# Patient Record
Sex: Female | Born: 1980
Health system: Southern US, Community
[De-identification: ages and names within clinical notes are randomized; demographics above are authoritative.]

## PROBLEM LIST (undated history)

## (undated) DIAGNOSIS — M545 Low back pain, unspecified: Secondary | ICD-10-CM

## (undated) DIAGNOSIS — F419 Anxiety disorder, unspecified: Secondary | ICD-10-CM

## (undated) DIAGNOSIS — M2240 Chondromalacia patellae, unspecified knee: Secondary | ICD-10-CM

## (undated) DIAGNOSIS — G894 Chronic pain syndrome: Secondary | ICD-10-CM

## (undated) DIAGNOSIS — G8929 Other chronic pain: Secondary | ICD-10-CM

## (undated) DIAGNOSIS — Q762 Congenital spondylolisthesis: Secondary | ICD-10-CM

## (undated) DIAGNOSIS — M4306 Spondylolysis, lumbar region: Secondary | ICD-10-CM

## (undated) DIAGNOSIS — F32A Depression, unspecified: Secondary | ICD-10-CM

## (undated) DIAGNOSIS — F329 Major depressive disorder, single episode, unspecified: Secondary | ICD-10-CM

## (undated) DIAGNOSIS — R102 Pelvic and perineal pain: Secondary | ICD-10-CM

## (undated) DIAGNOSIS — R51 Headache: Secondary | ICD-10-CM

## (undated) DIAGNOSIS — M4802 Spinal stenosis, cervical region: Secondary | ICD-10-CM

## (undated) HISTORY — DX: Other chronic pain: G89.29

## (undated) HISTORY — DX: Chondromalacia patellae, unspecified knee: M22.40

## (undated) HISTORY — DX: Spondylolysis, lumbar region: M43.06

## (undated) HISTORY — DX: Anxiety disorder, unspecified: F41.9

## (undated) HISTORY — DX: Headache: R51

## (undated) HISTORY — DX: Depression, unspecified: F32.A

## (undated) HISTORY — DX: Pelvic and perineal pain: R10.2

## (undated) HISTORY — PX: TUBAL LIGATION: SHX77

## (undated) HISTORY — DX: Major depressive disorder, single episode, unspecified: F32.9

## (undated) HISTORY — DX: Chronic pain syndrome: G89.4

## (undated) HISTORY — PX: MOUTH SURGERY: SHX715

## (undated) HISTORY — DX: Spinal stenosis, cervical region: M48.02

## (undated) HISTORY — DX: Low back pain: M54.5

## (undated) HISTORY — DX: Congenital spondylolisthesis: Q76.2

## (undated) HISTORY — PX: TONSILLECTOMY: SUR1361

## (undated) HISTORY — DX: Low back pain, unspecified: M54.50

---
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
## 1999-02-17 ENCOUNTER — Encounter: Admission: RE | Admit: 1999-02-17 | Discharge: 1999-02-17 | Payer: Self-pay | Admitting: Family Medicine

## 1999-03-03 ENCOUNTER — Other Ambulatory Visit: Admission: RE | Admit: 1999-03-03 | Discharge: 1999-03-03 | Payer: Self-pay | Admitting: Family Medicine

## 1999-03-03 ENCOUNTER — Encounter: Admission: RE | Admit: 1999-03-03 | Discharge: 1999-03-03 | Payer: Self-pay | Admitting: Pediatrics

## 1999-09-11 ENCOUNTER — Emergency Department (HOSPITAL_COMMUNITY): Admission: EM | Admit: 1999-09-11 | Discharge: 1999-09-11 | Payer: Self-pay | Admitting: Emergency Medicine

## 1999-09-11 ENCOUNTER — Encounter: Payer: Self-pay | Admitting: Emergency Medicine

## 1999-10-07 ENCOUNTER — Encounter: Admission: RE | Admit: 1999-10-07 | Discharge: 1999-10-07 | Payer: Self-pay | Admitting: Sports Medicine

## 1999-11-07 ENCOUNTER — Ambulatory Visit (HOSPITAL_COMMUNITY): Admission: RE | Admit: 1999-11-07 | Discharge: 1999-11-07 | Payer: Self-pay | Admitting: Chiropractic Medicine

## 1999-11-07 ENCOUNTER — Encounter: Payer: Self-pay | Admitting: Chiropractic Medicine

## 1999-12-11 ENCOUNTER — Encounter: Admission: RE | Admit: 1999-12-11 | Discharge: 1999-12-11 | Payer: Self-pay | Admitting: Family Medicine

## 2000-01-15 ENCOUNTER — Encounter: Admission: RE | Admit: 2000-01-15 | Discharge: 2000-01-15 | Payer: Self-pay | Admitting: Family Medicine

## 2000-01-29 ENCOUNTER — Encounter: Admission: RE | Admit: 2000-01-29 | Discharge: 2000-01-29 | Payer: Self-pay | Admitting: Family Medicine

## 2000-01-29 ENCOUNTER — Other Ambulatory Visit: Admission: RE | Admit: 2000-01-29 | Discharge: 2000-01-29 | Payer: Self-pay | Admitting: Family Medicine

## 2000-04-02 ENCOUNTER — Encounter: Admission: RE | Admit: 2000-04-02 | Discharge: 2000-04-02 | Payer: Self-pay | Admitting: Family Medicine

## 2000-06-17 ENCOUNTER — Encounter: Admission: RE | Admit: 2000-06-17 | Discharge: 2000-06-17 | Payer: Self-pay | Admitting: Family Medicine

## 2001-06-10 ENCOUNTER — Encounter: Admission: RE | Admit: 2001-06-10 | Discharge: 2001-06-10 | Payer: Self-pay | Admitting: Family Medicine

## 2001-06-27 ENCOUNTER — Encounter: Admission: RE | Admit: 2001-06-27 | Discharge: 2001-06-27 | Payer: Self-pay | Admitting: Family Medicine

## 2001-08-01 ENCOUNTER — Encounter: Admission: RE | Admit: 2001-08-01 | Discharge: 2001-08-01 | Payer: Self-pay | Admitting: Family Medicine

## 2001-08-01 ENCOUNTER — Other Ambulatory Visit: Admission: RE | Admit: 2001-08-01 | Discharge: 2001-08-01 | Payer: Self-pay | Admitting: Family Medicine

## 2001-10-24 ENCOUNTER — Encounter: Admission: RE | Admit: 2001-10-24 | Discharge: 2001-10-24 | Payer: Self-pay | Admitting: Sports Medicine

## 2002-06-12 ENCOUNTER — Encounter: Admission: RE | Admit: 2002-06-12 | Discharge: 2002-06-12 | Payer: Self-pay | Admitting: Family Medicine

## 2002-10-19 ENCOUNTER — Encounter (INDEPENDENT_AMBULATORY_CARE_PROVIDER_SITE_OTHER): Payer: Self-pay | Admitting: *Deleted

## 2002-10-19 LAB — CONVERTED CEMR LAB

## 2002-10-30 ENCOUNTER — Encounter: Admission: RE | Admit: 2002-10-30 | Discharge: 2002-10-30 | Payer: Self-pay | Admitting: Family Medicine

## 2002-10-30 ENCOUNTER — Encounter: Payer: Self-pay | Admitting: Family Medicine

## 2002-11-02 ENCOUNTER — Encounter: Payer: Self-pay | Admitting: Family Medicine

## 2002-11-02 ENCOUNTER — Ambulatory Visit (HOSPITAL_COMMUNITY): Admission: RE | Admit: 2002-11-02 | Discharge: 2002-11-02 | Payer: Self-pay | Admitting: Family Medicine

## 2003-02-23 ENCOUNTER — Encounter: Admission: RE | Admit: 2003-02-23 | Discharge: 2003-02-23 | Payer: Self-pay | Admitting: Sports Medicine

## 2003-03-09 ENCOUNTER — Encounter: Admission: RE | Admit: 2003-03-09 | Discharge: 2003-03-09 | Payer: Self-pay | Admitting: Sports Medicine

## 2003-07-31 ENCOUNTER — Encounter: Admission: RE | Admit: 2003-07-31 | Discharge: 2003-07-31 | Payer: Self-pay | Admitting: Sports Medicine

## 2003-09-07 ENCOUNTER — Encounter: Admission: RE | Admit: 2003-09-07 | Discharge: 2003-09-07 | Payer: Self-pay | Admitting: Sports Medicine

## 2003-09-10 ENCOUNTER — Ambulatory Visit (HOSPITAL_COMMUNITY): Admission: RE | Admit: 2003-09-10 | Discharge: 2003-09-10 | Payer: Self-pay | Admitting: Family Medicine

## 2003-10-25 ENCOUNTER — Other Ambulatory Visit: Admission: RE | Admit: 2003-10-25 | Discharge: 2003-10-25 | Payer: Self-pay | Admitting: Obstetrics and Gynecology

## 2003-10-26 ENCOUNTER — Other Ambulatory Visit: Admission: RE | Admit: 2003-10-26 | Discharge: 2003-10-26 | Payer: Self-pay | Admitting: Obstetrics and Gynecology

## 2004-03-11 ENCOUNTER — Other Ambulatory Visit: Admission: RE | Admit: 2004-03-11 | Discharge: 2004-03-11 | Payer: Self-pay | Admitting: Obstetrics and Gynecology

## 2004-05-04 ENCOUNTER — Inpatient Hospital Stay (HOSPITAL_COMMUNITY): Admission: AD | Admit: 2004-05-04 | Discharge: 2004-05-04 | Payer: Self-pay | Admitting: Obstetrics & Gynecology

## 2004-05-20 ENCOUNTER — Inpatient Hospital Stay (HOSPITAL_COMMUNITY): Admission: AD | Admit: 2004-05-20 | Discharge: 2004-05-20 | Payer: Self-pay | Admitting: Obstetrics and Gynecology

## 2004-05-23 ENCOUNTER — Inpatient Hospital Stay (HOSPITAL_COMMUNITY): Admission: AD | Admit: 2004-05-23 | Discharge: 2004-05-25 | Payer: Self-pay | Admitting: Obstetrics and Gynecology

## 2005-10-15 ENCOUNTER — Ambulatory Visit: Payer: Self-pay | Admitting: Family Medicine

## 2006-03-01 ENCOUNTER — Emergency Department (HOSPITAL_COMMUNITY): Admission: EM | Admit: 2006-03-01 | Discharge: 2006-03-01 | Payer: Self-pay | Admitting: Family Medicine

## 2006-03-09 ENCOUNTER — Ambulatory Visit: Payer: Self-pay | Admitting: Family Medicine

## 2006-06-17 DIAGNOSIS — M5382 Other specified dorsopathies, cervical region: Secondary | ICD-10-CM | POA: Insufficient documentation

## 2006-06-17 DIAGNOSIS — F17200 Nicotine dependence, unspecified, uncomplicated: Secondary | ICD-10-CM | POA: Insufficient documentation

## 2006-06-17 DIAGNOSIS — F329 Major depressive disorder, single episode, unspecified: Secondary | ICD-10-CM | POA: Insufficient documentation

## 2006-06-18 ENCOUNTER — Encounter (INDEPENDENT_AMBULATORY_CARE_PROVIDER_SITE_OTHER): Payer: Self-pay | Admitting: *Deleted

## 2006-06-22 ENCOUNTER — Telehealth: Payer: Self-pay | Admitting: *Deleted

## 2007-01-28 ENCOUNTER — Ambulatory Visit: Payer: Self-pay | Admitting: Family Medicine

## 2007-01-28 DIAGNOSIS — R0789 Other chest pain: Secondary | ICD-10-CM | POA: Insufficient documentation

## 2007-01-28 DIAGNOSIS — F4310 Post-traumatic stress disorder, unspecified: Secondary | ICD-10-CM | POA: Insufficient documentation

## 2007-02-18 ENCOUNTER — Encounter: Payer: Self-pay | Admitting: Family Medicine

## 2007-04-04 ENCOUNTER — Telehealth: Payer: Self-pay | Admitting: *Deleted

## 2007-05-20 ENCOUNTER — Ambulatory Visit: Payer: Self-pay | Admitting: Family Medicine

## 2007-09-22 ENCOUNTER — Inpatient Hospital Stay (HOSPITAL_COMMUNITY): Admission: AD | Admit: 2007-09-22 | Discharge: 2007-09-23 | Payer: Self-pay | Admitting: Gynecology

## 2007-12-10 ENCOUNTER — Inpatient Hospital Stay (HOSPITAL_COMMUNITY): Admission: AD | Admit: 2007-12-10 | Discharge: 2007-12-11 | Payer: Self-pay | Admitting: Obstetrics & Gynecology

## 2008-01-26 ENCOUNTER — Emergency Department (HOSPITAL_BASED_OUTPATIENT_CLINIC_OR_DEPARTMENT_OTHER): Admission: EM | Admit: 2008-01-26 | Discharge: 2008-01-26 | Payer: Self-pay | Admitting: Emergency Medicine

## 2008-01-28 ENCOUNTER — Emergency Department (HOSPITAL_BASED_OUTPATIENT_CLINIC_OR_DEPARTMENT_OTHER): Admission: EM | Admit: 2008-01-28 | Discharge: 2008-01-28 | Payer: Self-pay | Admitting: Emergency Medicine

## 2008-02-29 ENCOUNTER — Inpatient Hospital Stay (HOSPITAL_COMMUNITY): Admission: AD | Admit: 2008-02-29 | Discharge: 2008-03-03 | Payer: Self-pay | Admitting: Obstetrics and Gynecology

## 2008-02-29 ENCOUNTER — Encounter (INDEPENDENT_AMBULATORY_CARE_PROVIDER_SITE_OTHER): Payer: Self-pay | Admitting: Obstetrics and Gynecology

## 2008-04-03 ENCOUNTER — Encounter: Payer: Self-pay | Admitting: Family Medicine

## 2008-04-20 LAB — CONVERTED CEMR LAB: Pap Smear: NORMAL

## 2008-05-21 ENCOUNTER — Emergency Department (HOSPITAL_BASED_OUTPATIENT_CLINIC_OR_DEPARTMENT_OTHER): Admission: EM | Admit: 2008-05-21 | Discharge: 2008-05-22 | Payer: Self-pay | Admitting: Emergency Medicine

## 2008-06-10 ENCOUNTER — Emergency Department (HOSPITAL_BASED_OUTPATIENT_CLINIC_OR_DEPARTMENT_OTHER): Admission: EM | Admit: 2008-06-10 | Discharge: 2008-06-10 | Payer: Self-pay | Admitting: Emergency Medicine

## 2008-08-04 ENCOUNTER — Emergency Department (HOSPITAL_BASED_OUTPATIENT_CLINIC_OR_DEPARTMENT_OTHER): Admission: EM | Admit: 2008-08-04 | Discharge: 2008-08-04 | Payer: Self-pay | Admitting: Emergency Medicine

## 2008-08-14 ENCOUNTER — Emergency Department (HOSPITAL_COMMUNITY): Admission: EM | Admit: 2008-08-14 | Discharge: 2008-08-14 | Payer: Self-pay | Admitting: Emergency Medicine

## 2008-08-23 ENCOUNTER — Telehealth: Payer: Self-pay | Admitting: *Deleted

## 2008-08-24 ENCOUNTER — Emergency Department (HOSPITAL_BASED_OUTPATIENT_CLINIC_OR_DEPARTMENT_OTHER): Admission: EM | Admit: 2008-08-24 | Discharge: 2008-08-24 | Payer: Self-pay | Admitting: Emergency Medicine

## 2008-10-04 ENCOUNTER — Emergency Department (HOSPITAL_BASED_OUTPATIENT_CLINIC_OR_DEPARTMENT_OTHER): Admission: EM | Admit: 2008-10-04 | Discharge: 2008-10-05 | Payer: Self-pay | Admitting: Emergency Medicine

## 2008-10-05 ENCOUNTER — Ambulatory Visit: Payer: Self-pay | Admitting: Diagnostic Radiology

## 2008-11-23 ENCOUNTER — Telehealth: Payer: Self-pay | Admitting: Family Medicine

## 2008-11-23 ENCOUNTER — Ambulatory Visit: Payer: Self-pay | Admitting: Family Medicine

## 2008-11-23 DIAGNOSIS — R109 Unspecified abdominal pain: Secondary | ICD-10-CM | POA: Insufficient documentation

## 2008-12-20 ENCOUNTER — Emergency Department (HOSPITAL_COMMUNITY): Admission: EM | Admit: 2008-12-20 | Discharge: 2008-12-20 | Payer: Self-pay | Admitting: Family Medicine

## 2008-12-26 ENCOUNTER — Ambulatory Visit: Payer: Self-pay | Admitting: Family Medicine

## 2008-12-26 DIAGNOSIS — M545 Low back pain, unspecified: Secondary | ICD-10-CM | POA: Insufficient documentation

## 2008-12-31 ENCOUNTER — Telehealth: Payer: Self-pay | Admitting: Family Medicine

## 2009-01-01 ENCOUNTER — Emergency Department (HOSPITAL_BASED_OUTPATIENT_CLINIC_OR_DEPARTMENT_OTHER): Admission: EM | Admit: 2009-01-01 | Discharge: 2009-01-02 | Payer: Self-pay | Admitting: Emergency Medicine

## 2009-01-03 ENCOUNTER — Emergency Department (HOSPITAL_BASED_OUTPATIENT_CLINIC_OR_DEPARTMENT_OTHER): Admission: EM | Admit: 2009-01-03 | Discharge: 2009-01-03 | Payer: Self-pay | Admitting: Emergency Medicine

## 2009-01-03 ENCOUNTER — Ambulatory Visit: Payer: Self-pay | Admitting: Radiology

## 2009-01-04 ENCOUNTER — Ambulatory Visit: Payer: Self-pay | Admitting: Family Medicine

## 2009-01-04 ENCOUNTER — Encounter: Admission: RE | Admit: 2009-01-04 | Discharge: 2009-01-04 | Payer: Self-pay | Admitting: Family Medicine

## 2009-01-07 ENCOUNTER — Telehealth (INDEPENDENT_AMBULATORY_CARE_PROVIDER_SITE_OTHER): Payer: Self-pay | Admitting: *Deleted

## 2009-01-08 ENCOUNTER — Telehealth (INDEPENDENT_AMBULATORY_CARE_PROVIDER_SITE_OTHER): Payer: Self-pay | Admitting: *Deleted

## 2009-01-11 ENCOUNTER — Telehealth (INDEPENDENT_AMBULATORY_CARE_PROVIDER_SITE_OTHER): Payer: Self-pay | Admitting: *Deleted

## 2009-01-11 ENCOUNTER — Encounter: Payer: Self-pay | Admitting: Family Medicine

## 2009-01-11 ENCOUNTER — Ambulatory Visit (HOSPITAL_COMMUNITY): Admission: RE | Admit: 2009-01-11 | Discharge: 2009-01-11 | Payer: Self-pay | Admitting: Family Medicine

## 2009-01-11 DIAGNOSIS — G959 Disease of spinal cord, unspecified: Secondary | ICD-10-CM | POA: Insufficient documentation

## 2009-01-22 ENCOUNTER — Telehealth (INDEPENDENT_AMBULATORY_CARE_PROVIDER_SITE_OTHER): Payer: Self-pay | Admitting: *Deleted

## 2009-01-31 ENCOUNTER — Encounter (INDEPENDENT_AMBULATORY_CARE_PROVIDER_SITE_OTHER): Payer: Self-pay

## 2009-02-07 ENCOUNTER — Telehealth: Payer: Self-pay | Admitting: Family Medicine

## 2009-02-11 ENCOUNTER — Telehealth: Payer: Self-pay | Admitting: Family Medicine

## 2009-03-05 ENCOUNTER — Encounter: Payer: Self-pay | Admitting: Family Medicine

## 2009-03-28 ENCOUNTER — Telehealth: Payer: Self-pay | Admitting: Family Medicine

## 2009-03-28 ENCOUNTER — Ambulatory Visit: Payer: Self-pay | Admitting: Family Medicine

## 2009-03-28 DIAGNOSIS — N390 Urinary tract infection, site not specified: Secondary | ICD-10-CM | POA: Insufficient documentation

## 2009-03-28 LAB — CONVERTED CEMR LAB
Beta hcg, urine, semiquantitative: NEGATIVE
Bilirubin Urine: NEGATIVE
Blood in Urine, dipstick: NEGATIVE
Epithelial cells, urine: >20 /lpf
Glucose, Urine, Semiquant: NEGATIVE
Ketones, urine, test strip: NEGATIVE
Nitrite: NEGATIVE
Protein, U semiquant: NEGATIVE
Specific Gravity, Urine: 1.01
Urobilinogen, UA: 0.2
pH: 6

## 2009-03-29 ENCOUNTER — Encounter: Payer: Self-pay | Admitting: Family Medicine

## 2009-03-29 ENCOUNTER — Encounter: Payer: Self-pay | Admitting: *Deleted

## 2009-04-01 ENCOUNTER — Ambulatory Visit (HOSPITAL_COMMUNITY): Admission: RE | Admit: 2009-04-01 | Discharge: 2009-04-01 | Payer: Self-pay | Admitting: Family Medicine

## 2009-04-01 ENCOUNTER — Encounter: Payer: Self-pay | Admitting: *Deleted

## 2009-04-02 ENCOUNTER — Telehealth: Payer: Self-pay | Admitting: Family Medicine

## 2009-04-27 ENCOUNTER — Emergency Department (HOSPITAL_BASED_OUTPATIENT_CLINIC_OR_DEPARTMENT_OTHER): Admission: EM | Admit: 2009-04-27 | Discharge: 2009-04-27 | Payer: Self-pay | Admitting: Emergency Medicine

## 2009-05-03 ENCOUNTER — Telehealth: Payer: Self-pay | Admitting: Family Medicine

## 2009-05-08 ENCOUNTER — Ambulatory Visit: Payer: Self-pay | Admitting: Family Medicine

## 2009-05-08 DIAGNOSIS — B373 Candidiasis of vulva and vagina: Secondary | ICD-10-CM | POA: Insufficient documentation

## 2009-05-08 LAB — CONVERTED CEMR LAB: Whiff Test: POSITIVE

## 2009-05-16 ENCOUNTER — Encounter: Payer: Self-pay | Admitting: Family Medicine

## 2009-05-16 DIAGNOSIS — M47817 Spondylosis without myelopathy or radiculopathy, lumbosacral region: Secondary | ICD-10-CM | POA: Insufficient documentation

## 2009-05-20 ENCOUNTER — Telehealth: Payer: Self-pay | Admitting: Family Medicine

## 2009-05-23 ENCOUNTER — Encounter
Admission: RE | Admit: 2009-05-23 | Discharge: 2009-08-21 | Payer: Self-pay | Admitting: Physical Medicine & Rehabilitation

## 2009-05-23 ENCOUNTER — Telehealth: Payer: Self-pay | Admitting: *Deleted

## 2009-05-27 ENCOUNTER — Telehealth: Payer: Self-pay | Admitting: Family Medicine

## 2009-05-30 ENCOUNTER — Ambulatory Visit: Payer: Self-pay | Admitting: Physical Medicine & Rehabilitation

## 2009-06-12 ENCOUNTER — Encounter
Admission: RE | Admit: 2009-06-12 | Discharge: 2009-07-09 | Payer: Self-pay | Admitting: Physical Medicine & Rehabilitation

## 2009-06-12 ENCOUNTER — Encounter: Payer: Self-pay | Admitting: *Deleted

## 2009-06-27 ENCOUNTER — Ambulatory Visit: Payer: Self-pay | Admitting: Physical Medicine & Rehabilitation

## 2009-07-03 ENCOUNTER — Emergency Department (HOSPITAL_BASED_OUTPATIENT_CLINIC_OR_DEPARTMENT_OTHER): Admission: EM | Admit: 2009-07-03 | Discharge: 2009-07-03 | Payer: Self-pay | Admitting: Emergency Medicine

## 2009-07-26 ENCOUNTER — Emergency Department (HOSPITAL_BASED_OUTPATIENT_CLINIC_OR_DEPARTMENT_OTHER): Admission: EM | Admit: 2009-07-26 | Discharge: 2009-07-29 | Payer: Self-pay | Admitting: Emergency Medicine

## 2009-07-29 ENCOUNTER — Ambulatory Visit: Payer: Self-pay | Admitting: Physical Medicine & Rehabilitation

## 2009-07-29 ENCOUNTER — Telehealth: Payer: Self-pay | Admitting: Family Medicine

## 2009-08-14 ENCOUNTER — Ambulatory Visit: Payer: Self-pay | Admitting: Family Medicine

## 2009-08-21 ENCOUNTER — Telehealth: Payer: Self-pay | Admitting: *Deleted

## 2009-09-04 ENCOUNTER — Other Ambulatory Visit: Admission: RE | Admit: 2009-09-04 | Discharge: 2009-09-04 | Payer: Self-pay | Admitting: Family Medicine

## 2009-09-04 ENCOUNTER — Ambulatory Visit: Payer: Self-pay | Admitting: Family Medicine

## 2009-09-04 DIAGNOSIS — N760 Acute vaginitis: Secondary | ICD-10-CM | POA: Insufficient documentation

## 2009-09-05 LAB — CONVERTED CEMR LAB
Chlamydia, DNA Probe: NEGATIVE
GC Probe Amp, Genital: NEGATIVE

## 2009-09-10 LAB — CONVERTED CEMR LAB: Pap Smear: NEGATIVE

## 2009-09-13 ENCOUNTER — Telehealth: Payer: Self-pay | Admitting: Family Medicine

## 2009-09-25 ENCOUNTER — Telehealth: Payer: Self-pay | Admitting: Family Medicine

## 2009-10-22 ENCOUNTER — Telehealth: Payer: Self-pay | Admitting: Family Medicine

## 2009-11-04 ENCOUNTER — Telehealth: Payer: Self-pay | Admitting: Family Medicine

## 2009-11-04 ENCOUNTER — Ambulatory Visit: Payer: Self-pay | Admitting: Family Medicine

## 2009-11-05 ENCOUNTER — Encounter: Payer: Self-pay | Admitting: *Deleted

## 2009-11-08 ENCOUNTER — Ambulatory Visit: Payer: Self-pay | Admitting: Diagnostic Radiology

## 2009-11-08 ENCOUNTER — Emergency Department (HOSPITAL_BASED_OUTPATIENT_CLINIC_OR_DEPARTMENT_OTHER): Admission: EM | Admit: 2009-11-08 | Discharge: 2009-11-08 | Payer: Self-pay | Admitting: Emergency Medicine

## 2009-11-11 ENCOUNTER — Ambulatory Visit: Payer: Self-pay | Admitting: Family Medicine

## 2009-11-11 ENCOUNTER — Telehealth: Payer: Self-pay | Admitting: Family Medicine

## 2009-11-14 ENCOUNTER — Ambulatory Visit (HOSPITAL_COMMUNITY): Admission: RE | Admit: 2009-11-14 | Discharge: 2009-11-14 | Payer: Self-pay | Admitting: Family Medicine

## 2009-11-15 ENCOUNTER — Telehealth: Payer: Self-pay | Admitting: *Deleted

## 2009-11-15 ENCOUNTER — Ambulatory Visit: Payer: Self-pay | Admitting: Family Medicine

## 2009-11-15 DIAGNOSIS — M2240 Chondromalacia patellae, unspecified knee: Secondary | ICD-10-CM | POA: Insufficient documentation

## 2009-11-22 ENCOUNTER — Ambulatory Visit: Payer: Self-pay | Admitting: Family Medicine

## 2009-11-22 DIAGNOSIS — G43809 Other migraine, not intractable, without status migrainosus: Secondary | ICD-10-CM | POA: Insufficient documentation

## 2009-11-29 ENCOUNTER — Telehealth: Payer: Self-pay | Admitting: Family Medicine

## 2009-12-02 ENCOUNTER — Encounter: Payer: Self-pay | Admitting: Family Medicine

## 2009-12-12 ENCOUNTER — Emergency Department (HOSPITAL_BASED_OUTPATIENT_CLINIC_OR_DEPARTMENT_OTHER): Admission: EM | Admit: 2009-12-12 | Discharge: 2009-12-13 | Payer: Self-pay | Admitting: Emergency Medicine

## 2009-12-12 ENCOUNTER — Ambulatory Visit: Payer: Self-pay | Admitting: Diagnostic Radiology

## 2009-12-16 ENCOUNTER — Telehealth: Payer: Self-pay | Admitting: *Deleted

## 2010-01-16 ENCOUNTER — Telehealth: Payer: Self-pay | Admitting: Family Medicine

## 2010-01-27 ENCOUNTER — Telehealth: Payer: Self-pay | Admitting: Family Medicine

## 2010-02-25 ENCOUNTER — Telehealth: Payer: Self-pay | Admitting: Family Medicine

## 2010-03-20 ENCOUNTER — Telehealth: Payer: Self-pay | Admitting: Family Medicine

## 2010-04-13 ENCOUNTER — Emergency Department (HOSPITAL_BASED_OUTPATIENT_CLINIC_OR_DEPARTMENT_OTHER)
Admission: EM | Admit: 2010-04-13 | Discharge: 2010-04-13 | Payer: Self-pay | Source: Home / Self Care | Admitting: Emergency Medicine

## 2010-04-15 ENCOUNTER — Telehealth: Payer: Self-pay | Admitting: Family Medicine

## 2010-04-22 ENCOUNTER — Telehealth: Payer: Self-pay | Admitting: Family Medicine

## 2010-04-23 ENCOUNTER — Encounter
Admission: RE | Admit: 2010-04-23 | Discharge: 2010-04-23 | Payer: Self-pay | Source: Home / Self Care | Attending: Neurosurgery | Admitting: Neurosurgery

## 2010-04-25 ENCOUNTER — Encounter: Payer: Self-pay | Admitting: Family Medicine

## 2010-05-05 ENCOUNTER — Emergency Department (HOSPITAL_BASED_OUTPATIENT_CLINIC_OR_DEPARTMENT_OTHER)
Admission: EM | Admit: 2010-05-05 | Discharge: 2010-05-05 | Payer: Self-pay | Source: Home / Self Care | Admitting: Emergency Medicine

## 2010-05-08 ENCOUNTER — Ambulatory Visit (HOSPITAL_COMMUNITY): Admission: RE | Admit: 2010-05-08 | Payer: Self-pay | Source: Home / Self Care | Admitting: Neurosurgery

## 2010-05-11 ENCOUNTER — Encounter: Payer: Self-pay | Admitting: Family Medicine

## 2010-05-14 ENCOUNTER — Telehealth: Payer: Self-pay | Admitting: *Deleted

## 2010-05-15 ENCOUNTER — Emergency Department (HOSPITAL_BASED_OUTPATIENT_CLINIC_OR_DEPARTMENT_OTHER)
Admission: EM | Admit: 2010-05-15 | Discharge: 2010-05-15 | Payer: Self-pay | Source: Home / Self Care | Admitting: Emergency Medicine

## 2010-05-20 ENCOUNTER — Encounter: Admission: RE | Admit: 2010-05-20 | Payer: Self-pay | Source: Home / Self Care | Admitting: Neurosurgery

## 2010-05-20 NOTE — Progress Notes (Signed)
Summary: refill  Phone Note Call from Patient Call back at [phone]   Caller: Patient Summary of Call: was given percocet a while back and wants to know if she can have enough to last until next appt on 4/27  Initial call taken by: De Nurse,  July 29, 2009 3:23 PM  Follow-up for Phone Call        Called and told OK, will need to pick up Rx at front desk. Follow-up by: Doralee Albino MD,  July 30, 2009 10:30 AM    New/Updated Medications: OXYCODONE-ACETAMINOPHEN 5-325 MG TABS (OXYCODONE-ACETAMINOPHEN) 1/2 tab by mouth up to 4 times per day as needed for pain Prescriptions: OXYCODONE-ACETAMINOPHEN 5-325 MG TABS (OXYCODONE-ACETAMINOPHEN) 1/2 tab by mouth up to 4 times per day as needed for pain  #15 x 0   Entered and Authorized by:   Doralee Albino MD   Signed by:   Doralee Albino MD on 07/30/2009   Method used:   Handwritten   RxID:   [id number]

## 2010-05-20 NOTE — Progress Notes (Signed)
Summary: refill  Phone Note Refill Request Call back at [phone] Message from:  Patient  Refills Requested: Medication #1:  OXYCODONE-ACETAMINOPHEN 5-325 MG TABS 1/2 tab by mouth up to 4 times per day as needed for pain pt is going out of town today and will be out of town for a few weeks and wants her meds filled early  Initial call taken by: De Nurse,  Sep 13, 2009 10:16 AM  Follow-up for Phone Call        I will provide handwritten Rx to be place up front.  That Rx will not be able to be filled until 10/04/09.  Please notify patient. Follow-up by: Doralee Albino MD,  Sep 13, 2009 10:31 AM  Additional Follow-up for Phone Call Additional follow up Details #1::        pt notified, voiced understanding Additional Follow-up by: Gladstone Pih,  Sep 13, 2009 11:34 AM    Prescriptions: OXYCODONE-ACETAMINOPHEN 5-325 MG TABS (OXYCODONE-ACETAMINOPHEN) 1/2 tab by mouth up to 4 times per day as needed for pain  #45 x 0   Entered and Authorized by:   Doralee Albino MD   Signed by:   Doralee Albino MD on 09/13/2009   Method used:   Handwritten   RxID:   [id number]

## 2010-05-20 NOTE — Progress Notes (Signed)
Summary: need meds  Phone Note Call from Patient Call back at [phone]   Caller: Patient Summary of Call: has a bulging disc and would like something for pain [facility] [address] Initial call taken by: De Nurse,  January 22, 2009 4:32 PM  Follow-up for Phone Call        uses [facility] on [address] . states ibuprofen & flexeril are not helping. message to pcp Follow-up by: Golden Circle RN,  January 22, 2009 4:35 PM  Additional Follow-up for Phone Call Additional follow up Details #1::        Called and discussed.  Needs NS referral.  Some arm tingling.  No leg tingling or weakness.  No bowel or bladder problems.  Will proceed with referral.  Pain med rx faxed. Additional Follow-up by: Doralee Albino MD,  January 22, 2009 5:07 PM    New/Updated Medications: HYDROCODONE-ACETAMINOPHEN 5-500 MG TABS (HYDROCODONE-ACETAMINOPHEN) one by mouth q6h as needed pain Prescriptions: HYDROCODONE-ACETAMINOPHEN 5-500 MG TABS (HYDROCODONE-ACETAMINOPHEN) one by mouth q6h as needed pain  #60 x 0   Entered and Authorized by:   Doralee Albino MD   Signed by:   Doralee Albino MD on 01/22/2009   Method used:   Printed then faxed to ...       [facility] [id number]* (retail)       [address]       [place], [place]  [address]       Ph: [phone]       Fax: [phone]   RxID:   [id number]  faxed. Lillia Pauls CMA  January 23, 2009 4:53 PM

## 2010-05-20 NOTE — Progress Notes (Signed)
  Phone Note Call from Patient   Caller: Patient Call For: [phone] Summary of Call: Calling regarding her meds.  Was informed by her mom to call and speak with you Initial call taken by: Abundio Miu,  January 27, 2010 10:41 AM  Follow-up for Phone Call        Called.  Hydrocodone causes headache (already on allergy list for N&V)  Expreessed concerns about long term narcotics.  Switch to percocet at 45 per month.  Also arrange Pt for worsening neck pain.  Order entered. She is also considering neck surgery Follow-up by: Doralee Albino MD,  January 28, 2010 12:10 PM  Additional Follow-up for Phone Call Additional follow up Details #1::        Spoke with patient and informed her that i was faxing referral to [facility] OP rehab Additional Follow-up by: Jimmy Footman, CMA,  January 28, 2010 12:17 PM    New/Updated Medications: OXYCODONE-ACETAMINOPHEN 5-325 MG TABS (OXYCODONE-ACETAMINOPHEN) one by mouth daily.  Must last one month Prescriptions: OXYCODONE-ACETAMINOPHEN 5-325 MG TABS (OXYCODONE-ACETAMINOPHEN) one by mouth daily.  Must last one month  #45 x 0   Entered and Authorized by:   Doralee Albino MD   Signed by:   Doralee Albino MD on 01/28/2010   Method used:   Handwritten   RxID:   [id number]

## 2010-05-20 NOTE — Assessment & Plan Note (Signed)
Summary: depression and anxiety/lgk   Vital Signs:  Patient profile:   30 year old female Weight:      123.9 pounds BMI:     21.85 Temp:     97.6 degrees F oral Pulse rate:   93 / minute BP sitting:   123 / 76  (left arm) Cuff size:   regular  Vitals Entered By: Loralee Pacas CMA (May 08, 2009 1:35 PM) Comments hx depression and anxiety, thinks that she has a vaginal infection, back pain started saturday.     Primary Care Provider:  Doralee Albino MD   History of Present Illness: Multiple problems: all chronic Depression and anxiety.  Single parent with four children, finances tight.  No SI/HI.  I have tried multiple antidepressants in the past and they either don't work or she does not give them a fair trial.  Not taking anything currently Chronic neck pain with occaisional tingling in both arms.  Being followed by neurosurg for spondylolosis and HNP with cord indentation. Chronic back pain, no radiation.  [facility]  Told had pars defect in lumbar spine too. Vag discharge. pt declined flu shot allergy to eggs.Loralee Pacas CMA  May 08, 2009 1:38 PM  Current Medications (verified): 1)  Ibuprofen 800 Mg Tabs (Ibuprofen) .... One By Mouth Three Times A Day For Back Pain 2)  Tylenol Extra Strength 500 Mg Tabs (Acetaminophen) .... 2 Tablets By Mouth Schedule Every 8 Hours 3)  Alprazolam 0.5 Mg Tabs (Alprazolam) .... One By Mouth Two Times A Day As Needed Anxiety 4)  Hydrocodone-Acetaminophen 5-500 Mg Tabs (Hydrocodone-Acetaminophen) .... Pne By Mouth Q6h As Needed Pain 5)  Metronidazole 500 Mg  Tabs (Metronidazole) .... One By Mouth Two Times A Day  Allergies (verified): 1)  ! Metronidazole (Metronidazole)  Past History:  Past medical, surgical, family and social histories (including risk factors) reviewed, and no changes noted (except as noted below).  Past Medical History: Reviewed history from 06/17/2006 and no changes required. recurrent  bacterial vaginosis  Family History: Reviewed history from 06/17/2006 and no changes required. Mother is Olesya Wike, grandmother is Reatha Harps, Was with father of children from 66 until split in 2003.  They have two children: Revonda Standard born 2000 and lives with [place] and [place] born 1998 who lives with ex Verdon Cummins) and Jesse`s mother.  Social History: Reviewed history from 03/28/2009 and no changes required. smokes 1/2 ppd; four children born 59 and 2000  2006;2009 has not completed high school working on GED; lives daughter, Revonda Standard, and a female roommate; Tiajuana Amass are a big problem Best Contact # [phone] (cell)  Physical Exam  General:  depressed affect Abdomen:  Bowel sounds positive,abdomen soft and non-tender without masses, organomegaly or hernias noted. Msk:  stiffness and limited ROM of neck and lumbar spine.  Normal reflexes and sensation bilaterally in upper and lower extremities.   Impression & Recommendations:  Problem # 1:  NECK PAIN (ICD-723.1) Chronic - Keep FU with NS The following medications were removed from the medication list:    Cyclobenzaprine Hcl 10 Mg Tabs (Cyclobenzaprine hcl) ..... One by mouth at bedtime as needed muscle spasm (back or neck. Her updated medication list for this problem includes:    Ibuprofen 800 Mg Tabs (Ibuprofen) ..... One by mouth three times a day for back pain    Tylenol Extra Strength 500 Mg Tabs (Acetaminophen) .Marland Kitchen... 2 tablets by mouth schedule every 8 hours    Hydrocodone-acetaminophen 5-500 Mg Tabs (Hydrocodone-acetaminophen) .Marland Kitchen... Pne by  mouth q6h as needed pain  Problem # 2:  LOW BACK PAIN (ICD-724.2)  Will get [facility] from [facility] ER visit. The following medications were removed from the medication list:    Cyclobenzaprine Hcl 10 Mg Tabs (Cyclobenzaprine hcl) ..... One by mouth at bedtime as needed muscle spasm (back or neck. Her updated medication list for this problem includes:    Ibuprofen 800 Mg Tabs (Ibuprofen) ..... One  by mouth three times a day for back pain    Tylenol Extra Strength 500 Mg Tabs (Acetaminophen) .Marland Kitchen... 2 tablets by mouth schedule every 8 hours    Hydrocodone-acetaminophen 5-500 Mg Tabs (Hydrocodone-acetaminophen) .Marland Kitchen... Pne by mouth q6h as needed pain  Orders: [facility]- Est  Level 4 ([address])  Problem # 3:  DEPRESSIVE DISORDER, NOS (ICD-311) Assessment: Deteriorated Seems to have a significant somatoform componant with neck, back, chest and pelvic pain Will rx with intermitant benzo since she does not stick with antidepressant rx Her updated medication list for this problem includes:    Alprazolam 0.5 Mg Tabs (Alprazolam) ..... One by mouth two times a day as needed anxiety  Orders: [facility]- Est  Level 4 ([address]214)  Problem # 4:  CANDIDIASIS OF VULVA AND VAGINA (ICD-112.1) Actually, BV Orders: Wet Prep- [facility] (60454)  Complete Medication List: 1)  Ibuprofen 800 Mg Tabs (Ibuprofen) .... One by mouth three times a day for back pain 2)  Tylenol Extra Strength 500 Mg Tabs (Acetaminophen) .... 2 tablets by mouth schedule every 8 hours 3)  Alprazolam 0.5 Mg Tabs (Alprazolam) .... One by mouth two times a day as needed anxiety 4)  Hydrocodone-acetaminophen 5-500 Mg Tabs (Hydrocodone-acetaminophen) .... Pne by mouth q6h as needed pain 5)  Metronidazole 500 Mg Tabs (Metronidazole) .... One by mouth two times a day Prescriptions: METRONIDAZOLE 500 MG  TABS (METRONIDAZOLE) One by mouth two times a day  #14 x 0   Entered and Authorized by:   Doralee Albino MD   Signed by:   Doralee Albino MD on 05/09/2009   Method used:   Electronically to        [facility] [address] [id number] (retail)       [address] [place], [place]  [address]       Ph: [phone]       Fax: [phone]   RxID:   [id number] IBUPROFEN 800 MG TABS (IBUPROFEN) one by mouth three times a day for back pain  #90 x 12   Entered and Authorized by:   Doralee Albino MD   Signed by:   Doralee Albino MD on 05/08/2009   Method used:    Electronically to        [facility] [id number] (retail)       [address] [place], [place]  [address]       Ph: [phone]       Fax: [phone]   RxID:   [id number] ALPRAZOLAM 0.5 MG TABS (ALPRAZOLAM) one by mouth two times a day as needed anxiety  #64 x 3   Entered and Authorized by:   Doralee Albino MD   Signed by:   Doralee Albino MD on 05/08/2009   Method used:   Handwritten   RxID:   [id number] HYDROCODONE-ACETAMINOPHEN 5-500 MG TABS (HYDROCODONE-ACETAMINOPHEN) pne by mouth q6h as needed pain  #40 x 3   Entered and Authorized by:   Doralee Albino MD   Signed by:   Chrissie Noa  Aariyah Sampey MD on 05/08/2009   Method used:   Handwritten   RxID:   [id number]   Laboratory Results  Date/Time Received: May 08, 2009 2:36 PM  Date/Time Reported: May 08, 2009 2:58 PM   [facility] [place] Source: vag WBC/hpf: 1-5 Bacteria/hpf: 3+  Cocci Clue cells/hpf: many  Positive whiff Yeast/hpf: few Trichomonas/hpf: none Comments: ...............test performed by......Marland KitchenBonnie A. [place], MLS (ASCP)cm

## 2010-05-20 NOTE — Progress Notes (Signed)
Summary: Rx Req  Phone Note Refill Request Call back at [phone] Message from:  Patient  Refills Requested: Medication #1:  OXYCODONE-ACETAMINOPHEN 5-325 MG TABS 1/2 tab by mouth up to 4 times per day as needed for pain WONDERING IF SHE CAN GO AHEAD AND COME PICK IT UP SINCE SHE WILL BE IN TOWN?  Initial call taken by: Clydell Hakim,  October 22, 2009 9:42 AM  Follow-up for Phone Call        Done and Rx placed up front. Follow-up by: Doralee Albino MD,  October 22, 2009 2:46 PM    New/Updated Medications: OXYCODONE-ACETAMINOPHEN 5-325 MG TABS (OXYCODONE-ACETAMINOPHEN) 1/2 tab by mouth up to 4 times per day as needed for pain Must last one month Prescriptions: OXYCODONE-ACETAMINOPHEN 5-325 MG TABS (OXYCODONE-ACETAMINOPHEN) 1/2 tab by mouth up to 4 times per day as needed for pain Must last one month  #45 x 0   Entered and Authorized by:   Doralee Albino MD   Signed by:   Doralee Albino MD on 10/22/2009   Method used:   Handwritten   RxID:   [id number]

## 2010-05-20 NOTE — Assessment & Plan Note (Signed)
Summary: pap/kh   Vital Signs:  Patient profile:   30 year old female Height:      63.25 inches Weight:      125.8 pounds BMI:     22.19 Temp:     98.3 degrees F oral Pulse rate:   97 / minute BP sitting:   132 / 81  (left arm) Cuff size:   regular  Vitals Entered By: Gladstone Pih (Sep 04, 2009 3:06 PM) CC: PAP Is Patient Diabetic? No Pain Assessment Patient in pain? no        Primary Care Provider:  Doralee Albino MD  CC:  PAP.  History of Present Illness: Neck pain is chronic and stable..  Had shoulder irritation when on gabapentin.  Stopped and irritation went away.  Advised to restart drug and see if symptoms returned. Needs pap today.  Overdue and had previous abnormal pap Has chronic asymptomatic vag discharge.  Monagomous  Habits & Providers  Alcohol-Tobacco-Diet     Tobacco Status: current     Tobacco Counseling: to quit use of tobacco products     Cigarette Packs/Day: 1.0  Current Medications (verified): 1)  Ibuprofen 800 Mg Tabs (Ibuprofen) .... One By Mouth Three Times A Day For Back Pain 2)  Alprazolam 0.5 Mg Tabs (Alprazolam) .... One By Mouth Two Times A Day As Needed Anxiety 3)  Oxycodone-Acetaminophen 5-325 Mg Tabs (Oxycodone-Acetaminophen) .... 1/2 Tab By Mouth Up To 4 Times Per Day As Needed For Pain 4)  Capsaicin 0.025 % Crea (Capsaicin) .... Apply Daily Otc 5)  Gabapentin 100 Mg Caps (Gabapentin) .... One By Mouth At Bedtime X 1 Week Then One By Mouth Two Times A Day X 1 Week Then 1 By Mouth Three Times A Day Thereafter  Allergies (verified): 1)  ! Metronidazole (Metronidazole) 2)  Hydrocodone  Past History:  Past medical, surgical, family and social histories (including risk factors) reviewed, and no changes noted (except as noted below).  Past Medical History: Reviewed history from 06/17/2006 and no changes required. recurrent bacterial vaginosis  Family History: Reviewed history from 06/17/2006 and no changes required. Mother is  Adalia Pettis, grandmother is Reatha Harps, Was with father of children from 74 until split in 2003.  They have two children: Revonda Standard born 2000 and lives with [place] and [place] born 1998 who lives with ex Verdon Cummins) and Jesse`s mother.  Social History: Reviewed history from 03/28/2009 and no changes required. smokes 1/2 ppd; four children born 43 and 2000  2006;2009 has not completed high school working on [employer]; lives daughter, Revonda Standard, and a female roommate; Tiajuana Amass are a big problem Best Contact # [phone] (cell)  Physical Exam  General:  Well-developed,well-nourished,in no acute distress; alert,appropriate and cooperative throughout examination Abdomen:  Bowel sounds positive,abdomen soft and non-tender without masses, organomegaly or hernias noted. Genitalia:  vag and cervical discharge.  Cervix mildly erythematous.  Uterus and ovaries normal by bimanuel.  Pap taken   Impression & Recommendations:  Problem # 1:  SCREENING FOR MALIGNANT NEOPLASM OF THE CERVIX (ICD-V76.2)  Orders: Pap Smear-[facility] ([address]38-75643) [facility] - Est  18-39 yrs ([address])  Problem # 2:  BACTERIAL VAGINITIS (ICD-616.10)  Orders: Wet Prep- [facility] (88416)  Complete Medication List: 1)  Ibuprofen 800 Mg Tabs (Ibuprofen) .... One by mouth three times a day for back pain 2)  Alprazolam 0.5 Mg Tabs (Alprazolam) .... One by mouth two times a day as needed anxiety 3)  Oxycodone-acetaminophen 5-325 Mg Tabs (Oxycodone-acetaminophen) .... 1/2 tab by mouth up to 4  times per day as needed for pain 4)  Capsaicin 0.025 % Crea (Capsaicin) .... Apply daily otc 5)  Gabapentin 100 Mg Caps (Gabapentin) .... One by mouth at bedtime x 1 week then one by mouth two times a day x 1 week then 1 by mouth three times a day thereafter  Other Orders: GC/Chlamydia-[facility] (87591/87491) Prescriptions: OXYCODONE-ACETAMINOPHEN 5-325 MG TABS (OXYCODONE-ACETAMINOPHEN) 1/2 tab by mouth up to 4 times per day as needed for pain  #45 x 0   Entered and  Authorized by:   Doralee Albino MD   Signed by:   Doralee Albino MD on 09/04/2009   Method used:   Handwritten   RxID:   [id number]    Prevention & Chronic Care Immunizations   Influenza vaccine: Not documented    Tetanus booster: 07/19/2001: Done.    Pneumococcal vaccine: Not documented  Other Screening   Pap smear: Done 1/10 at gyn office.  Reportedly normal.  (04/20/2008)   Pap smear due: 04/2009   Smoking status: current  (09/04/2009)   Appended Document: wet prep    Lab Visit  Laboratory Results  Date/Time Received: Sep 04, 2009 3:45 PM  Date/Time Reported: Sep 04, 2009 4:22 PM   [employer] Source: vag WBC/hpf: >20 Bacteria/hpf: 3+  [place] Clue cells/hpf: none  Negative whiff Yeast/hpf: few Trichomonas/hpf: none Comments: ...............test performed by......Marland KitchenBonnie A. [place], MLS (ASCP)cm   Orders Today:   Appended Document: pap/kh     Allergies: 1)  ! Metronidazole (Metronidazole) 2)  Hydrocodone   Complete Medication List: 1)  Ibuprofen 800 Mg Tabs (Ibuprofen) .... One by mouth three times a day for back pain 2)  Alprazolam 0.5 Mg Tabs (Alprazolam) .... One by mouth two times a day as needed anxiety 3)  Oxycodone-acetaminophen 5-325 Mg Tabs (Oxycodone-acetaminophen) .... 1/2 tab by mouth up to 4 times per day as needed for pain 4)  Capsaicin 0.025 % Crea (Capsaicin) .... Apply daily otc 5)  Gabapentin 100 Mg Caps (Gabapentin) .... One by mouth at bedtime x 1 week then one by mouth two times a day x 1 week then 1 by mouth three times a day thereafter  Other Orders: [facility]- Est  Level 4 ([address]782)

## 2010-05-20 NOTE — Assessment & Plan Note (Signed)
Summary: F/U VISIT/[facility]   Vital Signs:  Patient profile:   30 year old female Height:      63 inches Weight:      128 pounds BMI:     22.76 Temp:     98.6 degrees F Pulse rate:   88 / minute BP sitting:   130 / 79  Vitals Entered By: Golden Circle RN (November 15, 2009 8:40 AM)  Primary Care Provider:  Doralee Albino MD   History of Present Illness: 1. f/u left leg pain / numbness / weakness: - Pt went to ER on 07/22 because of left sided numnbess / weakness.  Had a negative Head CT.  Presented to clinic the next day with persistent left leg numbness / weakness while the face and arm had improved.  Was sent for MRI of lumbar spine and sacrum to evaluate for spinal pathology.  MRI showed a herniated disc at L5 with foraminal encroachment but without foraminal or spinal stenosis.  Today she feels that her left leg is doing much better.  Her numbness is nearly resolved and her strength is improving.  Now her main complaint is left knee pain  ROS: endorses chronic low back pain.  Denies saddle anesthesia, radicular type shooting pain, loss of bowel / bladder function.  2. Left knee pain: - Has been there since 07/22 - Didn't notice it as much when her leg was numb / weak - It is painful in the medial aspect of her knee cap and the patellar tendon - It is rated a 6/10 - It gets worse throughout the day - At the end of the day it is swollen and tight - Percocet and Advil seem to help  ROS: denies knee catching or giving out.  Habits & Providers  Alcohol-Tobacco-Diet     Alcohol drinks/day: 0     Tobacco Status: current     Tobacco Counseling: to quit use of tobacco products     Cigarette Packs/Day: 1.0  Exercise-Depression-Behavior     Drug Use: never     Seat Belt Use: always  Current Medications (verified): 1)  Meloxicam 15 Mg Tabs (Meloxicam) .... Take Half To One Tablet Twice A Day For Pain 2)  Alprazolam 0.5 Mg Tabs (Alprazolam) .... One By Mouth Two Times A Day As Needed  Anxiety 3)  Oxycodone-Acetaminophen 5-325 Mg Tabs (Oxycodone-Acetaminophen) .... 1/2 Tab By Mouth Up To 4 Times Per Day As Needed For Pain Must Last One Month 4)  Capsaicin 0.025 % Crea (Capsaicin) .... Apply Daily Otc 5)  Gabapentin 100 Mg Caps (Gabapentin) .... One By Mouth At Bedtime X 1 Week Then One By Mouth Two Times A Day X 1 Week Then 1 By Mouth Three Times A Day Thereafter 6)  Prednisone 20 Mg Tabs (Prednisone) .... 2 Tabs By Mouth Daily For 5 Days  Allergies: 1)  ! Metronidazole (Metronidazole) 2)  Hydrocodone  Past History:  Past Medical History: recurrent bacterial vaginosis C4-C5 herniated disc being followed by Neurosurgery L5 herniated disc without foraminal / spinal stenosis  Social History: Reviewed history from 03/28/2009 and no changes required. smokes 1/2 ppd; four children born 74 and 2000  2006;2009 has not completed high school working on [employer]; lives daughter, Revonda Standard, and a female roommate; Tiajuana Amass are a big problem Best Contact # [phone] (cell)Seat Belt Use:  always  Physical Exam  General:  Vitals reviewed.  Sitting comfortably on exam table.  No acute distress. Head:  normocephalic and atraumatic.   Eyes:  vision grossly intact.  PERRL.  EOMI.  Fundoscopic exam normal. Neck:  supple, full ROM, and no masses.   Lungs:  normal respiratory effort and normal breath sounds.   Heart:  normal rate, regular rhythm, and no murmur.   Msk:  Left leg:  no swelling, redness, or deformity.  Appears normal compared to the right.  Full ROM of the hip, knee and ankle.  No joint swelling or tenderness.  Left knee: no swelling, redness, or deformity.  TTP at the medial patella and patellar tendon.  Some patellar clicking with leg flexion.  + patellar pain with downward pressure.  Full ROM.  Neg anterior drawer.  Good joint stability, no laxity  Low back: no swelling, redness, or deformity.  Non tender to palpation.  Full ROM. Neurologic:  - alert & oriented X3 and  cranial nerves II-XII intact.   - sensation improved in left leg - left leg: 4/5 strength on hip flexors / extensors, knee flexors and extensors.  5/5 strength in foot dorsiflexion.  5/5 strength in foot plantar flexion.   - Reflexes 3/4 bilaterally and equal.  Negative clonus, negative Babinski - Able to ambulate with slight antalgic gait - Able to squat and hold that position for at least 15 seconds. Skin:  no rashes and no suspicious lesions.   Psych:  normally interactive, not anxious appearing, and not depressed appearing.   Additional Exam:  MRI of lumbar spine / sacrum: bulging disc at L5 with foraminal encroachment but without foraminal or spinal stenosis   Impression & Recommendations:  Problem # 1:  LEG PAIN, LEFT (ICD-729.5) Assessment Improved  Leg numbness / weakness has improved.  Likely 2/2 to herniated disc that has now regressed.  Continue conservative management.  Orders: [facility]- Est  Level 4 (99214)  Problem # 2:  CHONDROMALACIA OF PATELLA (ICD-717.7) Assessment: New  Possibly due to antalgic gait from left leg weakness / numbness.  Continue NSAIDs.  Advised to ice at the end of the day.  Also advised to get a knee sleeve with petellar cut out to help keep the patella in line. Her updated medication list for this problem includes:    Meloxicam 15 Mg Tabs (Meloxicam) .Marland Kitchen... Take half to one tablet twice a day for pain    Oxycodone-acetaminophen 5-325 Mg Tabs (Oxycodone-acetaminophen) .Marland Kitchen... 1/2 tab by mouth up to 4 times per day as needed for pain must last one month  Orders: [facility]- Est  Level 4 ([address]109)  Complete Medication List: 1)  Meloxicam 15 Mg Tabs (Meloxicam) .... Take half to one tablet twice a day for pain 2)  Alprazolam 0.5 Mg Tabs (Alprazolam) .... One by mouth two times a day as needed anxiety 3)  Oxycodone-acetaminophen 5-325 Mg Tabs (Oxycodone-acetaminophen) .... 1/2 tab by mouth up to 4 times per day as needed for pain must last one month 4)  Capsaicin  0.025 % Crea (Capsaicin) .... Apply daily otc 5)  Gabapentin 100 Mg Caps (Gabapentin) .... One by mouth at bedtime x 1 week then one by mouth two times a day x 1 week then 1 by mouth three times a day thereafter 6)  Prednisone 20 Mg Tabs (Prednisone) .... 2 tabs by mouth daily for 5 days  Patient Instructions: 1)  I'm glad that the weakness / numbness has improved 2)  I think that the left knee pain is a seperate issue 3)  Continue taking the Advil. 4)  Go get a knee sleeve with a patellar cut out 5)  Ice the knee in the evening when it is swollen 6)  Follow up in 1 week

## 2010-05-20 NOTE — Progress Notes (Signed)
Summary: Rx  Phone Note Call from Patient Call back at [phone]   Reason for Call: Talk to Nurse Summary of Call: pt was given rx for pain today & directions say must last for one month, the last rx said the same so they wont fill it bc its 1 week early, pt said MD agreed to let her have it anyway but pharmacy wont fill unless md calls & says its ok,  call [facility] [facility] [address] [place]. Initial call taken by: Knox Royalty,  November 11, 2009 1:51 PM  Follow-up for Phone Call        to Dr. Lelon Perla Follow-up by: Golden Circle RN,  November 11, 2009 2:57 PM  Additional Follow-up for Phone Call Additional follow up Details #1::        Called pharmacy and approved early prescription Additional Follow-up by: Angelena Sole MD,  November 11, 2009 2:59 PM    Additional Follow-up for Phone Call Additional follow up Details #2::    told pt he has approved early fill Follow-up by: Golden Circle RN,  November 11, 2009 3:19 PM

## 2010-05-20 NOTE — Progress Notes (Signed)
Summary: triage  Phone Note Call from Patient Call back at Home Phone [phone]   Caller: Patient Summary of Call: Pt having severe ovarain pain.  Can she get something for pain? Initial call taken by: Clydell Hakim,  November 04, 2009 1:41 PM  Follow-up for Phone Call        states she is out of meds & they "don't work anyway" has been to ED for this. told her she must see an md. she will find a ride & be here at 3. she is aware she will not be seeing her pcp Follow-up by: Golden Circle RN,  November 04, 2009 1:46 PM  Additional Follow-up for Phone Call Additional follow up Details #1::        noted and agree.  Has multiple foci of pain and is already on chronic narcotic therapy at age 30.  I am reluctant to increase pain meds.  Office visit should focus on treatable/eversable cause of pain Additional Follow-up by: Doralee Albino MD,  November 04, 2009 2:53 PM

## 2010-05-20 NOTE — Progress Notes (Signed)
Summary: meds  Phone Note Call from Patient Call back at [phone]   Caller: Patient Summary of Call: pt is not taking depression meds and wants to let Dr know about wanting xanax for her ill moods [facility] [place] Initial call taken by: De Nurse,  May 03, 2009 1:54 PM  Follow-up for Phone Call        depression meds, do not want to take them, took them for 21/2 to 3 weeks and then stopped taking then, has been off for about a week now.  States she is eating better since she stopped taking them but not sleeping.  States she wanted a Rx for Xanax because she had been given some by a freind and she was able to sleep and eat while she was on them.  Encouraged to not take any medications unless they are written for her.  Heard kids in the back ground, stated she is not SI/Hi but does loss her tempemper at times, aware of this and has not hurt anyone.  States she has a support person, encouraged her to call or have that person come over when she is overwelmed and if her support person is not available to call the ED.  Apt on Wed at 130 with PCP.  Forward to PCP.Marland KitchenBria Sparr  May 03, 2009 2:16 PM  Follow-up by: Gladstone Pih,  May 03, 2009 1:57 PM  Additional Follow-up for Phone Call Additional follow up Details #1::        Called.  Will give small Rx.  Emphasized importance of keeping scheduled appointment. Additional Follow-up by: Doralee Albino MD,  May 06, 2009 10:32 AM    New/Updated Medications: ALPRAZOLAM 0.5 MG TABS (ALPRAZOLAM) one by mouth two times a day as needed anxiety Prescriptions: ALPRAZOLAM 0.5 MG TABS (ALPRAZOLAM) one by mouth two times a day as needed anxiety  #30 x 0   Entered and Authorized by:   Doralee Albino MD   Signed by:   Doralee Albino MD on 05/06/2009   Method used:   Printed then faxed to ...       [facility] [address] [id number]* (retail)       [address]       [place], [place]  [address]       Ph:  [phone]       Fax: [phone]   RxID:   [id number]

## 2010-05-20 NOTE — Miscellaneous (Signed)
Summary: changed meloxicam dose  Clinical Lists Changes spoke with pharmacist. meloxicam dose should be 15mg  daily. spoke with pcp & he asked that I call pharmacy back & change to this. she [facility] [place] [phone]. called pt home & got no answer.Golden Circle RN  November 05, 2009 11:14 AM

## 2010-05-20 NOTE — Progress Notes (Signed)
Summary: refill  Phone Note Refill Request Call back at [phone] Message from:  Patient  Refills Requested: Medication #1:  OXYCODONE-ACETAMINOPHEN 5-325 MG TABS 1/2 tab by mouth up to 4 times per day as needed for pain is going out of town tomorrow and needs the rx to fill while she is down there.  not due to 17th.  Initial call taken by: De Nurse,  September 25, 2009 10:46 AM  Follow-up for Phone Call        to Dr. Alee Gressman as pcp is out Follow-up by: Golden Circle RN,  September 25, 2009 10:51 AM  Additional Follow-up for Phone Call Additional follow up Details #1::        Dr Leveda Anna directed that patient is to have no refill until 10/04/09.  She can call back next Monday when Dr Leveda Anna is back to discuss early refill of her pain medication.  Additional Follow-up by: Blythe Veach MD,  September 25, 2009 11:03 AM     Appended Document: refill she is going to [place] & is afraid her pain will increase while she is gone. has the hard copy rx but it is written to not fill until the 17th. states she just wants a few. told her we cannot do that. she said ok

## 2010-05-20 NOTE — Progress Notes (Signed)
Summary: wi request  Phone Note Call from Patient Call back at Home Phone [phone]   Reason for Call: Talk to Nurse Summary of Call: wi request, pt went to hospital over the weekend Initial call taken by: Knox Royalty,  November 11, 2009 9:19 AM  Follow-up for Phone Call        went to ED friday night. L arm went numb as well as her L face & L leg.  hosp told her it was stress. L leg hurts when she moves it a certain way. had a cat scan of head, took blood & urine. all was normal per pt. wants leg checked. work in at 28. asked her to bring d/c papers & all med bottles with her Follow-up by: Golden Circle RN,  November 11, 2009 9:23 AM  Additional Follow-up for Phone Call Additional follow up Details #1::        noted and agree.  Please note she is followed by neurosurg for sig spondylolithisis of C spine.  See neurosug note of 03/05/09 scanned in [facility]. Additional Follow-up by: Doralee Albino MD,  November 11, 2009 9:34 AM

## 2010-05-20 NOTE — Miscellaneous (Signed)
Summary: call from Physical Therapy  Clinical Lists Changes  Received call from [facility] from [facility] requesting Dr. Cyndia Skeeters NPI #. Dr. Ok Anis has sent patient there. Although we did not send patient to [facility] we did send her to neurosurgeon in Sept 2010.  Consulted Dr. Leveda Anna and he OK's to give NPI. Prarthana Parlin RN  June 12, 2009 4:22 PM

## 2010-05-20 NOTE — Progress Notes (Signed)
Summary: refill  Phone Note Refill Request Call back at [phone] Message from:  Patient  Refills Requested: Medication #1:  OXYCODONE-ACETAMINOPHEN 5-325 MG TABS one by mouth daily.  Must last one month Please call when ready  Initial call taken by: De Nurse,  February 25, 2010 10:18 AM Caller: Patient  Follow-up for Phone Call        Done and Rx left up front Follow-up by: Doralee Albino MD,  February 26, 2010 10:07 AM    Prescriptions: OXYCODONE-ACETAMINOPHEN 5-325 MG TABS (OXYCODONE-ACETAMINOPHEN) one by mouth daily.  Must last one month  #45 x 0   Entered and Authorized by:   Doralee Albino MD   Signed by:   Doralee Albino MD on 02/26/2010   Method used:   Handwritten   RxID:   [id number]

## 2010-05-20 NOTE — Assessment & Plan Note (Signed)
Summary: pain-wants meds/[place]   Vital Signs:  Patient profile:   30 year old female Weight:      125.6 pounds Temp:     98.5 degrees F oral Pulse rate:   88 / minute Pulse rhythm:   regular BP sitting:   124 / 85  (left arm) Cuff size:   regular  Vitals Entered By: Loralee Pacas CMA (November 04, 2009 3:20 PM) CC: LLQ pain Pain Assessment Patient in pain? yes     Location: pelvis Intensity: 10 Type: burning Onset of pain  With activity Comments pt states she is having ovarian pain.   Primary Care Provider:  Doralee Albino MD  CC:  LLQ pain.  History of Present Illness: 30 yo with chronic back pain and chronic pelvic pain here for pain  LLQ pelvic pain x 3 days, worst today.  Says it feels the same as the time she went to the ER and was told she has a cyst.  Has been taking regularly prescribed ibuprofen and percocet for her chronic pain with no relief.  Pain is worse with movement.  Described as stabbing and burning.  No radiation.   No dysuria or dyspareunia.  No fever, n/v/d.  No flank pain.   Denies vaginal discharge.       Phone note mentions ER visit.  Last seen [facility] 2011 and was given percocet for lip ulcer.  Habits & Providers  Alcohol-Tobacco-Diet     Tobacco Status: current     Tobacco Counseling: to quit use of tobacco products     Cigarette Packs/Day: 1.0  Allergies: 1)  ! Metronidazole (Metronidazole) 2)  Hydrocodone PMH-FH-SH reviewed-no changes except otherwise noted  Review of Systems      See HPI  Physical Exam  General:  Well-developed,well-nourished,in no acute distress; alert,appropriate and cooperative throughout examination.  Here today with boyfriend ad two children. Abdomen:  Bowel sounds normal, abdomen soft and non-tender without masses, organomegaly or hernias noted.  No significant pain with deep auscultation.  Some pain on LLQ on palpation.  No rebound or guarding.  No flank pain.   Impression &  Recommendations:  Problem # 1:  PELVIC PAIN, CHRONIC (ICD-789.09)  patient with chronic pelvic pain.  Today with continued LLQ pain.  Reviewed Pelvic US from May 2010 which was benign showing follicles in L ovary.  history not suggestive of complicated rupture, UTI, or STD.   Changed NSAID from ibuprofen to meloxicam, advised on heat therapy.  Given red flags for return.  Advised her to make follow-up with PCP to discuss chronic pain management.  Her updated medication list for this problem includes:    Meloxicam 15 Mg Tabs (Meloxicam) .Marland Kitchen... Take half to one tablet twice a day for pain    Oxycodone-acetaminophen 5-325 Mg Tabs (Oxycodone-acetaminophen) .Marland Kitchen... 1/2 tab by mouth up to 4 times per day as needed for pain must last one month  Orders: [facility]- Est Level  3 (16109)  Complete Medication List: 1)  Meloxicam 15 Mg Tabs (Meloxicam) .... Take half to one tablet twice a day for pain 2)  Alprazolam 0.5 Mg Tabs (Alprazolam) .... One by mouth two times a day as needed anxiety 3)  Oxycodone-acetaminophen 5-325 Mg Tabs (Oxycodone-acetaminophen) .... 1/2 tab by mouth up to 4 times per day as needed for pain must last one month 4)  Capsaicin 0.025 % Crea (Capsaicin) .... Apply daily otc 5)  Gabapentin 100 Mg Caps (Gabapentin) .... One by mouth at  bedtime x 1 week then one by mouth two times a day x 1 week then 1 by mouth three times a day thereafter  Patient Instructions: 1)  Change ibuprofen to meloxicam. 2)  Try heat packs to area to help ease pain. 3)  Make follow-up appt with Dr. Leveda Anna to discuss chronic pain management Prescriptions: MELOXICAM 15 MG TABS (MELOXICAM) take half to one tablet twice a day for pain  #60 x 0   Entered and Authorized by:   Delbert Harness MD   Signed by:   Delbert Harness MD on 11/04/2009   Method used:   Electronically to        [facility] [address] [id number]* (retail)       [address]       [place], [place]  [address]       Ph:  [phone]       Fax: [phone]   RxID:   [id number]

## 2010-05-20 NOTE — Assessment & Plan Note (Signed)
Summary: F/U LEG/KH   Vital Signs:  Patient profile:   30 year old female Weight:      126.4 pounds Temp:     99 degrees F oral Pulse rate:   90 / minute Pulse rhythm:   regular BP sitting:   121 / 84  (right arm) Cuff size:   regular  Vitals Entered By: Loralee Pacas CMA (November 22, 2009 2:58 PM) CC: right leg pain Pain Assessment Patient in pain? yes     Location: leg Intensity: 6 Type: sharp,tingling,achey Onset of pain  With activity   Primary Care Provider:  Doralee Albino MD  CC:  right leg pain.  History of Present Illness: Two weeks ago went to ER for Left side face, arm and leg all went numb.  Given facial involvement, this is not related to her C-spine or her lumbar spine problems.  Resolved spontaneously.  She did have severe headache at the time.  Had nausea and photophobia with headache.  Also complains of left knee pain.  Seen already.  Wearing a brace.  Current Medications (verified): 1)  Meloxicam 15 Mg Tabs (Meloxicam) .... Take Half To One Tablet Twice A Day For Pain 2)  Alprazolam 0.5 Mg Tabs (Alprazolam) .... One By Mouth Two Times A Day As Needed Anxiety 3)  Vicodin 5-500 Mg Tabs (Hydrocodone-Acetaminophen) .... One By Mouth Three Times A Day As Needed Pain 4)  Capsaicin 0.025 % Crea (Capsaicin) .... Apply Daily Otc 5)  Gabapentin 100 Mg Caps (Gabapentin) .... One By Mouth At Bedtime X 1 Week Then One By Mouth Two Times A Day X 1 Week Then 1 By Mouth Three Times A Day Thereafter 6)  Prednisone 20 Mg Tabs (Prednisone) .... 2 Tabs By Mouth Daily For 5 Days  Allergies (verified): 1)  ! Metronidazole (Metronidazole) 2)  Hydrocodone  Past History:  Past medical, surgical, family and social histories (including risk factors) reviewed, and no changes noted (except as noted below).  Past Medical History: Reviewed history from 11/15/2009 and no changes required. recurrent bacterial vaginosis C4-C5 herniated disc being followed by Neurosurgery L5  herniated disc without foraminal / spinal stenosis  Family History: Reviewed history from 06/17/2006 and no changes required. Mother is Virgie Kunda, grandmother is Reatha Harps, Was with father of children from 83 until split in 2003.  They have two children: Revonda Standard born 2000 and lives with Adriena and [place] born 1998 who lives with ex Verdon Cummins) and Jesse`s mother.  Social History: Reviewed history from 03/28/2009 and no changes required. smokes 1/2 ppd; four children born 67 and 2000  2006;2009 has not completed high school working on [employer]; lives daughter, Revonda Standard, and a female roommate; Tiajuana Amass are a big problem Best Contact # [phone] (cell)  Physical Exam  General:  Well-developed,well-nourished,in no acute distress; alert,appropriate and cooperative throughout examination Neck:  No deformities, masses, or tenderness noted. Lungs:  Normal respiratory effort, chest expands symmetrically. Lungs are clear to auscultation, no crackles or wheezes. Heart:  Normal rate and regular rhythm. S1 and S2 normal without gallop, murmur, click, rub or other extra sounds. Abdomen:  Bowel sounds positive,abdomen soft and non-tender without masses, organomegaly or hernias noted. Extremities:  Crepitis to compression of Lt patella.  Also has abnormal Q angle of left knee   Impression & Recommendations:  Problem # 1:  MIGRAINE, HEMIPLEGIC (ICD-346.80)  She is highly unlikely to have had TIA or CVA and I plan no further work up.  Two possibilities are hemiplegic migraine or  related to her anxiety disorder.  She may fit into somatoform disorder catagory, but I will defer labeling her as such at this time Her updated medication list for this problem includes:    Meloxicam 15 Mg Tabs (Meloxicam) .Marland Kitchen... Take half to one tablet twice a day for pain    Vicodin 5-500 Mg Tabs (Hydrocodone-acetaminophen) ..... One by mouth three times a day as needed pain  Orders: [facility]- Est Level  3 (99213)  Problem # 2:   CHONDROMALACIA OF PATELLA (ICD-717.7)  Patello femoral tracking syndrome.  Given VMO strengthening exercises. Her updated medication list for this problem includes:    Meloxicam 15 Mg Tabs (Meloxicam) .Marland Kitchen... Take half to one tablet twice a day for pain    Vicodin 5-500 Mg Tabs (Hydrocodone-acetaminophen) ..... One by mouth three times a day as needed pain  Orders: [facility]- Est Level  3 (13086)  Complete Medication List: 1)  Meloxicam 15 Mg Tabs (Meloxicam) .... Take half to one tablet twice a day for pain 2)  Alprazolam 0.5 Mg Tabs (Alprazolam) .... One by mouth two times a day as needed anxiety 3)  Vicodin 5-500 Mg Tabs (Hydrocodone-acetaminophen) .... One by mouth three times a day as needed pain 4)  Capsaicin 0.025 % Crea (Capsaicin) .... Apply daily otc 5)  Gabapentin 100 Mg Caps (Gabapentin) .... One by mouth at bedtime x 1 week then one by mouth two times a day x 1 week then 1 by mouth three times a day thereafter 6)  Prednisone 20 Mg Tabs (Prednisone) .... 2 tabs by mouth daily for 5 days Prescriptions: VICODIN 5-500 MG TABS (HYDROCODONE-ACETAMINOPHEN) one by mouth three times a day as needed pain  #90 x 0   Entered and Authorized by:   Doralee Albino MD   Signed by:   Doralee Albino MD on 11/22/2009   Method used:   Handwritten   RxID:   [id number] ALPRAZOLAM 0.5 MG TABS (ALPRAZOLAM) one by mouth two times a day as needed anxiety  #64 x 3   Entered and Authorized by:   Doralee Albino MD   Signed by:   Doralee Albino MD on 11/22/2009   Method used:   Handwritten   RxID:   [id number]    Prevention & Chronic Care Immunizations   Influenza vaccine: Not documented    Tetanus booster: 07/19/2001: Done.    Pneumococcal vaccine: Not documented  Other Screening   Pap smear: NEGATIVE FOR INTRAEPITHELIAL LESIONS OR MALIGNANCY.  (09/04/2009)   Pap smear due: 09/05/2010   Smoking status: current  (11/15/2009)

## 2010-05-20 NOTE — Progress Notes (Signed)
Summary: rx req  Phone Note Call from Patient Call back at [phone]   Caller: mom-Kiara Murillo Summary of Call: pt has real bad toothache and called the dentist and they are going to see her the 12th  to pull about 8 teeth and has had hydrocone but it is not touching it can she get something stronger.  Initial call taken by: Clydell Hakim,  Aug 23, 2008 10:24 AM  Follow-up for Phone Call        spoke with mother . dentist has given her tylenol #3 and it does not help the pain. consulted Luretha Murphy and recommended she take ibuprofen  800 mg three  times a day , every 8 hours in conjunction with the Tylenol #3. . mother notified. Follow-up by: Theresia Lo RN,  Aug 23, 2008 10:39 AM

## 2010-05-20 NOTE — Progress Notes (Signed)
Summary: meds prob  Phone Note Call from Patient Call back at Home Phone [phone]   Caller: Patient Summary of Call: got the rx for Oxycodone 2.5 and pharmacy stated that he might want to reconsider dosage b/c they told her that it would take 6 days to get it. [facility]- [place]   Initial call taken by: De Nurse,  May 23, 2009 9:23 AM  Follow-up for Phone Call        to pcp Follow-up by: Golden Circle RN,  May 23, 2009 9:39 AM  Additional Follow-up for Phone Call Additional follow up Details #1::        I do not want to increase the dosage.  Patient has chronic pain - so the 6 day wait is not a deal breaker Additional Follow-up by: Doralee Albino MD,  May 23, 2009 11:51 AM    Additional Follow-up for Phone Call Additional follow up Details #2::    told her md response. she said ok Follow-up by: Golden Circle RN,  May 23, 2009 2:53 PM

## 2010-05-20 NOTE — Progress Notes (Signed)
Summary: Rx Problem  Phone Note Call from Patient Call back at Home Phone [phone]   Caller: Patient Summary of Call: Pt says that her rx was lost & wondering if something else can be perscribed for pain.   Initial call taken by: Clydell Hakim,  Aug 21, 2009 2:14 PM  Follow-up for Phone Call        No - she has ibuprofen.  We do not do early refills of narcotics regardless of the reaons.  Unfortunately, people who abuse medications have forced [place] to adopt this policy and it harms honest patients.  Please notify patient Follow-up by: Doralee Albino MD,  Aug 21, 2009 2:27 PM  Additional Follow-up for Phone Call Additional follow up Details #1::        called and left vm to call back Additional Follow-up by: Loralee Pacas CMA,  Aug 21, 2009 4:44 PM

## 2010-05-20 NOTE — Progress Notes (Signed)
Summary: refill  Phone Note Refill Request Call back at [phone] Message from:  Patient  Refills Requested: Medication #1:  ALPRAZOLAM 0.5 MG TABS one by mouth two times a day as needed anxiety  Medication #2:  OXYCODONE-ACETAMINOPHEN 5-325 MG TABS one by mouth daily.  Must last one month Initial call taken by: De Nurse,  March 20, 2010 3:39 PM  Follow-up for Phone Call        Both scripts written and placed up front.  Will not be able to fill percocet until 12/8. Follow-up by: Doralee Albino MD,  March 24, 2010 3:01 PM    Prescriptions: ALPRAZOLAM 0.5 MG TABS (ALPRAZOLAM) one by mouth two times a day as needed anxiety  #64 x 3   Entered and Authorized by:   Doralee Albino MD   Signed by:   Doralee Albino MD on 03/24/2010   Method used:   Handwritten   RxID:   [id number] OXYCODONE-ACETAMINOPHEN 5-325 MG TABS (OXYCODONE-ACETAMINOPHEN) one by mouth daily.  Must last one month  #45 x 0   Entered and Authorized by:   Doralee Albino MD   Signed by:   Doralee Albino MD on 03/24/2010   Method used:   Handwritten   RxID:   [id number]   Appended Document: refill called and informed pt

## 2010-05-20 NOTE — Progress Notes (Signed)
Summary: Rx Req  Phone Note Refill Request Call back at [phone] Message from:  Patient  Refills Requested: Medication #1:  VICODIN 5-500 MG TABS one by mouth three times a day as needed pain Initial call taken by: Clydell Hakim,  January 16, 2010 11:23 AM  Follow-up for Phone Call        done and patient notified.  Actually faxed to [facility] Follow-up by: Doralee Albino MD,  January 16, 2010 11:45 AM    New/Updated Medications: VICODIN 5-500 MG TABS (HYDROCODONE-ACETAMINOPHEN) one by mouth three times a day as needed pain.  May be refilled on monthly schedule Prescriptions: VICODIN 5-500 MG TABS (HYDROCODONE-ACETAMINOPHEN) one by mouth three times a day as needed pain.  May be refilled on monthly schedule  #90 x 3   Entered and Authorized by:   Doralee Albino MD   Signed by:   Doralee Albino MD on 01/16/2010   Method used:   Printed then faxed to ...       [facility] [address] [id number]* (retail)       [address]       [place], [place]  [address]       Ph: [phone]       Fax: [phone]   RxID:   [id number]

## 2010-05-20 NOTE — Progress Notes (Signed)
Summary: phn msg  Phone Note Call from Patient Call back at [phone]   Caller: Patient Summary of Call: Pt is taking 2 of the Vicoden vs 1 at a time for the pain for it to be of any help to her. Initial call taken by: Clydell Hakim,  November 29, 2009 4:18 PM  Follow-up for Phone Call        Tried to call cell - was her mom's so I did not leave message.  Also tried home phone and no answer.  Will send letter - please see today's letter Follow-up by: Doralee Albino MD,  December 02, 2009 4:49 PM

## 2010-05-20 NOTE — Letter (Signed)
Summary: Generic Letter  [facility]  [address]   [place], [place] [address]   Phone: [phone]  Fax: [phone]    12/02/2009  Kiara Murillo [address] [place], [place]  [address]  Dear Misty Stanley,   I got your call that you now need two vicodin to control your pain.  I tried to call, but I did not want to leave this message on the answering machine.     While it is OK to take two pills at one time, I remain concerned about the future for you.  Specifically, you are only 29 and already are taking pain pills regularly.  I think it is unhealthy for you in the long run to be on more pain medicine than you currently are taking.  So, I am going to hold the line at 90 pills per month.  You can take those 90 pills however it best suits you, but those must last a full month and I will not give any early refills.     I wish I could remove your pain - but experience has shown that I can't.  So the best we can hope for is to control your pain and not cause you long term problems.  That is the balance that I'm trying to achieve.  I hope you understand.  Sincerely,      Doralee Albino MD

## 2010-05-20 NOTE — Medication Information (Signed)
Summary: Prior Auth  Prior Auth   Imported By: Bradly Bienenstock 04/03/2008 15:28:59  _____________________________________________________________________  External Attachment:    Type:   Image     Comment:   External Document

## 2010-05-20 NOTE — Miscellaneous (Signed)
Summary: Outside LS spine films  Clinical Lists Changes  Problems: Removed problem of NECK PAIN (ICD-723.1) Added new problem of SPONDYLOSIS, LUMBAR (ICD-721.3) S spine series from [facility] No acute findings.  Bilateral pars defects at L5 -S1 with grade 1 anterolisthesis.

## 2010-05-20 NOTE — Progress Notes (Signed)
Summary: Rx Req  Phone Note Call from Patient Call back at Home Phone [phone]   Caller: Patient Summary of Call: Pt taking Vicoden but would like to switch to Percocet because the Vicoden not working.   Initial call taken by: Clydell Hakim,  May 20, 2009 1:52 PM  Follow-up for Phone Call        to pcp Follow-up by: Golden Circle RN,  May 20, 2009 2:06 PM  Additional Follow-up for Phone Call Additional follow up Details #1::        Pt calling back about rx request. Additional Follow-up by: Clydell Hakim,  May 22, 2009 11:50 AM    Additional Follow-up for Phone Call Additional follow up Details #2::    Called, will give 2.5 mg percocet, 60 per month.  To make appointment to discuss non narcotic pain meds such as gabapntin and/or lyrica. Follow-up by: Doralee Albino MD,  May 22, 2009 2:07 PM  New/Updated Medications: OXYCODONE-ACETAMINOPHEN 2.5-325 MG TABS (OXYCODONE-ACETAMINOPHEN) one by mouth q6h as needed severe pain Prescriptions: OXYCODONE-ACETAMINOPHEN 2.5-325 MG TABS (OXYCODONE-ACETAMINOPHEN) one by mouth q6h as needed severe pain  #60 x 0   Entered and Authorized by:   Doralee Albino MD   Signed by:   Doralee Albino MD on 05/22/2009   Method used:   Handwritten   RxID:   [id number]

## 2010-05-20 NOTE — Progress Notes (Signed)
Summary: refill  Phone Note Refill Request Call back at Home Phone [phone] Call back at [phone] Message from:  Patient  Refills Requested: Medication #1:  VICODIN 5-500 MG TABS one by mouth three times a day as needed pain Please call when ready  Initial call taken by: De Nurse,  December 16, 2009 10:05 AM  Follow-up for Phone Call        Placed handwritten Rx up front but on Rx wrote Do not fill until 12/22/09.  Rx must last one month. Follow-up by: Doralee Albino MD,  December 16, 2009 11:25 AM    Prescriptions: VICODIN 5-500 MG TABS (HYDROCODONE-ACETAMINOPHEN) one by mouth three times a day as needed pain  #90 x 0   Entered and Authorized by:   Doralee Albino MD   Signed by:   Doralee Albino MD on 12/16/2009   Method used:   Handwritten   RxID:   [id number]

## 2010-05-20 NOTE — Assessment & Plan Note (Signed)
Summary: f/u,df   Vital Signs:  Patient profile:   30 year old female Height:      63.25 inches Weight:      128.8 pounds BMI:     22.72 Temp:     98.3 degrees F oral Pulse rate:   85 / minute BP sitting:   136 / 80  (left arm) Cuff size:   regular  Vitals Entered By: Gladstone Pih (August 14, 2009 4:25 PM) CC: F/U depression, anxiety and PT, med refill Is Patient Diabetic? No Pain Assessment Patient in pain? no        Primary Care Provider:  Doralee Albino MD  CC:  F/U depression, anxiety and PT, and med refill.  History of Present Illness: FU neck/back problems and depression.     Depression/anxiety OK on Xanax.  We have tried mult meds in the past and she responds best to this.  Has not asked to escalate dose.  Will cont.  Generally doing better Back and neck continue to bother her.  No radicular symptoms.  No bowel or bladder incontinence.  Also followed by NS.  Some pain, controled by ibuprofen and percocet.  Concerned about the need for long term narcotics in this young individual  Habits & Providers  Alcohol-Tobacco-Diet     Tobacco Status: current     Tobacco Counseling: to quit use of tobacco products     Cigarette Packs/Day: 1.0  Current Medications (verified): 1)  Ibuprofen 800 Mg Tabs (Ibuprofen) .... One By Mouth Three Times A Day For Back Pain 2)  Alprazolam 0.5 Mg Tabs (Alprazolam) .... One By Mouth Two Times A Day As Needed Anxiety 3)  Oxycodone-Acetaminophen 5-325 Mg Tabs (Oxycodone-Acetaminophen) .... 1/2 Tab By Mouth Up To 4 Times Per Day As Needed For Pain 4)  Capsaicin 0.025 % Crea (Capsaicin) .... Apply Daily Otc 5)  Gabapentin 100 Mg Caps (Gabapentin) .... One By Mouth At Bedtime X 1 Week Then One By Mouth Two Times A Day X 1 Week Then 1 By Mouth Three Times A Day Thereafter  Allergies (verified): 1)  ! Metronidazole (Metronidazole) 2)  Hydrocodone  Past History:  Past medical, surgical, family and social histories (including risk factors)  reviewed, and no changes noted (except as noted below).  Past Medical History: Reviewed history from 06/17/2006 and no changes required. recurrent bacterial vaginosis  Family History: Reviewed history from 06/17/2006 and no changes required. Mother is Giana Castner, grandmother is Reatha Harps, Was with father of children from 63 until split in 2003.  They have two children: Revonda Standard born 2000 and lives with Nelsy and Stickney born 1998 who lives with ex Verdon Cummins) and Jesse`s mother.  Social History: Reviewed history from 03/28/2009 and no changes required. smokes 1/2 ppd; four children born 44 and 2000  2006;2009 has not completed high school working on [employer]; lives daughter, Revonda Standard, and a female roommate; Tiajuana Amass are a big problem Best Contact # [phone] (cell)  Review of Systems  The patient denies muscle weakness and difficulty walking.    Physical Exam  General:  Well-developed,well-nourished,in no acute distress; alert,appropriate and cooperative throughout examination Neck:  Good ROM Msk:  Back, mild limitation of ROM Neurologic:  Nl DTRs in upper and lower extremities   Impression & Recommendations:  Problem # 1:  SPONDYLOSIS, LUMBAR (ICD-721.3) Assessment Unchanged  For both problems we focused on adding non narcotic pain management.  Trial of neurontin and capcesium.  Orders: [facility]- Est Level  3 (45409)  Problem # 2:  CERVICAL SPINE DISORDER, NOS (ICD-723.9) Assessment: Improved  Orders: [facility]- Est Level  3 (35573)  Complete Medication List: 1)  Ibuprofen 800 Mg Tabs (Ibuprofen) .... One by mouth three times a day for back pain 2)  Alprazolam 0.5 Mg Tabs (Alprazolam) .... One by mouth two times a day as needed anxiety 3)  Oxycodone-acetaminophen 5-325 Mg Tabs (Oxycodone-acetaminophen) .... 1/2 tab by mouth up to 4 times per day as needed for pain 4)  Capsaicin 0.025 % Crea (Capsaicin) .... Apply daily otc 5)  Gabapentin 100 Mg Caps (Gabapentin) .... One by mouth at  bedtime x 1 week then one by mouth two times a day x 1 week then 1 by mouth three times a day thereafter  Patient Instructions: 1)  Try using capsecium cream daily 2)  Gabapentin is the new drug for nerve pain. 3)  Call in one month (or see me for PAP) to let me know how gabapentin is doing Prescriptions: ALPRAZOLAM 0.5 MG TABS (ALPRAZOLAM) one by mouth two times a day as needed anxiety  #64 x 3   Entered and Authorized by:   Doralee Albino MD   Signed by:   Doralee Albino MD on 08/14/2009   Method used:   Handwritten   RxID:   [id number] OXYCODONE-ACETAMINOPHEN 5-325 MG TABS (OXYCODONE-ACETAMINOPHEN) 1/2 tab by mouth up to 4 times per day as needed for pain  #45 x 0   Entered and Authorized by:   Doralee Albino MD   Signed by:   Doralee Albino MD on 08/14/2009   Method used:   Handwritten   RxID:   [id number] GABAPENTIN 100 MG CAPS (GABAPENTIN) one by mouth at bedtime x 1 week then one by mouth two times a day x 1 week then 1 by mouth three times a day thereafter  #90 x 3   Entered and Authorized by:   Doralee Albino MD   Signed by:   Doralee Albino MD on 08/14/2009   Method used:   Electronically to        [facility] [place] [address] (retail)       [address] [place], [place]  [address]       Ph: [phone]       Fax: [phone]   RxID:   [id number]

## 2010-05-20 NOTE — Assessment & Plan Note (Signed)
Summary: L leg pain/McMurray/hensel   Vital Signs:  Patient profile:   30 year old female Height:      63.25 inches Weight:      124 pounds BMI:     21.87 Temp:     97.9 degrees F oral Pulse rate:   74 / minute BP sitting:   118 / 69  (left arm) Cuff size:   regular  Vitals Entered By: Jimmy Footman, CMA (November 11, 2009 11:06 AM) CC: seen in e/r for left side numbness on 7/22 Is Patient Diabetic? No Pain Assessment Patient in pain? yes     Location: left leg Intensity: 9 Type: sharp Comments now she is having leg pain   Primary Care Provider:  Doralee Albino MD  CC:  seen in e/r for left side numbness on 7/22.  History of Present Illness: 1. Left leg pain / numbness / weakness: - Pt went to ER on 07/22 because of left sided numnbess / weakness.  She had left face, arm, and leg numbness and weakness.  They did a head CT which was negative and told her that it was stress.  The face numbness resolved in 1 hour however the leg numbness and weakness persisted.  She does have past medical hx significant for C4-C5 herniated disc that causes chronic left arm pain, numbness, and weakness but her leg feels different from that.  She presents to clinic today mainly because of her left leg.  She describes the numbness just as a "weird feeling that is hard to describe".  Said that it doesn't feel like pins & needles and doesn't have decreased sensation.  Her weakness is only in the left leg.  She is able to walk but she is afraid to walk because she thinks that her leg is going to give out on her.  She walks with a limp.  Their is also pain in her leg.  Described as a achey, deep pain.  Rated as a 9/10.  ROS: endorses chronic low back pain.  Denies saddle anesthesia, radicular type shooting pain, loss of bowel / bladder function.  Current Medications (verified): 1)  Meloxicam 15 Mg Tabs (Meloxicam) .... Take Half To One Tablet Twice A Day For Pain 2)  Alprazolam 0.5 Mg Tabs (Alprazolam) .... One By  Mouth Two Times A Day As Needed Anxiety 3)  Oxycodone-Acetaminophen 5-325 Mg Tabs (Oxycodone-Acetaminophen) .... 1/2 Tab By Mouth Up To 4 Times Per Day As Needed For Pain Must Last One Month 4)  Capsaicin 0.025 % Crea (Capsaicin) .... Apply Daily Otc 5)  Gabapentin 100 Mg Caps (Gabapentin) .... One By Mouth At Bedtime X 1 Week Then One By Mouth Two Times A Day X 1 Week Then 1 By Mouth Three Times A Day Thereafter 6)  Prednisone 20 Mg Tabs (Prednisone) .... 2 Tabs By Mouth Daily For 5 Days  Allergies: 1)  ! Metronidazole (Metronidazole) 2)  Hydrocodone  Past History:  Past Medical History: recurrent bacterial vaginosis C4-C5 herniated disc being followed by Neurosurgery  Social History: Reviewed history from 03/28/2009 and no changes required. smokes 1/2 ppd; four children born 24 and 2000  2006;2009 has not completed high school working on GED; lives daughter, Revonda Standard, and a female roommate; Tiajuana Amass are a big problem Best Contact # [phone] (cell)  Physical Exam  General:  Vitals reviewed.  Sitting comfortably on exam table.  No acute distress. Head:  normocephalic and atraumatic.   Eyes:  vision grossly intact.  PERRL.  EOMI.  Fundoscopic exam normal. Neck:  supple, full ROM, and no masses.   Lungs:  normal respiratory effort and normal breath sounds.   Heart:  normal rate, regular rhythm, and no murmur.   Msk:  Left leg:  no swelling, redness, or deformity.  Appears normal compared to the right.  Full ROM of the hip, knee and ankle.  No joint swelling or tenderness  Low back: no swelling, redness, or deformity.  Non tender to palpation.  Full ROM. Pulses:  2+ DP pulses.  Good cap refill Extremities:  no lower extremity edema Neurologic:  - alert & oriented X3 and cranial nerves II-XII intact.   - decreased sensation diffusely in left leg and left arm - left leg: 3/5 strength on hip flexors / extensors, knee flexors and extensors.  4/5 strength in foot dorsiflexion.  5/5  strength in foot plantar flexion.   - Reflexes 3/4 bilaterally and equal.  Negative clonus, negative Babinski - Able to ambulate with antalgic gait - Able to squat and hold that position for at least 15 seconds.   Impression & Recommendations:  Problem # 1:  LEG PAIN, LEFT (ICD-729.5) Assessment New Left leg weakness / numbness / and pain.  Unsure of the cause of this.  Doesn't seem to be from spinal pathology.  However, given the collection of symptoms will order MRI of lumbar / sacral spine.  Will treat pain with Percocet and short course of Prednisone.  Discussed case and had second opinions from Dr. Nedra Hai and Dr. Mauricio Po who agree with the plan. Orders: MRI (MRI) [facility]- Est  Level 4 ([address])  Complete Medication List: 1)  Meloxicam 15 Mg Tabs (Meloxicam) .... Take half to one tablet twice a day for pain 2)  Alprazolam 0.5 Mg Tabs (Alprazolam) .... One by mouth two times a day as needed anxiety 3)  Oxycodone-acetaminophen 5-325 Mg Tabs (Oxycodone-acetaminophen) .... 1/2 tab by mouth up to 4 times per day as needed for pain must last one month 4)  Capsaicin 0.025 % Crea (Capsaicin) .... Apply daily otc 5)  Gabapentin 100 Mg Caps (Gabapentin) .... One by mouth at bedtime x 1 week then one by mouth two times a day x 1 week then 1 by mouth three times a day thereafter 6)  Prednisone 20 Mg Tabs (Prednisone) .... 2 tabs by mouth daily for 5 days  Patient Instructions: 1)  I am not sure what is causing your left leg pain, numbness, and weakness. 2)  We will get an MRI of your back to make sure that it is not coming from there. 3)  I have refilled your Percocet and want you to take a short course of Prednisone to help with any inflammation 4)  Please return to clinic on Thursday or Friday to recheck your leg Prescriptions: PREDNISONE 20 MG TABS (PREDNISONE) 2 tabs by mouth daily for 5 days  #10 x 0   Entered and Authorized by:   Angelena Sole MD   Signed by:   Angelena Sole MD on 11/11/2009    Method used:   Print then Give to Patient   RxID:   [id number] OXYCODONE-ACETAMINOPHEN 5-325 MG TABS (OXYCODONE-ACETAMINOPHEN) 1/2 tab by mouth up to 4 times per day as needed for pain Must last one month  #45 x 0   Entered and Authorized by:   Angelena Sole MD   Signed by:   Angelena Sole MD on 11/11/2009   Method used:   Print then Give to Patient   RxID:  [id number]  

## 2010-05-20 NOTE — Progress Notes (Signed)
Summary: Rx Prob  Phone Note Call from Patient Call back at Home Phone [phone]   Caller: Patient Summary of Call: [facility] pharmacy was going to call and ask if Dr. Leveda Anna can up the dosage of her oxyocodone.  Wanted Dr. Leveda Anna to know. Initial call taken by: Clydell Hakim,  May 27, 2009 1:48 PM  Follow-up for Phone Call        spoke with pharmacist and he states they cannot get Oxycodone 2.5 mg at all. will send  to MD to please advise. Follow-up by: Theresia Lo RN,  May 27, 2009 1:59 PM  Additional Follow-up for Phone Call Additional follow up Details #1::        I will give new handwritten Rx to nurse.  Please tell patient she is to take 1/2 tab rather than full tab and now the quantity 30 tabs must last one month. Additional Follow-up by: Doralee Albino MD,  May 28, 2009 9:34 AM    Additional Follow-up for Phone Call Additional follow up Details #2::    patient notified. Follow-up by: Theresia Lo RN,  May 28, 2009 9:51 AM  New/Updated Medications: OXYCODONE-ACETAMINOPHEN 2.5-325 MG TABS (OXYCODONE-ACETAMINOPHEN) one/half by mouth q6h as needed severe pain Prescriptions: OXYCODONE-ACETAMINOPHEN 2.5-325 MG TABS (OXYCODONE-ACETAMINOPHEN) one/half by mouth q6h as needed severe pain  #30 x 0   Entered and Authorized by:   Doralee Albino MD   Signed by:   Doralee Albino MD on 05/28/2009   Method used:   Handwritten   RxID:   [id number]

## 2010-05-20 NOTE — Progress Notes (Signed)
Summary: triage  Phone Note Call from Patient Call back at Home Phone [phone]   Caller: Patient Summary of Call: Can she get a rx for the knee brace that the doctor told her to get so insurance will pay for it? Initial call taken by: Clydell Hakim,  November 15, 2009 3:01 PM  Follow-up for Phone Call        told her medicaid will not pay for this since she is an adult. told her where to find some at low cost Follow-up by: Golden Circle RN,  November 15, 2009 3:06 PM

## 2010-05-22 ENCOUNTER — Encounter: Payer: Self-pay | Admitting: *Deleted

## 2010-05-22 NOTE — Progress Notes (Signed)
  Phone Note Refill Request Call back at [phone]   Refills Requested: Medication #1:  OXYCODONE-ACETAMINOPHEN 5-325 MG TABS one by mouth daily.  Must last one month Pt is asking to pick up rx for Percocet today.  Initial call taken by: Abundio Miu,  April 15, 2010 1:41 PM Caller: Patient  Follow-up for Phone Call        Please notify patient that Rx is not due until 1/8.  She needs to call for refill no more than one week in advance. Follow-up by: Doralee Albino MD,  April 15, 2010 2:24 PM  Additional Follow-up for Phone Call Additional follow up Details #1::        notified pt Additional Follow-up by: Jimmy Footman, CMA,  April 15, 2010 3:19 PM

## 2010-05-22 NOTE — Progress Notes (Signed)
  Phone Note Call from Patient   Caller: Patient Call For: [phone] Summary of Call: Pt is requesting a muscle relaxer for neck pain radiating down to shoulder area on rt side.  Most days pain on both sides.  Percocet rx is not helping with the pain.   Initial call taken by: Abundio Miu,  May 14, 2010 2:54 PM  Follow-up for Phone Call        Pt will need to be seen before can give new rx thanks LC Follow-up by: Pearlean Brownie MD,  May 15, 2010 9:03 AM  Additional Follow-up for Phone Call Additional follow up Details #1::        spoke with pt and she is going to call back up front to schedule an appt. she states ahe will want to be a work in if possible Additional Follow-up by: Jimmy Footman, CMA,  May 15, 2010 9:50 AM

## 2010-05-22 NOTE — Consult Note (Signed)
Summary: [facility]  [facility]   Imported By: De Nurse 05/05/2010 13:41:31  _____________________________________________________________________  External Attachment:    Type:   Image     Comment:   External Document

## 2010-05-22 NOTE — Progress Notes (Signed)
Summary: Surgery  Phone Note Call from Patient Call back at [phone]   Reason for Call: Referral Summary of Call: pt wants to know if [facility] has contacted [place] re: her surgery, pt needs clearance to have surgery for her heart murmur.  Initial call taken by: Knox Royalty,  April 22, 2010 9:05 AM  Follow-up for Phone Call        FWD TO PCP Follow-up by: Jimmy Footman, CMA,  April 22, 2010 9:32 AM  Additional Follow-up for Phone Call Additional follow up Details #1::        Called and discussed.  Yes, I have taken care of the clearance. Additional Follow-up by: Doralee Albino MD,  April 22, 2010 11:06 AM

## 2010-05-22 NOTE — Progress Notes (Signed)
Summary: Rx  Phone Note Refill Request Call back at [phone]   Refills Requested: Medication #1:  OXYCODONE-ACETAMINOPHEN 5-325 MG TABS one by mouth daily.  Must last one month Initial call taken by: Knox Royalty,  April 22, 2010 9:04 AM  Follow-up for Phone Call        The refill is early but she has surg planned for 1/19 - so will go ahead and write Rx now.  She is aware that I do not plan long term narcotics post op even though she will continue to have some neck issues and her low back pain will be unchanged. Follow-up by: Doralee Albino MD,  April 22, 2010 11:08 AM    Prescriptions: OXYCODONE-ACETAMINOPHEN 5-325 MG TABS (OXYCODONE-ACETAMINOPHEN) one by mouth daily.  Must last one month  #45 x 0   Entered and Authorized by:   Doralee Albino MD   Signed by:   Doralee Albino MD on 04/22/2010   Method used:   Handwritten   RxID:   [id number]

## 2010-06-12 ENCOUNTER — Ambulatory Visit: Payer: Medicaid Other | Attending: Family Medicine

## 2010-06-12 DIAGNOSIS — M542 Cervicalgia: Secondary | ICD-10-CM | POA: Insufficient documentation

## 2010-06-12 DIAGNOSIS — R5381 Other malaise: Secondary | ICD-10-CM | POA: Insufficient documentation

## 2010-06-12 DIAGNOSIS — IMO0001 Reserved for inherently not codable concepts without codable children: Secondary | ICD-10-CM | POA: Insufficient documentation

## 2010-06-12 DIAGNOSIS — R293 Abnormal posture: Secondary | ICD-10-CM | POA: Insufficient documentation

## 2010-06-19 ENCOUNTER — Ambulatory Visit: Payer: Medicaid Other

## 2010-06-26 ENCOUNTER — Ambulatory Visit: Payer: Medicaid Other

## 2010-06-30 LAB — URINALYSIS, ROUTINE W REFLEX MICROSCOPIC
Bilirubin Urine: NEGATIVE
Glucose, UA: NEGATIVE mg/dL
Hgb urine dipstick: NEGATIVE
Ketones, ur: NEGATIVE mg/dL
Nitrite: NEGATIVE
Protein, ur: NEGATIVE mg/dL
Specific Gravity, Urine: 1.005 (ref 1.005–1.030)
Urobilinogen, UA: 0.2 mg/dL (ref 0.0–1.0)
pH: 7 (ref 5.0–8.0)

## 2010-06-30 LAB — WET PREP, GENITAL
Trich, Wet Prep: NONE SEEN
Yeast Wet Prep HPF POC: NONE SEEN

## 2010-06-30 LAB — GC/CHLAMYDIA PROBE AMP, GENITAL

## 2010-06-30 LAB — PREGNANCY, URINE: Preg Test, Ur: NEGATIVE

## 2010-07-02 ENCOUNTER — Telehealth: Payer: Self-pay | Admitting: Family Medicine

## 2010-07-02 NOTE — Telephone Encounter (Signed)
Needs refill for Percocet & Xanax pls call when ready

## 2010-07-03 MED ORDER — ALPRAZOLAM 0.5 MG PO TABS: 0.5000 mg | ORAL_TABLET | Freq: Two times a day (BID) | ORAL | Status: DC | PRN

## 2010-07-03 MED ORDER — OXYCODONE-ACETAMINOPHEN 5-325 MG PO TABS: 1.0000 | ORAL_TABLET | Freq: Three times a day (TID) | ORAL | Status: DC | PRN

## 2010-07-04 ENCOUNTER — Ambulatory Visit: Payer: Medicaid Other | Attending: Neurosurgery

## 2010-07-04 DIAGNOSIS — M542 Cervicalgia: Secondary | ICD-10-CM | POA: Insufficient documentation

## 2010-07-04 DIAGNOSIS — R293 Abnormal posture: Secondary | ICD-10-CM | POA: Insufficient documentation

## 2010-07-04 DIAGNOSIS — R5381 Other malaise: Secondary | ICD-10-CM | POA: Insufficient documentation

## 2010-07-04 DIAGNOSIS — IMO0001 Reserved for inherently not codable concepts without codable children: Secondary | ICD-10-CM | POA: Insufficient documentation

## 2010-07-04 LAB — URINALYSIS, ROUTINE W REFLEX MICROSCOPIC
Bilirubin Urine: NEGATIVE
Glucose, UA: NEGATIVE mg/dL
Hgb urine dipstick: NEGATIVE
Ketones, ur: NEGATIVE mg/dL
Nitrite: NEGATIVE
Protein, ur: NEGATIVE mg/dL
Specific Gravity, Urine: 1.022 (ref 1.005–1.030)
Urobilinogen, UA: 0.2 mg/dL (ref 0.0–1.0)
pH: 5.5 (ref 5.0–8.0)

## 2010-07-04 LAB — GC/CHLAMYDIA PROBE AMP, GENITAL
Chlamydia, DNA Probe: NEGATIVE
GC Probe Amp, Genital: NEGATIVE

## 2010-07-04 LAB — WET PREP, GENITAL
Trich, Wet Prep: NONE SEEN
Yeast Wet Prep HPF POC: NONE SEEN

## 2010-07-04 LAB — PREGNANCY, URINE: Preg Test, Ur: NEGATIVE

## 2010-07-05 LAB — URINALYSIS, ROUTINE W REFLEX MICROSCOPIC
Bilirubin Urine: NEGATIVE
Glucose, UA: NEGATIVE mg/dL
Hgb urine dipstick: NEGATIVE
Ketones, ur: NEGATIVE mg/dL
Nitrite: NEGATIVE
Protein, ur: NEGATIVE mg/dL
Specific Gravity, Urine: 1.009 (ref 1.005–1.030)
Urobilinogen, UA: 0.2 mg/dL (ref 0.0–1.0)
pH: 7.5 (ref 5.0–8.0)

## 2010-07-05 LAB — DIFFERENTIAL
Basophils Absolute: 0.1 10*3/uL (ref 0.0–0.1)
Basophils Relative: 1 % (ref 0–1)
Eosinophils Absolute: 0.1 10*3/uL (ref 0.0–0.7)
Eosinophils Relative: 1 % (ref 0–5)
Lymphocytes Relative: 23 % (ref 12–46)
Lymphs Abs: 2.9 10*3/uL (ref 0.7–4.0)
Monocytes Absolute: 0.7 10*3/uL (ref 0.1–1.0)
Monocytes Relative: 6 % (ref 3–12)
Neutro Abs: 8.8 10*3/uL — ABNORMAL HIGH (ref 1.7–7.7)
Neutrophils Relative %: 70 % (ref 43–77)

## 2010-07-05 LAB — PREGNANCY, URINE: Preg Test, Ur: NEGATIVE

## 2010-07-05 LAB — BASIC METABOLIC PANEL
BUN: 9 mg/dL (ref 6–23)
CO2: 25 mEq/L (ref 19–32)
Calcium: 9.8 mg/dL (ref 8.4–10.5)
Chloride: 106 mEq/L (ref 96–112)
Creatinine, Ser: 0.7 mg/dL (ref 0.4–1.2)
GFR calc Af Amer: >60 mL/min (ref >60)
GFR calc non Af Amer: >60 mL/min (ref >60)
Glucose, Bld: 97 mg/dL (ref 70–99)
Potassium: 4.1 mEq/L (ref 3.5–5.1)
Sodium: 142 mEq/L (ref 135–145)

## 2010-07-05 LAB — CBC
HCT: 40.4 % (ref 36.0–46.0)
Hemoglobin: 13.6 g/dL (ref 12.0–15.0)
MCH: 31.6 pg (ref 26.0–34.0)
MCHC: 33.5 g/dL (ref 30.0–36.0)
MCV: 94.5 fL (ref 78.0–100.0)
Platelets: 237 10*3/uL (ref 150–400)
RBC: 4.28 MIL/uL (ref 3.87–5.11)
RDW: 12.3 % (ref 11.5–15.5)
WBC: 12.6 10*3/uL — ABNORMAL HIGH (ref 4.0–10.5)

## 2010-07-09 ENCOUNTER — Encounter: Payer: Self-pay | Admitting: Family Medicine

## 2010-07-09 DIAGNOSIS — F1990 Other psychoactive substance use, unspecified, uncomplicated: Secondary | ICD-10-CM

## 2010-07-10 ENCOUNTER — Ambulatory Visit: Payer: Medicaid Other

## 2010-07-28 LAB — COMPREHENSIVE METABOLIC PANEL
ALT: <6 U/L (ref 0–35)
AST: 18 U/L (ref 0–37)
Albumin: 5.2 g/dL (ref 3.5–5.2)
Alkaline Phosphatase: 123 U/L — ABNORMAL HIGH (ref 39–117)
BUN: 8 mg/dL (ref 6–23)
CO2: 27 mEq/L (ref 19–32)
Calcium: 10 mg/dL (ref 8.4–10.5)
Chloride: 103 mEq/L (ref 96–112)
Creatinine, Ser: 0.7 mg/dL (ref 0.4–1.2)
GFR calc Af Amer: >60 mL/min (ref >60)
GFR calc non Af Amer: >60 mL/min (ref >60)
Glucose, Bld: 92 mg/dL (ref 70–99)
Potassium: 4 mEq/L (ref 3.5–5.1)
Sodium: 144 mEq/L (ref 135–145)
Total Bilirubin: 0.2 mg/dL — ABNORMAL LOW (ref 0.3–1.2)
Total Protein: 8.9 g/dL — ABNORMAL HIGH (ref 6.0–8.3)

## 2010-07-28 LAB — DIFFERENTIAL
Basophils Absolute: 0 10*3/uL (ref 0.0–0.1)
Basophils Relative: 0 % (ref 0–1)
Eosinophils Absolute: 0.1 10*3/uL (ref 0.0–0.7)
Eosinophils Relative: 1 % (ref 0–5)
Lymphocytes Relative: 30 % (ref 12–46)
Lymphs Abs: 3.7 10*3/uL (ref 0.7–4.0)
Monocytes Absolute: 0.6 10*3/uL (ref 0.1–1.0)
Monocytes Relative: 5 % (ref 3–12)
Neutro Abs: 8.2 10*3/uL — ABNORMAL HIGH (ref 1.7–7.7)
Neutrophils Relative %: 64 % (ref 43–77)

## 2010-07-28 LAB — URINALYSIS, ROUTINE W REFLEX MICROSCOPIC
Bilirubin Urine: NEGATIVE
Glucose, UA: NEGATIVE mg/dL
Hgb urine dipstick: NEGATIVE
Ketones, ur: NEGATIVE mg/dL
Leukocytes, UA: NEGATIVE
Nitrite: POSITIVE — AB
Protein, ur: NEGATIVE mg/dL
Specific Gravity, Urine: 1.012 (ref 1.005–1.030)
Urobilinogen, UA: 0.2 mg/dL (ref 0.0–1.0)
pH: 7 (ref 5.0–8.0)

## 2010-07-28 LAB — WET PREP, GENITAL
Clue Cells Wet Prep HPF POC: NONE SEEN
Trich, Wet Prep: NONE SEEN
Yeast Wet Prep HPF POC: NONE SEEN

## 2010-07-28 LAB — URINE MICROSCOPIC-ADD ON

## 2010-07-28 LAB — CBC
HCT: 42.7 % (ref 36.0–46.0)
Hemoglobin: 14.5 g/dL (ref 12.0–15.0)
MCHC: 33.9 g/dL (ref 30.0–36.0)
MCV: 93.2 fL (ref 78.0–100.0)
Platelets: 285 10*3/uL (ref 150–400)
RBC: 4.59 MIL/uL (ref 3.87–5.11)
RDW: 12.8 % (ref 11.5–15.5)
WBC: 12.6 10*3/uL — ABNORMAL HIGH (ref 4.0–10.5)

## 2010-07-28 LAB — PREGNANCY, URINE: Preg Test, Ur: NEGATIVE

## 2010-07-28 LAB — GC/CHLAMYDIA PROBE AMP, GENITAL
Chlamydia, DNA Probe: NEGATIVE
GC Probe Amp, Genital: NEGATIVE

## 2010-07-29 ENCOUNTER — Telehealth: Payer: Self-pay | Admitting: Family Medicine

## 2010-07-29 MED ORDER — OXYCODONE-ACETAMINOPHEN 5-325 MG PO TABS: 1.0000 | ORAL_TABLET | Freq: Three times a day (TID) | ORAL | Status: DC | PRN

## 2010-07-29 NOTE — Telephone Encounter (Signed)
Needs refill on percocet - also wants to know if she should still get her muscle relaxer's from her neurosurgeon.

## 2010-07-29 NOTE — Telephone Encounter (Signed)
Called and discussed.  States neurosurg has recommended PT.  Not pushing surg.  I have not seen for a while.  Make appointment to see me.  Will refill percocet (handwritten) but write do not fill before 08/03/10

## 2010-08-05 LAB — URINALYSIS, ROUTINE W REFLEX MICROSCOPIC
Bilirubin Urine: NEGATIVE
Glucose, UA: NEGATIVE mg/dL
Ketones, ur: 15 mg/dL — AB
Nitrite: POSITIVE — AB
Protein, ur: 100 mg/dL — AB
Specific Gravity, Urine: 1.025 (ref 1.005–1.030)
Urobilinogen, UA: 1 mg/dL (ref 0.0–1.0)
pH: 7 (ref 5.0–8.0)

## 2010-08-05 LAB — WET PREP, GENITAL
Trich, Wet Prep: NONE SEEN
Yeast Wet Prep HPF POC: NONE SEEN

## 2010-08-05 LAB — URINE CULTURE: Colony Count: >=100000

## 2010-08-05 LAB — GC/CHLAMYDIA PROBE AMP, GENITAL
Chlamydia, DNA Probe: NEGATIVE
GC Probe Amp, Genital: NEGATIVE

## 2010-08-05 LAB — PREGNANCY, URINE: Preg Test, Ur: NEGATIVE

## 2010-08-05 LAB — URINE MICROSCOPIC-ADD ON

## 2010-08-29 ENCOUNTER — Ambulatory Visit (INDEPENDENT_AMBULATORY_CARE_PROVIDER_SITE_OTHER): Payer: Medicaid Other | Admitting: Family Medicine

## 2010-08-29 ENCOUNTER — Encounter: Payer: Self-pay | Admitting: Family Medicine

## 2010-08-29 VITALS — BP 132/82 | HR 73 | Temp 98.2°F | Ht 64.0 in | Wt 120.5 lb

## 2010-08-29 DIAGNOSIS — M545 Low back pain, unspecified: Secondary | ICD-10-CM

## 2010-08-29 DIAGNOSIS — M2240 Chondromalacia patellae, unspecified knee: Secondary | ICD-10-CM

## 2010-08-29 DIAGNOSIS — G894 Chronic pain syndrome: Secondary | ICD-10-CM

## 2010-08-29 DIAGNOSIS — M5382 Other specified dorsopathies, cervical region: Secondary | ICD-10-CM

## 2010-08-29 DIAGNOSIS — F329 Major depressive disorder, single episode, unspecified: Secondary | ICD-10-CM

## 2010-08-29 MED ORDER — OXYCODONE-ACETAMINOPHEN 5-325 MG PO TABS: 1.0000 | ORAL_TABLET | Freq: Three times a day (TID) | ORAL | Status: DC | PRN

## 2010-08-29 MED ORDER — IBUPROFEN 800 MG PO TABS: 800.0000 mg | ORAL_TABLET | Freq: Three times a day (TID) | ORAL | Status: AC

## 2010-08-29 MED ORDER — GABAPENTIN 300 MG PO CAPS: 300.0000 mg | ORAL_CAPSULE | Freq: Three times a day (TID) | ORAL | Status: DC

## 2010-08-29 MED ORDER — CARISOPRODOL 350 MG PO TABS: 350.0000 mg | ORAL_TABLET | Freq: Three times a day (TID) | ORAL | Status: DC | PRN

## 2010-08-29 NOTE — Patient Instructions (Addendum)
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  Do the knee exercises I showed you. See me 1 month.  You need a PAP smear.

## 2010-09-01 DIAGNOSIS — G894 Chronic pain syndrome: Secondary | ICD-10-CM | POA: Insufficient documentation

## 2010-09-01 NOTE — Progress Notes (Signed)
  Subjective:    Patient ID: Kiara Murillo, female    DOB: Feb 25, 1981, 30 y.o.   MRN: [id number]  HPI Patient has multiple complaints of pain.  Back, neck, both knees Rt>Lt.  Also has had abd, pelvic and chest pain in the past.    Big stressor: extended family lives [place] same land in multiple houses.  The free[place] is coming [place] and they will all lose their homes.    Review of Systems     Objective:   Physical Exam No knee effusion.  Rt side has abnormal Q angle and pain to compression of the patella.       Assessment & Plan:

## 2010-09-01 NOTE — Assessment & Plan Note (Signed)
Main complaint today.  Recommended exercises

## 2010-09-01 NOTE — Assessment & Plan Note (Signed)
Rx with gabapentin in that most symptoms sound neuropathic pain.

## 2010-09-01 NOTE — Assessment & Plan Note (Signed)
Seems upbeat today.  I am concerned about stress of upcoming forced family relocation.

## 2010-09-02 NOTE — Op Note (Signed)
NAME:  Kiara Murillo, Kiara Murillo                  ACCOUNT NO.:  [id number]   MEDICAL RECORD NO.:  [id number]          PATIENT TYPE:  INP   LOCATION:  9123                          FACILITY:  [facility]   PHYSICIAN:  Carrington Clamp, M.D. DATE OF BIRTH:  [birth year]   DATE OF PROCEDURE:  02/29/2008  DATE OF DISCHARGE:                               OPERATIVE REPORT   PREOPERATIVE DIAGNOSIS:  Active labor in transverse presentation and  multiparous, desires permanent sterility.   POSTOPERATIVE DIAGNOSES:  Active labor in transverse presentation and  multiparous, desires permanent sterility.   PROCEDURE:  Low transverse cesarean section with bilateral tubal  ligation.   SURGEON:  Carrington Clamp, MD   ANESTHESIA:  Spinal.   FINDINGS:  Female infant in the back-down transverse presentation.  Apgars  were 5 and 9, normal tubes, ovaries and uterus seen.  Cord pH of 7.2 and  there was only 1 minute between the uterine incision and delivery of  baby.   SPECIMENS:  Right and left tubal segments to pathology.   ESTIMATED BLOOD LOSS:  800 mL.   IV FLUIDS:  1800 mL.   URINE OUTPUT:  450 mL.   COMPLICATIONS:  None.   MEDICATIONS:  Ancef and Pitocin.   Counts were correct x3.   TECHNIQUE:  After adequate spinal anesthesia was achieved, the patient  was prepped and draped in the usual sterile fashion in dorsal supine  position with leftward tilt.  A Pfannenstiel skin incision was made with  the scalpel and carried down to the fascia with Bovie cautery.  Fascia  was incised in the midline with the scalpel and then carried in  transverse curvilinear manner with a Mayo scissors.  The fascia was  reflected superior and inferior from the rectus muscles.  The rectus  muscles were split in the midline.  The bowel free portion of peritoneum  was entered into bluntly and the peritoneum stretched open in the  superior and inferior manner.  The Lexus instrument was placed inside  the abdomen and the vesicouterine  fascia tented up and incised in a  transverse curvilinear manner.   A 2-cm incision was made in the upper part of lower uterine segment  until the amnion was seen.  The uterus incision was stretched opened in  a transverse curvilinear manner.  The amnion was ruptured and the baby  was identified in the back-down transverse presentation.  An attempt was  made at first to bring the buttocks through the incision but this was  unsuccessful and so the head was then able to be easily brought down to  the incision and baby was delivered without complication.  The baby was  bulb suctioned and the cord was clamped and cut.  The baby was handed to  awaiting pediatrics and the placenta was delivered.  Cord bloods and  cord gases were obtained and the uterine incision was then closed with a  running lock stitch of 0 Monocryl.  An imbricating layer of 0 Monocryl  was performed as well.  Once hemostasis was achieved, attention was then  turned to the  tubes.  An avascular portion of mesosalpinx was entered  under each tube.  Two freehand ties were passed on each side to create  intervening segment of approximately 2-3 cm.  This segment was then  excised and this was performed bilaterally at both right and left tubes.  The right and left tubes were then sent to pathology.  Hemostasis was  achieved and the uterine incision was reinspected and found to be  hemostatic as well.   Irrigation was performed and the uterine incision reinspected and found  to be hemostatic.  The peritoneum was then closed with a running stitch  of 2-0 Vicryl.  This incorporated the rectus muscles as a separate  layer.  The fascia was closed with running stitch of 0 Vicryl.  Subcutaneous tissue was rendered hemostatic with Bovie cautery and the  skin was closed with staples.  The patient tolerated the procedure well  and was returned to the recovery room in stable condition.      Carrington Clamp, M.D.  Electronically  Signed     MH/MEDQ  D:  02/29/2008  T:  02/29/2008  Job:  [id number]

## 2010-09-03 ENCOUNTER — Ambulatory Visit: Payer: Medicaid Other | Admitting: Family Medicine

## 2010-09-05 NOTE — Discharge Summary (Signed)
NAME:  Kiara Murillo, Kiara Murillo                  ACCOUNT NO.:  [id number]   MEDICAL RECORD NO.:  [id number]          PATIENT TYPE:  INP   LOCATION:  9123                          FACILITY:  [facility]   PHYSICIAN:  Carrington Clamp, M.D. DATE OF BIRTH:  15-Jun-1980   DATE OF ADMISSION:  02/29/2008  DATE OF DISCHARGE:  03/03/2008                               DISCHARGE SUMMARY   FINAL DIAGNOSES:  Intrauterine pregnancy at [redacted] weeks gestation active  labor, transverse presentation, and the patient desires permanent  sterilization.   PROCEDURE:  Primary low transverse cesarean section with bilateral tubal  ligation.   SURGEON:  Carrington Clamp, MD   COMPLICATIONS:  None.   This 30 year old G5, P3-0-1-3 presents at 21 weeks' gestation and in  labor.  The patient's antepartum course up to this point had been  complicated by her known tobacco dependence, which she was counseled on  cessation throughout her pregnancy.  The patient was noted to be  positive group B strep on her urine culture, but her antepartum course  otherwise at this point had been uncomplicated.  The patient was found  on exam to be in transverse presentation, which was confirmed with  ultrasound.  The patient also expressed her desire to a permanent  sterilization.  A discussion was held with the patient regarding C-  section.  Decision was made to proceed at this time.  The patient was  taken to the operating room on February 29, 2008, by Dr. Carrington Clamp where a primary low transverse cesarean section was performed  with the delivery of a female infant with Apgars of 5 and 9.  A cord pH of  7.2 was collected and there was only 1 minute between the uterine  incision and delivery of the baby, she was within a back down transverse  presentation.  After delivery, a tubal ligation was performed.  The  patient tolerated the procedure well.  The patient's postoperative  course was benign without any significant fevers.  Baby was in the  NICU,  but stable.  The patient was felt ready for discharge on postoperative  day #3.  She desired her little boy to be circumcised, but had not paid  for yet, and I think it will be done as an outpatient.  The patient was  sent home on a regular diet, told to decrease activities, told to  continue her prenatal vitamins and her iron as directed, was given  Percocet 1-2 every 4-6 hours as needed for her pain.  She is to follow  up in our office in 2 weeks.  Instructions and precautions were reviewed  with the patient.   LABORATORY ON DISCHARGE:  The patient had a hemoglobin of 9.8, white  blood cell count of 21.4, and platelets of 218,000.     Leilani Able, P.A.-C.      Carrington Clamp, M.D.  Electronically Signed   MB/MEDQ  D:  04/02/2008  T:  04/03/2008  Job:  [id number]

## 2010-09-18 ENCOUNTER — Other Ambulatory Visit: Payer: Self-pay | Admitting: Family Medicine

## 2010-09-18 NOTE — Telephone Encounter (Signed)
Pt calling for refill on percocet.

## 2010-09-19 ENCOUNTER — Other Ambulatory Visit (HOSPITAL_COMMUNITY)
Admission: RE | Admit: 2010-09-19 | Discharge: 2010-09-19 | Disposition: A | Discharge status: Home / Self Care | Payer: Medicaid Other | Source: Ambulatory Visit | Attending: Family Medicine | Admitting: Family Medicine

## 2010-09-19 ENCOUNTER — Ambulatory Visit (INDEPENDENT_AMBULATORY_CARE_PROVIDER_SITE_OTHER): Payer: Medicaid Other | Admitting: Sports Medicine

## 2010-09-19 VITALS — BP 112/75 | HR 67 | Temp 97.7°F | Ht 64.0 in | Wt 122.0 lb

## 2010-09-19 DIAGNOSIS — R109 Unspecified abdominal pain: Secondary | ICD-10-CM

## 2010-09-19 DIAGNOSIS — N912 Amenorrhea, unspecified: Secondary | ICD-10-CM

## 2010-09-19 DIAGNOSIS — Z124 Encounter for screening for malignant neoplasm of cervix: Secondary | ICD-10-CM

## 2010-09-19 DIAGNOSIS — N390 Urinary tract infection, site not specified: Secondary | ICD-10-CM

## 2010-09-19 DIAGNOSIS — Z01419 Encounter for gynecological examination (general) (routine) without abnormal findings: Secondary | ICD-10-CM | POA: Insufficient documentation

## 2010-09-19 LAB — POCT UA - MICROSCOPIC ONLY

## 2010-09-19 LAB — POCT URINALYSIS DIPSTICK
Bilirubin, UA: NEGATIVE
Blood, UA: NEGATIVE
Glucose, UA: NEGATIVE
Ketones, UA: NEGATIVE
Leukocytes, UA: NEGATIVE
Nitrite, UA: POSITIVE
Protein, UA: NEGATIVE
Spec Grav, UA: 1.02
Urobilinogen, UA: 0.2
pH, UA: 6

## 2010-09-19 LAB — POCT URINE PREGNANCY: Preg Test, Ur: NEGATIVE

## 2010-09-19 MED ORDER — METRONIDAZOLE 500 MG PO TABS: 500.0000 mg | ORAL_TABLET | Freq: Two times a day (BID) | ORAL | Status: AC

## 2010-09-19 MED ORDER — MELOXICAM 15 MG PO TABS: 15.0000 mg | ORAL_TABLET | Freq: Every day | ORAL | Status: DC

## 2010-09-19 MED ORDER — CEFTRIAXONE SODIUM 250 MG IJ SOLR
250.0000 mg | Freq: Once | INTRAMUSCULAR | Status: AC
  Administered 2010-09-19: 250 mg via INTRAMUSCULAR

## 2010-09-19 MED ORDER — DOXYCYCLINE HYCLATE 100 MG PO TABS: 100.0000 mg | ORAL_TABLET | Freq: Two times a day (BID) | ORAL | Status: AC

## 2010-09-19 NOTE — Progress Notes (Signed)
  Subjective:    Patient ID: Kiara Murillo, female    DOB: 01/27/1981, 30 y.o.   MRN: [id number]  HPI Pelvic pain:  This patient has a long history of lumbago.  She has also had chronic pelvic pain that occurs almost monthly (may be 8x a year).  The pain starts approx 1 wk after 1st day LMP, Present L adnexal region, no radiation, lasts 1 hour at a time but can cont for 3 days.  She has tried ibu 800 which provides some benefit.  She has had many unremarkable US examinations in the past.  Intercourse is often painful.  She has had a BTL after her last cesarian.   She has no dysuria, no vag D/C,N/V/D/C/fevers/chills..   Review of Systems    See HPI Objective:   Physical Exam  Constitutional: She appears well-developed and well-nourished. No distress.  Cardiovascular: Normal rate, regular rhythm and normal heart sounds.  Exam reveals no gallop and no friction rub.   No murmur heard. Pulmonary/Chest: Effort normal. No respiratory distress. She has no wheezes. She has no rales. She exhibits no tenderness.  Abdominal: Soft. Bowel sounds are normal. She exhibits mass. She exhibits no distension. There is tenderness. There is no rebound and no guarding.  Genitourinary: Vagina normal. There is no rash, tenderness, lesion or injury on the right labia. There is no rash, tenderness, lesion or injury on the left labia. Uterus is tender. Uterus is not deviated, not enlarged and not fixed. Cervix exhibits motion tenderness and discharge. Cervix exhibits no friability. Right adnexum displays no mass, no tenderness and no fullness. Left adnexum displays mass, tenderness and fullness.            Assessment & Plan:

## 2010-09-19 NOTE — Patient Instructions (Addendum)
Great to meet you, I would like to treat you for PID. I would also like to obtain an MRI of your pelvis. Meloxicam for pain. If no better then I will refer to gynecology for laparoscopy.  Ihor Austin. Benjamin Stain, M.D.   Endometriosis Cramps, Pain and Infertility Up to ten per cent of women in child bearing years have endometriosis. Endometriosis is a disease that occurs when the endometrium (lining of the uterus) is misplaced outside of its normal location. It may occur in many locations close to the uterus (womb), but commonly on the ovaries, fallopian tubes, vagina (birth canal) and bowel located close to the uterus. Because the uterus sloughs (expels) its lining every month (menses), there is bleeding where ever the endometrial tissue is located. SYMPTOMS Often there are no symptoms (problems); however because blood is irritating to tissues not normally exposed to it, when symptoms occur they vary with the location of the misplaced endometrium. Symptoms often include back and abdominal (belly) pain. Periods may be heavier and intercourse may be painful. Infertility may be present. You may have all of these symptoms at one time or another or you may have months with no symptoms at all. Although the symptoms occur mainly during menses, they can occur mid-cycle as well, and usually terminate with menopause. DIAGNOSIS You will have to be seen by your caregiver for a diagnosis (learning what is wrong). Your caregiver may recommend a blood test and urine test (urinalysis) to help rule out other conditions. Another common test is ultrasound, a painless procedure that uses sound waves to make a sonogram "picture" of abnormal tissue that could be endometriosis. If your bowel movements are painful around your periods, your caregiver may advise a barium enema (an x-ray of the lower bowel), to try to find the source of your pain. This is sometimes confirmed by laparoscopy. Laparoscopy is a procedure where your  caregiver looks into your abdomen with a laparoscope (a small pencil sized telescope). Your caregiver may take a tiny piece of tissue (biopsy) from any abnormal tissue to confirm or document your problem. These tissues are sent to the lab and a pathologist looks at them under the microscope to give a microscopic diagnoses. TREATMENT Once the diagnosis is made, it can be treated by destruction of the misplaced endometrial tissue using heat (diathermy), laser, cutting (excision), or chemical means. It may also be treated with hormonal therapy. When using hormonal therapy menses are eliminated, therefore eliminating the monthly exposure to blood by the misplaced endometrial tissue. Only in severe cases is it necessary to perform a hysterectomy with removal of the tubes, uterus and ovaries. HOME CARE INSTRUCTIONS  Only take over-the-counter or prescription medicines for pain, discomfort, or fever as directed by your caregiver.   Avoid activities that produce pain, including a physical sexual relationship.   Do not take aspirin as this may increase bleeding when not on hormonal therapy.   See your caregiver for pain or problems not controlled with treatment.  SEEK IMMEDIATE MEDICAL CARE IF:  Your pain is severe and is not responding to pain medication.   You develop severe nausea and vomiting or can't keep foods down.   Your pain localizes to the right lower part of your abdomen (possible appendicitis).   There is swelling or increasing pain in the abdomen.   An unexplained oral temperature above 102 F (38.9 C) develops, or as your caregiver suggests.   You see blood in your stool.  MAKE SURE YOU:  Understand these instructions.   Will watch your condition.   Will get help right away if you are not doing well or get worse.  Document Released: 04/03/2000 Document Re-Released: 03/19/2008 [facility] Patient Information 2011 [place], [place].

## 2010-09-19 NOTE — Assessment & Plan Note (Addendum)
Ultrasounds have been negative. Unclear etiology however with CMT I think it would be reasonable to attempt a tx course for PID (rocephin, doxy, flagyl). GC/Chlam swabbed, anaerobes may also be etiologic. Timing of pain is not classic however endometriosis would also be a possibility. Will go ahead and check CA-125 as this can be elevated in endometriosis (as well as ovarian CA) and do MRI pelvis. If no improvement s/p PID tx and unclear dx from MRI, would recommend referral to GYN for laparoscopy. Mobic in the meantime.  Currently with UTI with enterobacter and e-coli, as sensitivities not known for the abx used and only 250mg  rocephin give, will have pt do 7d course cipro if symptoms not better.

## 2010-09-20 LAB — GC/CHLAMYDIA PROBE AMP, GENITAL
Chlamydia, DNA Probe: NEGATIVE
GC Probe Amp, Genital: NEGATIVE

## 2010-09-23 ENCOUNTER — Telehealth: Payer: Self-pay | Admitting: Family Medicine

## 2010-09-23 ENCOUNTER — Telehealth (INDEPENDENT_AMBULATORY_CARE_PROVIDER_SITE_OTHER): Payer: Medicaid Other | Admitting: Sports Medicine

## 2010-09-23 DIAGNOSIS — M545 Low back pain, unspecified: Secondary | ICD-10-CM

## 2010-09-23 LAB — URINE CULTURE: Colony Count: >=100000

## 2010-09-23 MED ORDER — OXYCODONE-ACETAMINOPHEN 5-325 MG PO TABS: 1.0000 | ORAL_TABLET | Freq: Three times a day (TID) | ORAL | Status: DC | PRN

## 2010-09-23 MED ORDER — CIPROFLOXACIN HCL 500 MG PO TABS: 500.0000 mg | ORAL_TABLET | Freq: Two times a day (BID) | ORAL | Status: AC

## 2010-09-23 NOTE — Telephone Encounter (Signed)
LVm for patient to call back.

## 2010-09-23 NOTE — Telephone Encounter (Signed)
Spoke with patient and informed her of below. Patient states that the mobic is not working for her and she was told that she could get something stronger if needed

## 2010-09-23 NOTE — Telephone Encounter (Signed)
Spoke with patient and informed her of below 

## 2010-09-23 NOTE — Telephone Encounter (Signed)
Needs precert for tomorrow

## 2010-09-23 NOTE — Progress Notes (Signed)
Addended by: Monica Becton on: 09/23/2010 08:50 AM   Modules accepted: Orders

## 2010-09-23 NOTE — Telephone Encounter (Signed)
20 extra percocet waiting at front in envelope.

## 2010-09-23 NOTE — Telephone Encounter (Signed)
Pls call Kiara Murillo and let her know she had a UTI that grew out 2 bacteria, enterobacter and ecoli.  If she is still having pain after the treatment regimen rx'ed, then have her pick up cipro (already called in) and take BID for 7 days as this is the antibiotic both bacteria were sensitive to.  Let me know how she is doing.

## 2010-09-23 NOTE — Telephone Encounter (Signed)
[id number]

## 2010-09-24 ENCOUNTER — Ambulatory Visit
Admission: RE | Admit: 2010-09-24 | Discharge: 2010-09-24 | Disposition: A | Discharge status: Home / Self Care | Payer: Medicaid Other | Source: Ambulatory Visit | Attending: Family Medicine | Admitting: Family Medicine

## 2010-09-24 DIAGNOSIS — R109 Unspecified abdominal pain: Secondary | ICD-10-CM

## 2010-09-24 MED ORDER — OXYCODONE-ACETAMINOPHEN 5-325 MG PO TABS: 1.0000 | ORAL_TABLET | Freq: Four times a day (QID) | ORAL | Status: DC | PRN

## 2010-09-24 MED ORDER — GADOBENATE DIMEGLUMINE 529 MG/ML IV SOLN
12.0000 mL | Freq: Once | INTRAVENOUS | Status: AC | PRN
  Administered 2010-09-24: 12 mL via INTRAVENOUS

## 2010-09-24 NOTE — Telephone Encounter (Signed)
I had been dragging my feet on refilling this med since she was not due until 6/11.  I see that Dr. Karie Schwalbe has given small supplemental Rx.  I told her to make an appointment with me in early July before next Rx due.  We need to discuss management and new clinic policy.

## 2010-09-25 ENCOUNTER — Encounter: Payer: Self-pay | Admitting: Sports Medicine

## 2010-09-26 ENCOUNTER — Ambulatory Visit (INDEPENDENT_AMBULATORY_CARE_PROVIDER_SITE_OTHER): Payer: Medicaid Other | Admitting: Sports Medicine

## 2010-09-26 ENCOUNTER — Encounter: Payer: Self-pay | Admitting: Sports Medicine

## 2010-09-26 VITALS — BP 112/75 | HR 87 | Wt 120.8 lb

## 2010-09-26 DIAGNOSIS — R109 Unspecified abdominal pain: Secondary | ICD-10-CM

## 2010-09-26 LAB — CA 125: CA 125: 8.2 U/mL (ref 0.0–30.2)

## 2010-09-26 NOTE — Assessment & Plan Note (Signed)
She will finish the cipro and let me know how symptoms are doing. If still present/unbearable, will send to GYN for possible laparoscopy as endometriosis is a possible dx.  Can also consider OCPs for hormone manipulation is pain still present after tx UTI. RTC prn.

## 2010-09-26 NOTE — Progress Notes (Signed)
  Subjective:    Patient ID: Kiara Murillo, female    DOB: 1980-07-17, 30 y.o.   MRN: [id number]  HPI Chronic pelvic pain:  Here to discuss results thus far.  Suspected endometriosis (see prior history).  Also with UTI (ecoli and enterobacter).  Has finished PID tx, hasn't gotten cipro for UTI yet.  MRI pelvis unremarkable.  Symptoms better but still present.  No fevers/chills/vag dc.   Review of Systems See HPI    Objective:   Physical Exam  Constitutional: She appears well-developed and well-nourished. No distress.  Skin: Skin is warm and dry.        Assessment & Plan:

## 2010-09-27 ENCOUNTER — Encounter: Payer: Self-pay | Admitting: Sports Medicine

## 2010-10-19 ENCOUNTER — Emergency Department (HOSPITAL_BASED_OUTPATIENT_CLINIC_OR_DEPARTMENT_OTHER)
Admission: EM | Admit: 2010-10-19 | Discharge: 2010-10-19 | Disposition: A | Discharge status: Home / Self Care | Payer: Medicaid Other | Attending: Emergency Medicine | Admitting: Emergency Medicine

## 2010-10-19 DIAGNOSIS — R21 Rash and other nonspecific skin eruption: Secondary | ICD-10-CM | POA: Insufficient documentation

## 2010-10-19 DIAGNOSIS — M79609 Pain in unspecified limb: Secondary | ICD-10-CM | POA: Insufficient documentation

## 2010-10-19 DIAGNOSIS — Z79899 Other long term (current) drug therapy: Secondary | ICD-10-CM | POA: Insufficient documentation

## 2010-10-23 ENCOUNTER — Other Ambulatory Visit: Payer: Self-pay | Admitting: Family Medicine

## 2010-10-23 MED ORDER — OXYCODONE-ACETAMINOPHEN 5-325 MG PO TABS: 1.0000 | ORAL_TABLET | Freq: Four times a day (QID) | ORAL | Status: DC | PRN

## 2010-10-23 NOTE — Telephone Encounter (Signed)
Refill request

## 2010-10-23 NOTE — Telephone Encounter (Signed)
Needs a refill for Percocet.  Will be going out of town and would like to be able to get it a little early because otherwise she will have to pay full price.  Will be going out of town on Sunday.  Medicaid won't cover it if she fill it anyplace else.

## 2010-10-29 ENCOUNTER — Ambulatory Visit: Payer: Medicaid Other | Admitting: Family Medicine

## 2010-11-05 ENCOUNTER — Encounter: Payer: Self-pay | Admitting: Family Medicine

## 2010-11-05 ENCOUNTER — Ambulatory Visit (INDEPENDENT_AMBULATORY_CARE_PROVIDER_SITE_OTHER): Payer: Medicaid Other | Admitting: Family Medicine

## 2010-11-05 VITALS — BP 126/79 | HR 91 | Temp 99.1°F | Wt 116.9 lb

## 2010-11-05 DIAGNOSIS — M47817 Spondylosis without myelopathy or radiculopathy, lumbosacral region: Secondary | ICD-10-CM

## 2010-11-05 DIAGNOSIS — F411 Generalized anxiety disorder: Secondary | ICD-10-CM

## 2010-11-05 DIAGNOSIS — M545 Low back pain, unspecified: Secondary | ICD-10-CM

## 2010-11-05 MED ORDER — OXYCODONE-ACETAMINOPHEN 5-325 MG PO TABS: 1.0000 | ORAL_TABLET | ORAL | Status: DC | PRN

## 2010-11-05 MED ORDER — OXYCODONE-ACETAMINOPHEN 5-325 MG PO TABS: 1.0000 | ORAL_TABLET | Freq: Four times a day (QID) | ORAL | Status: DC | PRN

## 2010-11-05 MED ORDER — ALPRAZOLAM 0.5 MG PO TABS: 0.5000 mg | ORAL_TABLET | Freq: Two times a day (BID) | ORAL | Status: DC | PRN

## 2010-11-05 NOTE — Assessment & Plan Note (Signed)
Worse due to many life stressors

## 2010-11-07 NOTE — Assessment & Plan Note (Signed)
Chronic stable.  Continue activity.

## 2010-11-07 NOTE — Progress Notes (Signed)
  Subjective:    Patient ID: Kiara Murillo, female    DOB: 10-20-1980, 30 y.o.   MRN: [id number]  HPI Patient with chronic pain in low back, abd and neck.  No further diagnostic tests planned.  She is not planning on neck surgery.  Pain meds help keep her functional.  Life stresses high for her depression and anxiety.  Has forced move planned due to [facility] and property being taken by eminent domain.  Also, cares for grandmother with dementia who is deteriorating.      Review of Systems     Objective:   Physical Exam Mild spasm and tenderness of neck extensors and or lumbar paraspinous muscles.  Flat affect.       Assessment & Plan:

## 2010-11-15 ENCOUNTER — Emergency Department (HOSPITAL_BASED_OUTPATIENT_CLINIC_OR_DEPARTMENT_OTHER)
Admission: EM | Admit: 2010-11-15 | Discharge: 2010-11-16 | Disposition: A | Discharge status: Home / Self Care | Payer: Medicaid Other | Attending: Emergency Medicine | Admitting: Emergency Medicine

## 2010-11-15 ENCOUNTER — Encounter: Payer: Self-pay | Admitting: Family Medicine

## 2010-11-15 ENCOUNTER — Encounter (HOSPITAL_BASED_OUTPATIENT_CLINIC_OR_DEPARTMENT_OTHER): Payer: Self-pay | Admitting: *Deleted

## 2010-11-15 DIAGNOSIS — S0990XA Unspecified injury of head, initial encounter: Secondary | ICD-10-CM | POA: Insufficient documentation

## 2010-11-15 DIAGNOSIS — Y9239 Other specified sports and athletic area as the place of occurrence of the external cause: Secondary | ICD-10-CM | POA: Insufficient documentation

## 2010-11-15 DIAGNOSIS — W19XXXA Unspecified fall, initial encounter: Secondary | ICD-10-CM | POA: Insufficient documentation

## 2010-11-15 DIAGNOSIS — Y9316 Activity, rowing, canoeing, kayaking, rafting and tubing: Secondary | ICD-10-CM | POA: Insufficient documentation

## 2010-11-15 DIAGNOSIS — F17200 Nicotine dependence, unspecified, uncomplicated: Secondary | ICD-10-CM | POA: Insufficient documentation

## 2010-11-15 DIAGNOSIS — S161XXA Strain of muscle, fascia and tendon at neck level, initial encounter: Secondary | ICD-10-CM

## 2010-11-15 DIAGNOSIS — S39012A Strain of muscle, fascia and tendon of lower back, initial encounter: Secondary | ICD-10-CM

## 2010-11-15 DIAGNOSIS — S139XXA Sprain of joints and ligaments of unspecified parts of neck, initial encounter: Secondary | ICD-10-CM | POA: Insufficient documentation

## 2010-11-15 DIAGNOSIS — S335XXA Sprain of ligaments of lumbar spine, initial encounter: Secondary | ICD-10-CM | POA: Insufficient documentation

## 2010-11-15 NOTE — ED Notes (Signed)
Pt states she was tubing earlier this p.m. And now is having pain in her lower spine and tailbone radiating up her back. Fingers of right hand are numb as well as her lips on the right side.

## 2010-11-16 ENCOUNTER — Emergency Department (INDEPENDENT_AMBULATORY_CARE_PROVIDER_SITE_OTHER): Payer: Medicaid Other

## 2010-11-16 DIAGNOSIS — Q762 Congenital spondylolisthesis: Secondary | ICD-10-CM

## 2010-11-16 DIAGNOSIS — M545 Low back pain, unspecified: Secondary | ICD-10-CM

## 2010-11-16 DIAGNOSIS — X58XXXA Exposure to other specified factors, initial encounter: Secondary | ICD-10-CM

## 2010-11-16 DIAGNOSIS — M549 Dorsalgia, unspecified: Secondary | ICD-10-CM

## 2010-11-16 DIAGNOSIS — S0990XA Unspecified injury of head, initial encounter: Secondary | ICD-10-CM

## 2010-11-16 DIAGNOSIS — M542 Cervicalgia: Secondary | ICD-10-CM

## 2010-11-16 DIAGNOSIS — M5030 Other cervical disc degeneration, unspecified cervical region: Secondary | ICD-10-CM

## 2010-11-16 MED ORDER — IBUPROFEN 800 MG PO TABS
800.0000 mg | ORAL_TABLET | Freq: Once | ORAL | Status: AC
  Administered 2010-11-16: 800 mg via ORAL
  Filled 2010-11-16: qty 1

## 2010-11-16 MED ORDER — OXYCODONE-ACETAMINOPHEN 5-325 MG PO TABS: 1.0000 | ORAL_TABLET | ORAL | Status: AC | PRN

## 2010-11-16 MED ORDER — OXYCODONE-ACETAMINOPHEN 5-325 MG PO TABS
1.0000 | ORAL_TABLET | Freq: Once | ORAL | Status: AC
  Administered 2010-11-16: 1 via ORAL

## 2010-11-16 MED ORDER — OXYCODONE-ACETAMINOPHEN 5-325 MG PO TABS
ORAL_TABLET | ORAL | Status: AC
  Filled 2010-11-16: qty 1

## 2010-11-16 MED ORDER — OXYCODONE-ACETAMINOPHEN 5-325 MG PO TABS
1.0000 | ORAL_TABLET | Freq: Once | ORAL | Status: AC
  Administered 2010-11-16: 1 via ORAL
  Filled 2010-11-16: qty 1

## 2010-11-16 NOTE — ED Provider Notes (Signed)
History     Chief Complaint  Patient presents with  . Back Pain   Patient is a 30 y.o. female presenting with back pain. The history is provided by the patient.  Back Pain  This is a new problem. The current episode started 6 to 12 hours ago. The problem occurs constantly. The problem has not changed since onset.The pain is associated with a recent injury (She was tubing and fell. She says she saw stars. There was initially numbness on the right side of her lip and right hand, but that has resolved. No nausea, no incoordination.). The pain is present in the lumbar spine. The quality of the pain is described as stabbing. The pain does not radiate. The pain is at a severity of 8/10. The pain is severe. The symptoms are aggravated by bending and twisting.    History reviewed. No pertinent past medical history.  Past Surgical History  Procedure Date  . Cesarean section   . Tonsillectomy   . Tubal ligation   . Mouth surgery     History reviewed. No pertinent family history.  History  Substance Use Topics  . Smoking status: Current Everyday Smoker -- 1.0 packs/day    Types: Cigarettes  . Smokeless tobacco: Never Used  . Alcohol Use: No    OB History    Grav Para Term Preterm Abortions TAB SAB Ect Mult Living                  Review of Systems  Musculoskeletal: Positive for back pain.  All other systems reviewed and are negative.    Physical Exam  BP 128/88  Pulse 82  Temp(Src) 98.7 F (37.1 C) (Oral)  Resp 18  Ht 5\' 4"  (1.626 m)  Wt 120 lb (54.432 kg)  BMI 20.60 kg/m2  SpO2 100%  LMP 11/15/2010  Physical Exam  Nursing note and vitals reviewed. Constitutional: She is oriented to person, place, and time. She appears well-developed and well-nourished. No distress.  HENT:  Head: Normocephalic and atraumatic.  Right Ear: External ear normal.  Left Ear: External ear normal.  Nose: Nose normal.  Mouth/Throat: Oropharynx is clear and moist.  Eyes: Conjunctivae and  EOM are normal. Pupils are equal, round, and reactive to light. No scleral icterus.  Neck:       She is in a cervical collar. Moderate cervical spine tenderness is present.  Cardiovascular: Normal rate, regular rhythm and normal heart sounds.   No murmur heard. Pulmonary/Chest: Effort normal and breath sounds normal. She has no wheezes. She has no rales.  Abdominal: Soft. Bowel sounds are normal. She exhibits no mass. There is no tenderness.  Musculoskeletal: Normal range of motion. She exhibits no edema.       There is moderate tenderness of the lumbar spine. No tenderness of the thoracic spine.  Neurological: She is alert and oriented to person, place, and time. No cranial nerve deficit. Coordination normal.  Skin: Skin is warm and dry. No rash noted.  Psychiatric: She has a normal mood and affect.    ED Course  Procedures  MDM No serious injury. She is given Percocet and Ibuprofen in the ED for pain. X-rays were reviewed, images viewed by me.  Results for orders placed in visit on 09/26/10  CA 125      Component Value Range   CA 125 8.2  0.0 - 30.2 (U/mL)   Dg Cervical Spine Complete  11/16/2010  *RADIOLOGY REPORT*  Clinical Data: Neck pain.  CERVICAL  SPINE - COMPLETE 4+ VIEW  Comparison: 01/04/2009  Findings: No evidence for acute fracture.  There is loss of disc height at C5-6, as before.  No subluxation.  The facets are well- aligned bilaterally.  There is straightening of the normal cervical lordosis.  No prevertebral soft tissue swelling.  Stable exam.  IMPRESSION: Stable exam.  Loss of disc height at C5-6 with mild endplate degeneration.  No acute bony findings.  Original Report Authenticated By: ERIC A. MANSELL, M.D.   Dg Lumbar Spine Complete  11/16/2010  *RADIOLOGY REPORT*  Clinical Data: Low back pain.  LUMBAR SPINE - COMPLETE 4+ VIEW  Comparison: Lumbar spine MRI from 11/14/2009  Findings: No evidence for fracture.  Trace anterolisthesis of L5 on S1 is noted.  Bilateral pars  interarticularis defects are seen at the L5 level.  SI joints are normal.  IMPRESSION: No acute bony abnormality.  Bilateral L5 pars defects with stable grade 1 spondylolisthesis.  Original Report Authenticated By: ERIC A. MANSELL, M.D.   Ct Head Wo Contrast  11/16/2010  *RADIOLOGY REPORT*  Clinical Data: Head trauma  CT HEAD WITHOUT CONTRAST  Technique:  Contiguous axial images were obtained from the base of the skull through the vertex without contrast.  Comparison: 11/08/2009  Findings: No acute hemorrhage, acute infarction, or mass lesion is identified.  No midline shift.  No ventriculomegaly.  No skull fracture.  Orbits and paranasal sinuses are intact.  IMPRESSION: Normal exam.  Original Report Authenticated By: Harrel Lemon, M.D.          Dione Booze, MD 11/16/10 [phone]

## 2010-11-27 IMAGING — US US TRANSVAGINAL NON-OB
1 series · 14 of 25 positions shown · non-contrast
Comparison: None available.

CLINICAL DATA: Left side abdominal pain, vaginal bleeding.  History
of bilateral tubal ligation.

TRANSABDOMINAL ULTRASOUND OF PELVIS
TECHNIQUE: Transabdominal ultrasound examination of the pelvis was
performed including evaluation of the uterus, ovaries, adnexal
regions, and pelvic cul-de-sac.

[Series 1: us transvaginal non-ob · 0.20mm/px · 14 of 69 slices shown]
[im 1/69]
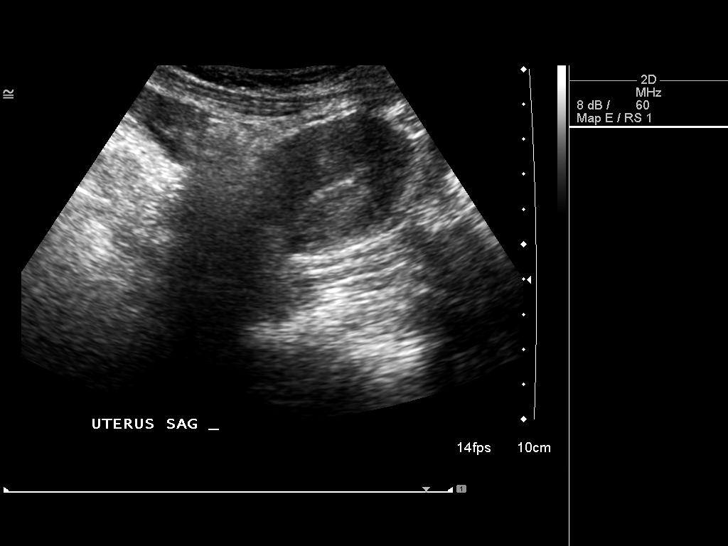
[im 6/69]
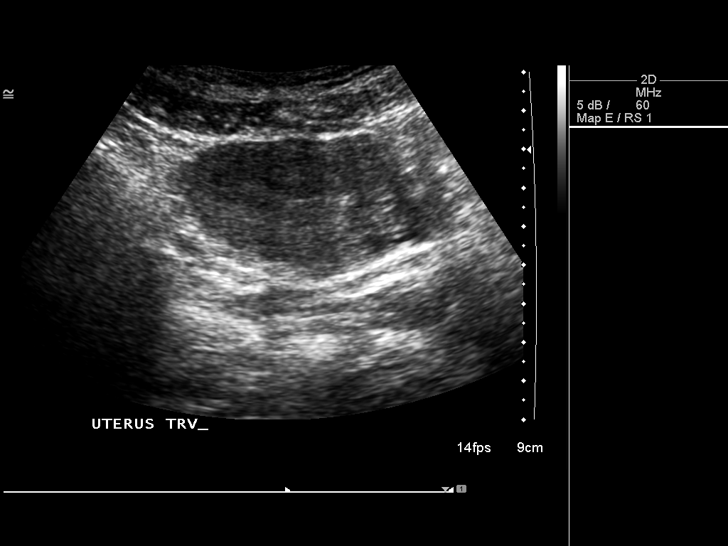
[im 12/69]
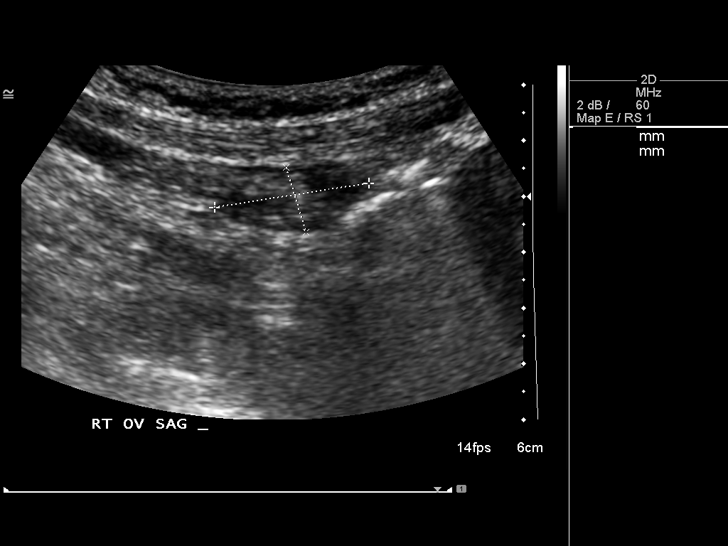
[im 18/69]
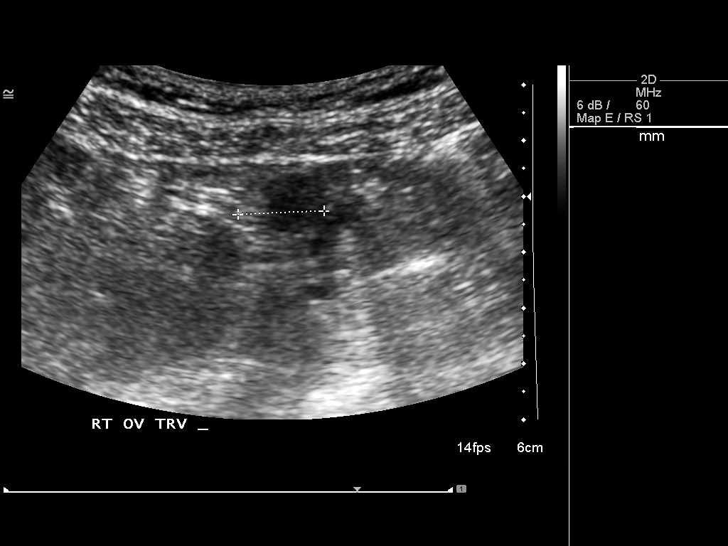
[im 23/69]
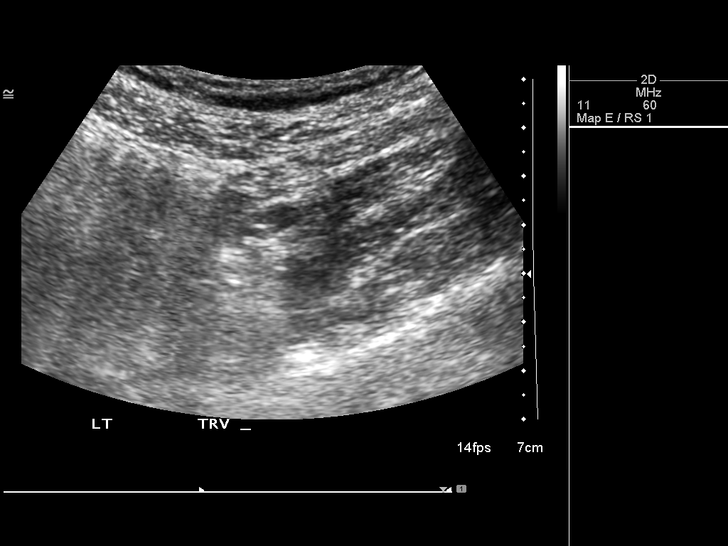
[im 26/69]
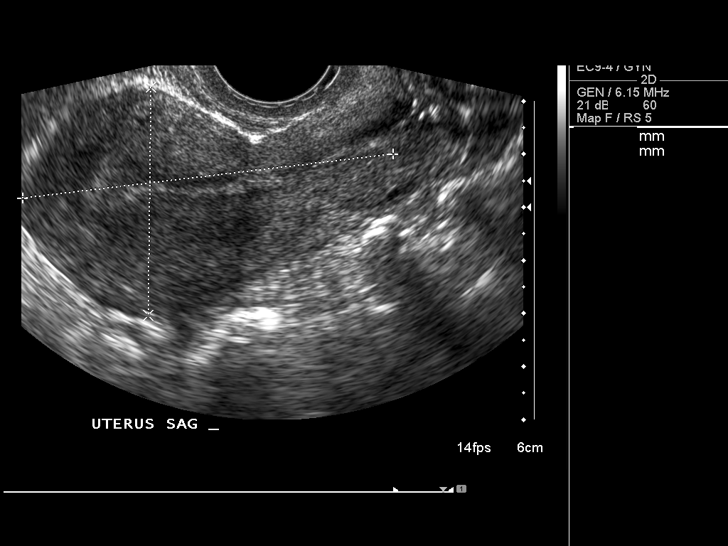
[im 32/69]
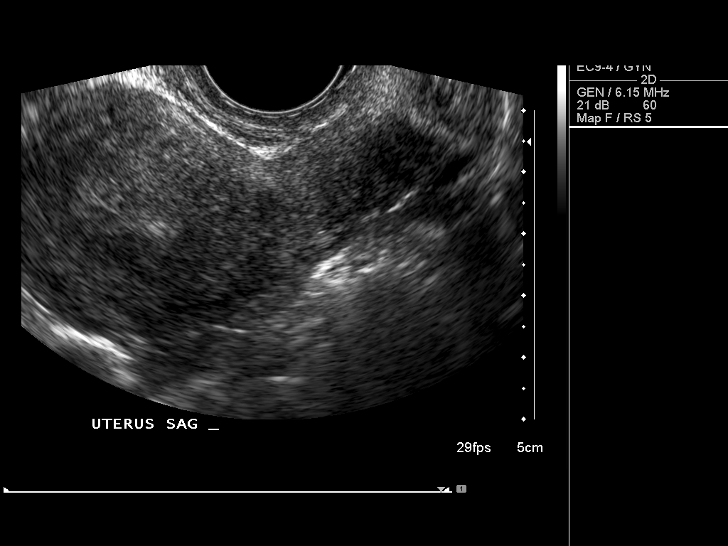
[im 37/69]
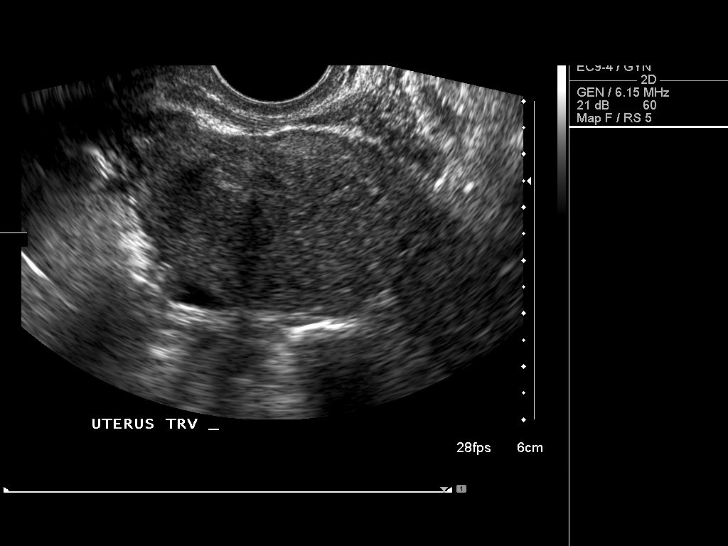
[im 43/69]
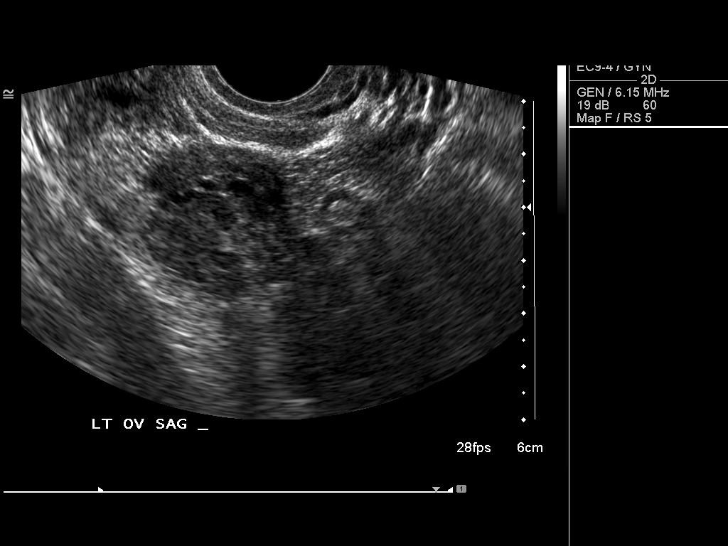
[im 46/69]
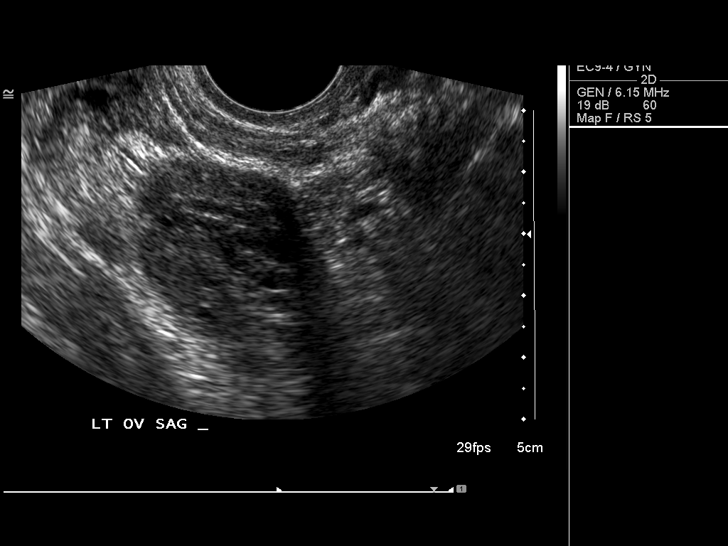
[im 52/69]
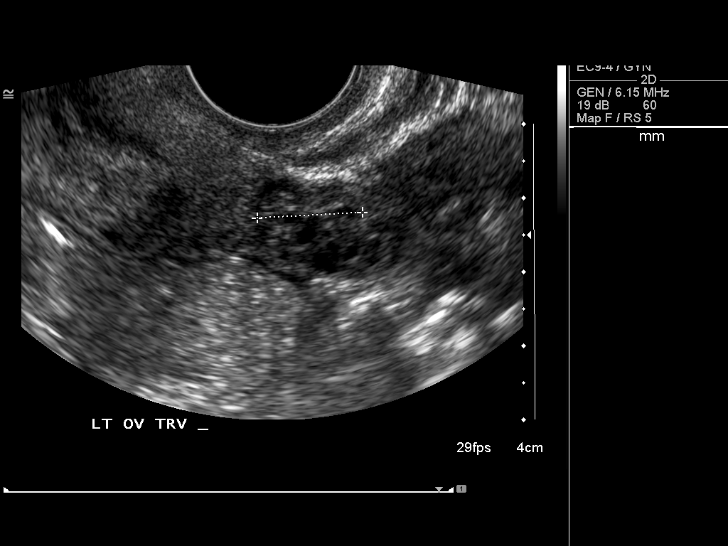
[im 57/69]
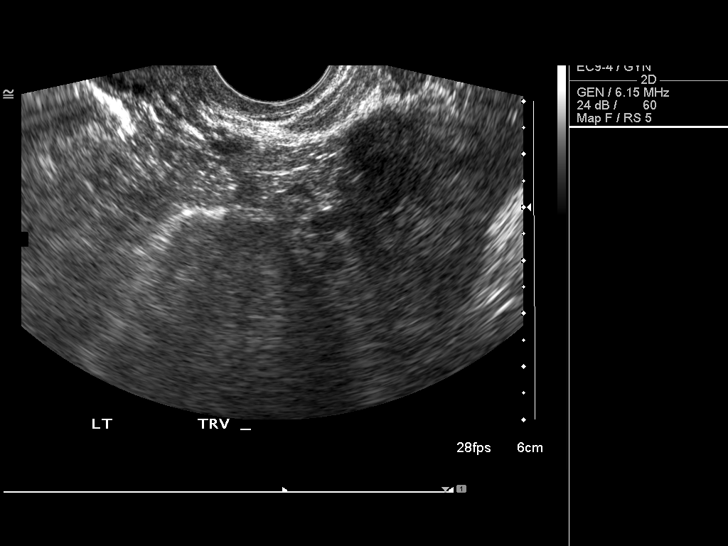
[im 63/69]
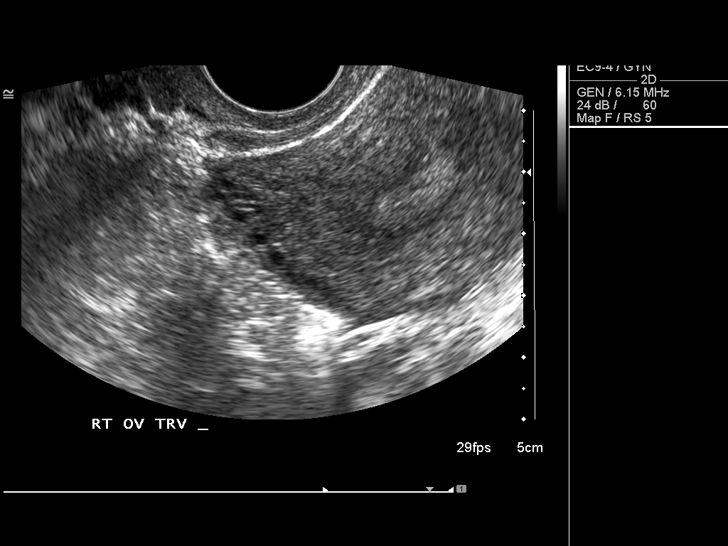
[im 69/69]
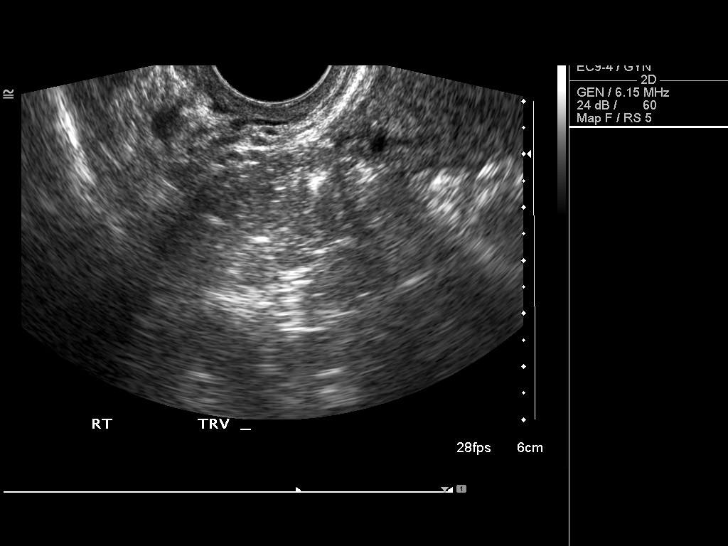

[14 of 25 positions shown; findings below may reference images not displayed]

FINDINGS: Uterus uterus is normal in size and echogenicity measuring 7.0 x
4.3 x 5.4 cm.

Endometrium endometrium is normal in thickness for a premenopausal
female measuring 5 mm.  No evidence of fluid in the canal.

Right Ovary normal measuring 2.8 x 1.2 x 1.5 cm.

Left Ovary normal measuring 2.3 x 1.6 x 1.4 cm.

Other Findings:  No free fluid.
IMPRESSION: No abnormality by pelvic ultrasound.

## 2010-12-04 ENCOUNTER — Other Ambulatory Visit: Payer: Self-pay | Admitting: Family Medicine

## 2010-12-04 DIAGNOSIS — M545 Low back pain, unspecified: Secondary | ICD-10-CM

## 2010-12-04 MED ORDER — CARISOPRODOL 350 MG PO TABS: 350.0000 mg | ORAL_TABLET | Freq: Three times a day (TID) | ORAL | Status: DC

## 2010-12-04 NOTE — Assessment & Plan Note (Signed)
Refilled via fax request. 

## 2010-12-07 ENCOUNTER — Inpatient Hospital Stay (INDEPENDENT_AMBULATORY_CARE_PROVIDER_SITE_OTHER)
Admission: RE | Admit: 2010-12-07 | Discharge: 2010-12-07 | Disposition: A | Discharge status: Home / Self Care | Payer: Medicaid Other | Source: Ambulatory Visit | Attending: Family Medicine | Admitting: Family Medicine

## 2010-12-07 DIAGNOSIS — M542 Cervicalgia: Secondary | ICD-10-CM

## 2011-01-15 LAB — URINALYSIS, ROUTINE W REFLEX MICROSCOPIC
Bilirubin Urine: NEGATIVE
Glucose, UA: NEGATIVE
Hgb urine dipstick: NEGATIVE
Ketones, ur: NEGATIVE
Nitrite: NEGATIVE
Protein, ur: NEGATIVE
Specific Gravity, Urine: 1.015
Urobilinogen, UA: 0.2
pH: 7

## 2011-01-15 LAB — URINE MICROSCOPIC-ADD ON: RBC / HPF: NONE SEEN

## 2011-01-15 LAB — WET PREP, GENITAL
Trich, Wet Prep: NONE SEEN
Yeast Wet Prep HPF POC: NONE SEEN

## 2011-01-15 LAB — URINE CULTURE: Colony Count: >=100000

## 2011-01-15 LAB — GC/CHLAMYDIA PROBE AMP, GENITAL
Chlamydia, DNA Probe: NEGATIVE
GC Probe Amp, Genital: NEGATIVE

## 2011-01-15 LAB — POCT PREGNANCY, URINE
Operator id: 223331
Preg Test, Ur: POSITIVE

## 2011-01-20 LAB — DIFFERENTIAL
Basophils Absolute: 0.1
Basophils Relative: 0
Eosinophils Absolute: 0
Eosinophils Relative: 0
Lymphocytes Relative: 9 — ABNORMAL LOW
Lymphs Abs: 1.9
Monocytes Absolute: 0.7
Monocytes Relative: 4
Neutro Abs: 18.7 — ABNORMAL HIGH
Neutrophils Relative %: 87 — ABNORMAL HIGH

## 2011-01-20 LAB — CBC
HCT: 27.6 — ABNORMAL LOW
HCT: 28.5 — ABNORMAL LOW
HCT: 32.7 — ABNORMAL LOW
Hemoglobin: 11.1 — ABNORMAL LOW
Hemoglobin: 9.3 — ABNORMAL LOW
Hemoglobin: 9.8 — ABNORMAL LOW
MCHC: 33.8
MCHC: 33.8
MCHC: 34.4
MCV: 93.8
MCV: 94.7
MCV: 96.2
Platelets: 172
Platelets: 196
Platelets: 218
RBC: 2.91 — ABNORMAL LOW
RBC: 2.96 — ABNORMAL LOW
RBC: 3.49 — ABNORMAL LOW
RDW: 12.8
RDW: 13.1
RDW: 13.3
WBC: 14 — ABNORMAL HIGH
WBC: 19.2 — ABNORMAL HIGH
WBC: 21.4 — ABNORMAL HIGH

## 2011-01-20 LAB — RPR: RPR Ser Ql: NONREACTIVE

## 2011-02-02 ENCOUNTER — Encounter: Payer: Self-pay | Admitting: Family Medicine

## 2011-02-02 NOTE — Progress Notes (Signed)
  Subjective:    Patient ID: Kiara Murillo, female    DOB: 1980/08/05, 30 y.o.   MRN: [id number]  HPI Received consult report from Dr. Tressie Stalker of [facility].  Kiara Murillo saw him for continued neck and arm symptoms.  Kiara Murillo is considering surgery.  No meds were changed.  He also offered PT, chiropractic care and/or steroid injections.    Review of Systems     Objective:   Physical Exam        Assessment & Plan:

## 2011-02-09 ENCOUNTER — Other Ambulatory Visit: Payer: Self-pay | Admitting: Family Medicine

## 2011-02-09 NOTE — Telephone Encounter (Signed)
Forwarded to pcp.,Heath Gold

## 2011-02-09 NOTE — Telephone Encounter (Signed)
Kiara Murillo is needing a refill on her Percocet.  When she called, she did attempt to get a visit with Dr. Leveda Anna, but it was going to be the middle of November before he could see her.  Since she needed to be seen for a possible UTI, she was scheduled to come in 10/23 with Dr. Earnest Bailey and is hoping Dr. Leveda Anna will prescribe her a refill on her Percocet.  She did make an appt to see Dr. Leveda Anna on 11/14.

## 2011-02-10 ENCOUNTER — Encounter: Payer: Self-pay | Admitting: Family Medicine

## 2011-02-10 ENCOUNTER — Ambulatory Visit (INDEPENDENT_AMBULATORY_CARE_PROVIDER_SITE_OTHER): Payer: Medicaid Other | Admitting: Family Medicine

## 2011-02-10 VITALS — BP 128/77 | HR 85 | Temp 98.1°F | Ht 64.0 in | Wt 118.1 lb

## 2011-02-10 DIAGNOSIS — R300 Dysuria: Secondary | ICD-10-CM

## 2011-02-10 DIAGNOSIS — N390 Urinary tract infection, site not specified: Secondary | ICD-10-CM | POA: Insufficient documentation

## 2011-02-10 LAB — POCT URINALYSIS DIPSTICK
Bilirubin, UA: NEGATIVE
Blood, UA: NEGATIVE
Glucose, UA: NEGATIVE
Ketones, UA: NEGATIVE
Nitrite, UA: POSITIVE
Protein, UA: NEGATIVE
Spec Grav, UA: 1.015
Urobilinogen, UA: 0.2
pH, UA: 6

## 2011-02-10 LAB — POCT UA - MICROSCOPIC ONLY

## 2011-02-10 MED ORDER — SULFAMETHOXAZOLE-TMP DS 800-160 MG PO TABS: 1.0000 | ORAL_TABLET | Freq: Two times a day (BID) | ORAL | Status: AC

## 2011-02-10 NOTE — Assessment & Plan Note (Signed)
Pt with a history of UTIs presents with 1 week history of suprapubic pain and strong urine odor with slight dysuria and urinary frequency. Urinalysis today reveals positive nitrites and trace leukocyte esterase, will send for culture. Pt's last UTI was sensitive to bactrim. She will start 3 day course of bactrim today. Pt advised to drink plenty of water and avoid caffeine. Handout given to pt. She will contact clinic with fever, chills, flank pain, or if symptoms do not improve.

## 2011-02-10 NOTE — Progress Notes (Signed)
  Subjective:    Patient ID: Kiara Murillo, female    DOB: [birth year], 30 y.o.   MRN: [id number]  Urinary Tract Infection  Associated symptoms include frequency. Pertinent negatives include no chills, flank pain, hematuria, nausea, urgency or vomiting.  : 30 year old female with 1 week history of suprapubic pain and urine odor. She reports having a similar episode a few months ago that was diagnosed as UTI in the clinic, pt is unsure if she was given an antibiotic at that time. She reports slight dysuria and urinary frequency, but denies hematuria, flank pain, fever, chills. She denies change in vaginal discharge, no new sexual partners, last menstrual period 3 weeks ago, has had tubal ligation.     Review of Systems  Constitutional: Negative for fever, chills and fatigue.  Respiratory: Negative for cough, shortness of breath and wheezing.   Cardiovascular: Negative for chest pain and palpitations.  Gastrointestinal: Positive for abdominal pain. Negative for nausea, vomiting, diarrhea and constipation.       Suprapubic pain.   Genitourinary: Positive for dysuria and frequency. Negative for urgency, hematuria, flank pain, decreased urine volume, vaginal bleeding, vaginal discharge, difficulty urinating and menstrual problem.  Musculoskeletal: Negative for back pain.  Neurological: Negative for dizziness, syncope and headaches.       Objective:   Physical Exam  Constitutional: She is oriented to person, place, and time. She appears well-developed and well-nourished.  HENT:  Head: Normocephalic and atraumatic.  Neck: Normal range of motion. Neck supple. No thyromegaly present.  Cardiovascular: Normal rate, regular rhythm and normal heart sounds.   Pulmonary/Chest: Effort normal and breath sounds normal. She has no wheezes.  Abdominal: Soft. Bowel sounds are normal. She exhibits no distension. There is tenderness. There is no rebound and no guarding.       Tenderness in suprapubic region.     Lymphadenopathy:    She has no cervical adenopathy.  Neurological: She is alert and oriented to person, place, and time.  Skin: Skin is warm and dry.    I agree with documentation with medical student Toya Smothers    Assessment & Plan:

## 2011-02-10 NOTE — Patient Instructions (Signed)

## 2011-02-10 NOTE — Telephone Encounter (Signed)
Called and discussed.  Denied refill.  Told her I ran an [place] drug database report.  She received my oxycodone RX of 45 tabs on 10/5.  She also received 50 tabs from Dr. Lovell Sheehan on 9/21.  She received 10 tabs from ER on 9/18.  She received 12 tabs on 8/19.  We discussed on phone and will discuss further at her next visit.

## 2011-02-13 LAB — URINE CULTURE: Colony Count: >=100000

## 2011-02-23 ENCOUNTER — Other Ambulatory Visit: Payer: Self-pay | Admitting: Family Medicine

## 2011-02-23 NOTE — Telephone Encounter (Signed)
No early refills.  Keep appointment on 11/14.

## 2011-02-23 NOTE — Telephone Encounter (Signed)
Has an appointment on 11/14, it was about her Percocet.  She doesn't have anymore and would like Dr. Leveda Anna to give her an Rx to last until her appointment.

## 2011-02-23 NOTE — Telephone Encounter (Signed)
LVM for patient to call back. ?

## 2011-02-24 NOTE — Telephone Encounter (Signed)
Spoke with patient and informed her of below 

## 2011-02-26 ENCOUNTER — Encounter (HOSPITAL_BASED_OUTPATIENT_CLINIC_OR_DEPARTMENT_OTHER): Payer: Self-pay | Admitting: Family Medicine

## 2011-02-26 ENCOUNTER — Emergency Department (HOSPITAL_BASED_OUTPATIENT_CLINIC_OR_DEPARTMENT_OTHER)
Admission: EM | Admit: 2011-02-26 | Discharge: 2011-02-26 | Disposition: A | Discharge status: Home / Self Care | Payer: Medicaid Other | Attending: Emergency Medicine | Admitting: Emergency Medicine

## 2011-02-26 DIAGNOSIS — F17200 Nicotine dependence, unspecified, uncomplicated: Secondary | ICD-10-CM | POA: Insufficient documentation

## 2011-02-26 DIAGNOSIS — R110 Nausea: Secondary | ICD-10-CM | POA: Insufficient documentation

## 2011-02-26 DIAGNOSIS — N390 Urinary tract infection, site not specified: Secondary | ICD-10-CM | POA: Insufficient documentation

## 2011-02-26 DIAGNOSIS — R42 Dizziness and giddiness: Secondary | ICD-10-CM | POA: Insufficient documentation

## 2011-02-26 LAB — URINALYSIS, ROUTINE W REFLEX MICROSCOPIC
Bilirubin Urine: NEGATIVE
Glucose, UA: NEGATIVE mg/dL
Hgb urine dipstick: NEGATIVE
Ketones, ur: NEGATIVE mg/dL
Nitrite: POSITIVE — AB
Protein, ur: NEGATIVE mg/dL
Specific Gravity, Urine: 1.01 (ref 1.005–1.030)
Urobilinogen, UA: 0.2 mg/dL (ref 0.0–1.0)
pH: 7 (ref 5.0–8.0)

## 2011-02-26 LAB — COMPREHENSIVE METABOLIC PANEL
ALT: 7 U/L (ref 0–35)
AST: 13 U/L (ref 0–37)
Albumin: 4.3 g/dL (ref 3.5–5.2)
Alkaline Phosphatase: 90 U/L (ref 39–117)
BUN: 6 mg/dL (ref 6–23)
CO2: 27 mEq/L (ref 19–32)
Calcium: 10.3 mg/dL (ref 8.4–10.5)
Chloride: 103 mEq/L (ref 96–112)
Creatinine, Ser: 0.6 mg/dL (ref 0.50–1.10)
GFR calc Af Amer: >90 mL/min (ref >90)
GFR calc non Af Amer: >90 mL/min (ref >90)
Glucose, Bld: 88 mg/dL (ref 70–99)
Potassium: 4.2 mEq/L (ref 3.5–5.1)
Sodium: 140 mEq/L (ref 135–145)
Total Bilirubin: 0.1 mg/dL — ABNORMAL LOW (ref 0.3–1.2)
Total Protein: 7.5 g/dL (ref 6.0–8.3)

## 2011-02-26 LAB — CBC
HCT: 39.6 % (ref 36.0–46.0)
Hemoglobin: 13.2 g/dL (ref 12.0–15.0)
MCH: 32.4 pg (ref 26.0–34.0)
MCHC: 33.3 g/dL (ref 30.0–36.0)
MCV: 97.3 fL (ref 78.0–100.0)
Platelets: 287 10*3/uL (ref 150–400)
RBC: 4.07 MIL/uL (ref 3.87–5.11)
RDW: 12.3 % (ref 11.5–15.5)
WBC: 8.2 10*3/uL (ref 4.0–10.5)

## 2011-02-26 LAB — DIFFERENTIAL
Basophils Absolute: 0 10*3/uL (ref 0.0–0.1)
Basophils Relative: 0 % (ref 0–1)
Eosinophils Absolute: 0 10*3/uL (ref 0.0–0.7)
Eosinophils Relative: 1 % (ref 0–5)
Lymphocytes Relative: 30 % (ref 12–46)
Lymphs Abs: 2.5 10*3/uL (ref 0.7–4.0)
Monocytes Absolute: 0.6 10*3/uL (ref 0.1–1.0)
Monocytes Relative: 7 % (ref 3–12)
Neutro Abs: 5.1 10*3/uL (ref 1.7–7.7)
Neutrophils Relative %: 62 % (ref 43–77)

## 2011-02-26 LAB — URINE MICROSCOPIC-ADD ON

## 2011-02-26 LAB — LIPASE, BLOOD: Lipase: 16 U/L (ref 11–59)

## 2011-02-26 MED ORDER — NITROFURANTOIN MONOHYD MACRO 100 MG PO CAPS
100.0000 mg | ORAL_CAPSULE | Freq: Once | ORAL | Status: AC
  Administered 2011-02-26: 100 mg via ORAL
  Filled 2011-02-26: qty 1

## 2011-02-26 MED ORDER — OXYCODONE-ACETAMINOPHEN 5-325 MG PO TABS
1.0000 | ORAL_TABLET | Freq: Once | ORAL | Status: AC
  Administered 2011-02-26: 1 via ORAL
  Filled 2011-02-26: qty 1

## 2011-02-26 MED ORDER — ONDANSETRON HCL 4 MG PO TABS: 4.0000 mg | ORAL_TABLET | Freq: Four times a day (QID) | ORAL | Status: DC

## 2011-02-26 MED ORDER — ONDANSETRON HCL 8 MG PO TABS: 4.0000 mg | ORAL_TABLET | Freq: Three times a day (TID) | ORAL | Status: AC | PRN

## 2011-02-26 MED ORDER — LORAZEPAM 2 MG/ML IJ SOLN
1.0000 mg | Freq: Once | INTRAMUSCULAR | Status: AC
  Administered 2011-02-26: 1 mg via INTRAVENOUS
  Filled 2011-02-26: qty 1

## 2011-02-26 MED ORDER — SODIUM CHLORIDE 0.9 % IV BOLUS (SEPSIS)
1000.0000 mL | Freq: Once | INTRAVENOUS | Status: AC
  Administered 2011-02-26: 1000 mL via INTRAVENOUS

## 2011-02-26 MED ORDER — NITROFURANTOIN MONOHYD MACRO 100 MG PO CAPS: 100.0000 mg | ORAL_CAPSULE | Freq: Two times a day (BID) | ORAL | Status: AC

## 2011-02-26 MED ORDER — NITROFURANTOIN MONOHYD MACRO 100 MG PO CAPS: 100.0000 mg | ORAL_CAPSULE | Freq: Two times a day (BID) | ORAL | Status: DC

## 2011-02-26 NOTE — ED Notes (Signed)
Pt amb to room 4 with quick steady gait smiling in nad. Pt reports feeling dizzy for several days, had n/v 2 days ago, none since, denies diarrhea, c/o bilateral flank pain today. Pt amb to bathroom for cc urine specimen.

## 2011-02-26 NOTE — ED Provider Notes (Signed)
History     CSN: [id number] Arrival date & time: 02/26/2011  3:11 PM   First MD Initiated Contact with Patient 02/26/11 1516      Chief Complaint  Patient presents with  . Dizziness    (Consider location/radiation/quality/duration/timing/severity/associated sxs/prior treatment) HPI The patient presents with multiple complaints. She was in her usual state of health, including taking her typical doses of Xanax and Percocet and told 4 days ago. Her symptoms began gradually since onset has been persistent, progressive. She complains of dizziness him a mild nausea, and several episodes of emesis over this timeframe. No clear provocative factors, and symptoms seem better with rest. No confusion, disorientation, chest pain, no dyspnea. No abdominal pain. The patient also had one episode of diarrhea just today. No fever, no chills, no dysuria. History reviewed. No pertinent past medical history.  Past Surgical History  Procedure Date  . Cesarean section   . Tonsillectomy   . Tubal ligation   . Mouth surgery     No family history on file.  History  Substance Use Topics  . Smoking status: Current Everyday Smoker -- 1.0 packs/day    Types: Cigarettes  . Smokeless tobacco: Never Used  . Alcohol Use: No    OB History    Grav Para Term Preterm Abortions TAB SAB Ect Mult Living                  Review of Systems Gen: Per HPI HEENT: No HA CV: No CP Resp: No dyspnea Abd: Per HPI, otherwise negative Musk: Per HPI, otherwise negative Neuro: No dysesthesia, or focal changes GU: Per HPI, otherwise negative Skin: Neg Psych: Neg  Allergies  Gabapentin; Hydrocodone; Tramadol; and Metronidazole  Home Medications   Current Outpatient Rx  Name Route Sig Dispense Refill  . ALPRAZOLAM 0.5 MG PO TABS Oral Take 0.5 mg by mouth 2 (two) times daily as needed. For anxiety and sleep     . CARISOPRODOL 350 MG PO TABS Oral Take 1 tablet (350 mg total) by mouth 3 (three) times daily. 60  tablet 5  . IBUPROFEN 800 MG PO TABS Oral Take 800 mg by mouth 3 (three) times daily.     . OXYCODONE-ACETAMINOPHEN 5-325 MG PO TABS Oral Take 1 tablet by mouth every 4 (four) hours as needed. For pain      . CAPSAICIN 0.025 % EX CREA Topical Apply 1 application topically once as needed. For pain       BP 115/74  Pulse 90  Temp(Src) 98.2 F (36.8 C) (Oral)  Resp 16  Ht 5\' 4"  (1.626 m)  Wt 120 lb (54.432 kg)  BMI 20.60 kg/m2  SpO2 99%  LMP 02/22/2011  Physical Exam  Constitutional: She is oriented to person, place, and time. She appears well-developed and well-nourished.  HENT:  Head: Normocephalic and atraumatic.  Eyes: EOM are normal.  Cardiovascular: Normal rate and regular rhythm.   Pulmonary/Chest: Effort normal and breath sounds normal.  Abdominal: She exhibits no distension.  Musculoskeletal: She exhibits no edema and no tenderness.  Neurological: She is alert and oriented to person, place, and time.  Skin: Skin is warm and dry.    ED Course  Procedures (including critical care time)   Labs Reviewed  CBC  DIFFERENTIAL  COMPREHENSIVE METABOLIC PANEL  LIPASE, BLOOD  URINALYSIS, ROUTINE W REFLEX MICROSCOPIC   No results found.   No diagnosis found.  Results for orders placed during the hospital encounter of 02/26/11  CBC  Component Value Range   WBC 8.2  4.0 - 10.5 (K/uL)   RBC 4.07  3.87 - 5.11 (MIL/uL)   Hemoglobin 13.2  12.0 - 15.0 (g/dL)   HCT 16.1  09.6 - 04.5 (%)   MCV 97.3  78.0 - 100.0 (fL)   MCH 32.4  26.0 - 34.0 (pg)   MCHC 33.3  30.0 - 36.0 (g/dL)   RDW 40.9  81.1 - 91.4 (%)   Platelets 287  150 - 400 (K/uL)  DIFFERENTIAL      Component Value Range   Neutrophils Relative 62  43 - 77 (%)   Neutro Abs 5.1  1.7 - 7.7 (K/uL)   Lymphocytes Relative 30  12 - 46 (%)   Lymphs Abs 2.5  0.7 - 4.0 (K/uL)   Monocytes Relative 7  3 - 12 (%)   Monocytes Absolute 0.6  0.1 - 1.0 (K/uL)   Eosinophils Relative 1  0 - 5 (%)   Eosinophils Absolute  0.0  0.0 - 0.7 (K/uL)   Basophils Relative 0  0 - 1 (%)   Basophils Absolute 0.0  0.0 - 0.1 (K/uL)  URINALYSIS, ROUTINE W REFLEX MICROSCOPIC      Component Value Range   Color, Urine YELLOW  YELLOW    Appearance CLOUDY (*) CLEAR    Specific Gravity, Urine 1.010  1.005 - 1.030    pH 7.0  5.0 - 8.0    Glucose, UA NEGATIVE  NEGATIVE (mg/dL)   Hgb urine dipstick NEGATIVE  NEGATIVE    Bilirubin Urine NEGATIVE  NEGATIVE    Ketones, ur NEGATIVE  NEGATIVE (mg/dL)   Protein, ur NEGATIVE  NEGATIVE (mg/dL)   Urobilinogen, UA 0.2  0.0 - 1.0 (mg/dL)   Nitrite POSITIVE (*) NEGATIVE    Leukocytes, UA TRACE (*) NEGATIVE   URINE MICROSCOPIC-ADD ON      Component Value Range   Squamous Epithelial / LPF MANY (*) RARE    WBC, UA 7-10  <3 (WBC/hpf)   Bacteria, UA MANY (*) RARE    No results found.    MDM  This generally well-appearing 30 year old female who is afebrile and in no distress on initial exam presents with several days of dizziness, and mild nausea. Labs notable for UTI. No evidence of renal dysfunction. The patient will be discharged with antiemetics and Macrobid        Gerhard Munch, MD 02/26/11 1625

## 2011-02-26 NOTE — ED Notes (Signed)
Pt c/o dizziness x 4 days with nausea x 2 days. Pt also c/o low back pain since this morning.

## 2011-03-04 ENCOUNTER — Encounter: Payer: Self-pay | Admitting: Family Medicine

## 2011-03-04 ENCOUNTER — Ambulatory Visit (INDEPENDENT_AMBULATORY_CARE_PROVIDER_SITE_OTHER): Payer: Medicaid Other | Admitting: Family Medicine

## 2011-03-04 VITALS — BP 122/74 | HR 96 | Temp 98.3°F | Ht 64.0 in | Wt 117.0 lb

## 2011-03-04 DIAGNOSIS — M545 Low back pain, unspecified: Secondary | ICD-10-CM

## 2011-03-04 DIAGNOSIS — G894 Chronic pain syndrome: Secondary | ICD-10-CM

## 2011-03-04 DIAGNOSIS — F411 Generalized anxiety disorder: Secondary | ICD-10-CM

## 2011-03-04 MED ORDER — CARISOPRODOL 350 MG PO TABS: 350.0000 mg | ORAL_TABLET | Freq: Three times a day (TID) | ORAL | Status: DC

## 2011-03-04 MED ORDER — HYDROCODONE-ACETAMINOPHEN 5-325 MG PO TABS: 1.0000 | ORAL_TABLET | Freq: Four times a day (QID) | ORAL | Status: AC | PRN

## 2011-03-04 MED ORDER — ALPRAZOLAM 0.5 MG PO TABS: 0.5000 mg | ORAL_TABLET | Freq: Two times a day (BID) | ORAL | Status: DC | PRN

## 2011-03-04 NOTE — Patient Instructions (Addendum)
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  It means that your kneecap (patella) does not glide smoothly over your knee bones (femur). I plan to have you on less medicine over time.

## 2011-03-04 NOTE — Assessment & Plan Note (Signed)
Renew alprazolam

## 2011-03-05 NOTE — Progress Notes (Signed)
  Subjective:    Patient ID: Kiara Murillo, female    DOB: 1980-11-18, 30 y.o.   MRN: [id number]  HPI  Discussed her knee pain.  She wanted to know the name. Majority of visit was spent discussing her chronic pain and how she is not progressing well in her life.  She has 4 small children, is often depressed and easily snaps.  She feels more and more general discomfort (low back, pelvic, neck and bilateral arm.  The neck pain is slightly different in that she has spondylolysis and nerve root impingement and followed by neurosurg.)  Also still smokes and does not exercise.  On the positive note, she has maintained a very healthy weight.  Social factors enter.  She supports her mother in caring for her grandmother who has Alzheimer's disease.  The family is also likely to lose their homes (multiple generations live on the same track of land) to road construction      Review of Systems     Objective:   Physical Exam Unchanged from previous.       Assessment & Plan:

## 2011-03-05 NOTE — Assessment & Plan Note (Addendum)
For now will treat with hydrocodone (she chose this even though she says it makes her headaches worse.  Told that in the long run, I plan to have her on less meds.

## 2011-03-06 ENCOUNTER — Other Ambulatory Visit: Payer: Self-pay | Admitting: Family Medicine

## 2011-03-06 NOTE — Telephone Encounter (Signed)
Refill request

## 2011-03-20 ENCOUNTER — Other Ambulatory Visit: Payer: Self-pay | Admitting: Neurosurgery

## 2011-03-20 ENCOUNTER — Telehealth: Payer: Self-pay | Admitting: Family Medicine

## 2011-03-20 ENCOUNTER — Other Ambulatory Visit (HOSPITAL_COMMUNITY): Payer: Self-pay | Admitting: Neurosurgery

## 2011-03-20 DIAGNOSIS — M5412 Radiculopathy, cervical region: Secondary | ICD-10-CM

## 2011-03-20 NOTE — Telephone Encounter (Signed)
Went to ED in [place] 2 days ago for leg pain/numbness.  Was told that if the pain started radiating in the groin area to come in immediately.  Has an appt on Monday but is not sure if she should wait.  Would like to talk to speak to nurse

## 2011-03-20 NOTE — Telephone Encounter (Signed)
Went to ED on Wednesday due to left leg and buttock feeling numb & having pain.  Now c/o numbness spreading to genital area.  Still having back and left hip pain.  Pain is 5/10 when sitting and 8/10 when moving around. Taking ibuprofen 800 mg every 6 hours.  Not really helping with pain.  Will route to preceptor to see if patient needs to be seen today and call patient back.

## 2011-03-20 NOTE — Telephone Encounter (Signed)
Returned call to patient.  She denies fever/chills or involvement of both legs and is ok with waiting to be seen at office visit on Monday 03/23/11. Gaylene Brooks, RN

## 2011-03-20 NOTE — Telephone Encounter (Signed)
If no fever/chills or involvement of both legs, then it is all right for patient to wait until her appointment Monday.  If fever/chills or involvement of both legs, then patient should be seen at [facility] this afternoon.

## 2011-03-23 ENCOUNTER — Encounter: Payer: Self-pay | Admitting: Family Medicine

## 2011-03-23 ENCOUNTER — Ambulatory Visit (INDEPENDENT_AMBULATORY_CARE_PROVIDER_SITE_OTHER): Payer: Medicaid Other | Admitting: Family Medicine

## 2011-03-23 VITALS — BP 114/79 | HR 102 | Temp 98.2°F | Ht 64.0 in | Wt 114.0 lb

## 2011-03-23 DIAGNOSIS — M47817 Spondylosis without myelopathy or radiculopathy, lumbosacral region: Secondary | ICD-10-CM

## 2011-03-23 NOTE — Patient Instructions (Signed)
Make follow-up with Dr. Leveda Anna to talk about your chronic pain and your many potions for helping to lessen its impact on your life  Back Pain, Adult Back pain is very common. The pain often gets better over time. The cause of back pain is usually not dangerous. Most people can learn to manage their back pain on their own.   HOME CARE    Stay active. Start with short walks on flat ground if you can. Try to walk farther each day.     Do not sit, drive, or stand in one place for more than 30 minutes. Do not stay in bed.     Do not avoid exercise or work. Activity can help your back heal faster.     Be careful when you bend or lift an object. Bend at your knees, keep the object close to you, and do not twist.     Sleep on a firm mattress. Lie on your side, and bend your knees. If you lie on your back, put a pillow under your knees.     Only take medicines as told by your doctor.     Put ice on the injured area.     Put ice in a plastic bag.     Place a towel between your skin and the bag.     Leave the ice on for 15 to 20 minutes, 3 to 4 times a day for the first 2 to 3 days. After that, you can switch between ice and heat packs.     Ask your doctor about back exercises or massage.     Avoid feeling anxious or stressed. Find good ways to deal with stress, such as exercise.  GET HELP RIGHT AWAY IF:    Your pain does not go away with rest or medicine.     Your pain does not go away in 1 week.     You have new problems.     You do not feel well.     The pain spreads into your legs.     You cannot control when you poop (bowel movement) or pee (urinate).     Your arms or legs feel weak or lose feeling (numbness).     You feel sick to your stomach (nauseous) or throw up (vomit).     You have belly (abdominal) pain.     You feel like you may pass out (faint).  MAKE SURE YOU:    Understand these instructions.     Will watch your condition.     Will get help right away if  you are not doing well or get worse.  Document Released: 09/23/2007 Document Revised: 12/17/2010 Document Reviewed: 08/25/2010 [facility] Patient Information 2012 [place], [place].

## 2011-03-23 NOTE — Assessment & Plan Note (Addendum)
Acute on chronic low back pain.  No evidence of neurologic compromise today.  Symptoms possibly represent neurogenic claudication.   Patient recently weaned from narcotics by PCP.  Is currently on Soma and ibuprofen.  Cannot tolerate tramadol.  Discussed acetaminophen usage and long term therapies such as PT, treatment of depression to improve coping, use of SNRI as possible adjunct to modulate pain.  Encouraged her to discuss long term treatment course with PCP and neurosurgeon.  Will have diagnostic cervical testing with NS tomorrow, and will see PCP in 9 days.

## 2011-03-23 NOTE — Progress Notes (Signed)
  Subjective:    Patient ID: Kiara Murillo, female    DOB: 04-Nov-1980, 30 y.o.   MRN: [id number]  HPI Here for urgent appointment for evaluation of low back pain and legs "not feeling right"  States she lifted a heavy TV 2-4 days ago and had a flare of lowe back pain. Pain is worst in left lower lumbar area which is site of her chronic low back pain.  She also has cervical spine spondylosis and is schedule for a CT myelogram tomorrow.  Describes numbness as located encompassing her entire left anterior thigh and at times affecting both feet with numbness and tingling.  States when she walks long distances, she begins to have vague numbness on her right inner thigh and groin area.  NO weakness, change in bowel or bladder habits.   Review of Systems See HPI    Objective:   Physical Exam GEN:  Here with family.  NAD.  Ambulating without antalgic gait, Back:  Mildly tender in left lumbar paraspinal muscles.  Strength 5/5 in upper and lower extremities.  Sensation to light touch intact in lower extremities.  Reflexes equal bilaterally biceps, patella.  No clonus.  States does not currently have symptoms of numbness and tingling.        Assessment & Plan:

## 2011-03-24 ENCOUNTER — Ambulatory Visit (HOSPITAL_COMMUNITY)
Admission: RE | Admit: 2011-03-24 | Discharge: 2011-03-24 | Disposition: A | Discharge status: Home / Self Care | Payer: Medicaid Other | Source: Ambulatory Visit | Attending: Neurosurgery | Admitting: Neurosurgery

## 2011-03-24 ENCOUNTER — Other Ambulatory Visit (HOSPITAL_COMMUNITY): Payer: Self-pay | Admitting: Neurosurgery

## 2011-03-24 ENCOUNTER — Other Ambulatory Visit: Payer: Self-pay | Admitting: Neurosurgery

## 2011-03-24 DIAGNOSIS — M5440 Lumbago with sciatica, unspecified side: Secondary | ICD-10-CM

## 2011-03-24 DIAGNOSIS — M5412 Radiculopathy, cervical region: Secondary | ICD-10-CM

## 2011-03-24 DIAGNOSIS — R209 Unspecified disturbances of skin sensation: Secondary | ICD-10-CM | POA: Insufficient documentation

## 2011-03-24 DIAGNOSIS — M25519 Pain in unspecified shoulder: Secondary | ICD-10-CM | POA: Insufficient documentation

## 2011-03-24 MED ORDER — IOHEXOL 300 MG/ML  SOLN
10.0000 mL | Freq: Once | INTRAMUSCULAR | Status: AC | PRN
  Administered 2011-03-24: 10 mL via INTRATHECAL

## 2011-03-24 MED ORDER — ONDANSETRON HCL 4 MG/2ML IJ SOLN: 4.0000 mg | Freq: Four times a day (QID) | INTRAMUSCULAR | Status: DC | PRN

## 2011-03-24 MED ORDER — DIAZEPAM 5 MG PO TABS
10.0000 mg | ORAL_TABLET | Freq: Once | ORAL | Status: AC
  Administered 2011-03-24: 10 mg via ORAL

## 2011-03-24 MED ORDER — OXYCODONE-ACETAMINOPHEN 5-325 MG PO TABS
ORAL_TABLET | ORAL | Status: AC
  Filled 2011-03-24: qty 1

## 2011-03-24 MED ORDER — MORPHINE SULFATE 4 MG/ML IJ SOLN: 4.0000 mg | INTRAMUSCULAR | Status: DC | PRN

## 2011-03-24 MED ORDER — OXYCODONE-ACETAMINOPHEN 5-325 MG PO TABS
1.0000 | ORAL_TABLET | ORAL | Status: DC | PRN
  Administered 2011-03-24: 1 via ORAL

## 2011-03-25 ENCOUNTER — Telehealth: Payer: Self-pay | Admitting: Family Medicine

## 2011-03-25 NOTE — Telephone Encounter (Signed)
Needs to talk to nurse about medicaid won't authorize her muscle relaxer

## 2011-03-26 NOTE — Telephone Encounter (Signed)
Spoke with patient and informed her that in order to get the Percocet refilled she is going to have to come in for an office visit. I set that up for 12/7 @ 8:45 am. Patient was advised to come at 8:30 am. Patient states that she has had the Percocet filled before and that she should not have to come in for a visit. I explained to her that she will need to come in per office policy.  Spoke with Tammie, a pharmacist @ [facility] who is helping there. She says that a rx was called in on 12/3 for SOMA was picked up by patient. Also the pharmacists had spoke about this and informed my that patient has been using "multiple" pharmacies. [facility] is [facility] (SOMA) and when I spoke with them they said that they never got this from her and that they were "very familiar" with her and how she tries to get multiple rxs filled.   I asked patient about the messages below and she said that first insurance would not authorize this medication until they spoke with you. Then when I told her I have never heard this happening before with her rx's she said "oh no the [facility] lost this [facility]".   She is coming in tomorrow and she was also informed that medication may not be prescribed at this visit.

## 2011-03-26 NOTE — Telephone Encounter (Signed)
Pt calling back, says [facility] relaxer, also asking if MD can prescribe something (requested percocet) for her low back pain & has a headache.

## 2011-03-27 ENCOUNTER — Ambulatory Visit (INDEPENDENT_AMBULATORY_CARE_PROVIDER_SITE_OTHER): Payer: Medicaid Other | Admitting: Family Medicine

## 2011-03-27 VITALS — BP 107/77 | HR 106 | Ht 64.0 in | Wt 115.0 lb

## 2011-03-27 DIAGNOSIS — G971 Other reaction to spinal and lumbar puncture: Secondary | ICD-10-CM

## 2011-03-27 DIAGNOSIS — G894 Chronic pain syndrome: Secondary | ICD-10-CM

## 2011-03-27 MED ORDER — KETOROLAC TROMETHAMINE 10 MG PO TABS: 10.0000 mg | ORAL_TABLET | Freq: Four times a day (QID) | ORAL | Status: AC | PRN

## 2011-03-27 NOTE — Telephone Encounter (Signed)
Agree and will address face to face with patient.

## 2011-03-27 NOTE — Patient Instructions (Signed)
I do plan to give you any more narcotics for pain. We can continue the Soma if medicaid will cover it. You can only take the new medicine, ketoralac, for 5 days.  Then you must take a five day break.  Do not take ibuprofen when you are taking the ketoralac.

## 2011-04-01 ENCOUNTER — Ambulatory Visit: Payer: Medicaid Other | Admitting: Family Medicine

## 2011-04-01 ENCOUNTER — Encounter: Payer: Self-pay | Admitting: Family Medicine

## 2011-04-01 DIAGNOSIS — G971 Other reaction to spinal and lumbar puncture: Secondary | ICD-10-CM | POA: Insufficient documentation

## 2011-04-01 NOTE — Assessment & Plan Note (Signed)
Apparently now considering blood patch.  While her symptoms are a good fit for spinal HA, I urged her caution in proceeding with blood patch.  It involves another lumbar needle.  Also, given her various chronic pains, I cannot be absolutely certain this is not simply a worsening of her chronic headaches.

## 2011-04-01 NOTE — Progress Notes (Signed)
  Subjective:    Patient ID: Kiara Murillo, female    DOB: [birth year], 30 y.o.   MRN: [id number]  HPI Patient continues to do poorly with pain.  Continues with neck and back pain - to the point her neurosurgeon proceeded with myelogram.  She has spondylolosis in neck and back.  No critical compression.  She states the myelogram needle was "horrible" and now complains of headache worse when upright.  She is drinking fluids and staying supine.  She did have preexisting headaches but this one is different.      Review of Systems     Objective:   Physical Exam CN 2-12 intact Motor and sensory grossly intact. Normal cognition and LOC EOMI, fundus normal         Assessment & Plan:

## 2011-04-01 NOTE — Assessment & Plan Note (Signed)
Continue plan to wean off narcotics.

## 2011-04-07 ENCOUNTER — Other Ambulatory Visit: Payer: Self-pay | Admitting: Family Medicine

## 2011-04-07 NOTE — Telephone Encounter (Signed)
Will forward to Dr Hensel 

## 2011-04-07 NOTE — Telephone Encounter (Signed)
Wants to know if MD can write rx for percocet to last her thru new years. Says neurosurgeon is suppose to be sending over info on new problems with her back.

## 2011-04-07 NOTE — Telephone Encounter (Signed)
Called and discussed.  Fortunately the headache has improved and she will not need a blood patch.  Visited the neurosurgeon yesterday.  He is concerned about lumbar spondylolisthesis.  He plans referral to pain management for possible epidural steroid injections.  Surgery remains an option but no one (myself, patient or neurosurg) is excited about that option.  No intervention on my part at this point.

## 2011-04-15 ENCOUNTER — Other Ambulatory Visit: Payer: Self-pay | Admitting: Family Medicine

## 2011-04-15 DIAGNOSIS — M545 Low back pain, unspecified: Secondary | ICD-10-CM

## 2011-04-15 NOTE — Telephone Encounter (Signed)
Kiara Murillo wants her meds to go to [facility] in [place], most specifically on her muscle relaxer.

## 2011-04-16 NOTE — Telephone Encounter (Signed)
LVM for patient to call back to find out from her which pharmacy the [facility] and Xanax are going to. This looks like the 2 active scripts right now written by Dr. Leveda Anna. So that we can cancel those, once these are cancelled we will contact Dr. Leveda Anna and then proceed with the switching over of the scripts.  There are 2 other scripts that are going to the [facility] pharmacy in [place], but those have been written by another doctor Gerhard Munch, MD) these medications written by this doctor that are there are Zofran and Macrobid.

## 2011-04-17 ENCOUNTER — Telehealth: Payer: Self-pay | Admitting: *Deleted

## 2011-04-17 MED ORDER — CARISOPRODOL 350 MG PO TABS: 350.0000 mg | ORAL_TABLET | Freq: Three times a day (TID) | ORAL | Status: DC

## 2011-04-17 MED ORDER — ALPRAZOLAM 0.5 MG PO TABS: 0.5000 mg | ORAL_TABLET | Freq: Two times a day (BID) | ORAL | Status: DC

## 2011-04-17 NOTE — Telephone Encounter (Signed)
Spoke with patient and she was informed that refills were called in to the [facility] [address]

## 2011-04-17 NOTE — Telephone Encounter (Signed)
Dr. Leveda Anna,  Did some checking up on this issue----  Soma was written on 11/14 with 5 refills and printed out. Patient confirms this but says that the pharmacy says she has none on this. So i called pharmacy and they said that the only one they have on file in the one we called in for patient

## 2011-04-17 NOTE — Telephone Encounter (Signed)
Spoke with patient and she stated that [facility] but she is needing refills on it. In her chart it shows it was written 03/04/2011 and that it had 5 refills on it. On the Xanax she states that it is at the [facility] on [address] in [place] and that is the one she is wanting transferred over, also again she says this has no refills. I looked at this script written and it was filled 03/06/2011 with 5 refills. Both were printed and handed to patient. She confirmed that also it was handed to her. She should have refills on both of these still. I am going to call both of these pharmacies and confirm/check on this.

## 2011-04-17 NOTE — Telephone Encounter (Signed)
I greatly appreciate the excellent detective work done by [employer].  I intend to continue the generic soma and xanex AND I want to be vigilant for misuse.  I have entered the new prescriptions as "no print."  I will ask nurse to call the prescriptions to her new pharmacy.

## 2011-04-17 NOTE — Telephone Encounter (Signed)
Dr. Leveda Anna,  Did some checking up on this issue----   Soma was written on 11/14 with 5 refills and printed out. Patient confirms this but says that the pharmacy says she has none on this. So i called pharmacy and they said that the only one they have on file in the one we called in for patient 12/3 and they "never" received this rx. Patient still says that it was turned in, and she is told there are no refills. Is there a way we can check where this script written on 03/04/2011 with 5 refills is at. [facility] and they do not have that written script.  Xanax issue. This is the [facility] that [facility]. I called them and verified what patient told me about having no refills on this either. They said she has a script there already with 3 refills "AND" the rx written 11/16 with 5 refills on it "BOTH" active and the last one filled was on 04/06/2011. So I discontinued both of  them because this is the one she wanted transferred. The Xanax scripts there had a written date of 03/04/2011 and 03/06/2011.   ----Huntley Dec

## 2011-04-24 ENCOUNTER — Telehealth: Payer: Self-pay | Admitting: Family Medicine

## 2011-04-24 DIAGNOSIS — G894 Chronic pain syndrome: Secondary | ICD-10-CM

## 2011-04-24 NOTE — Telephone Encounter (Signed)
Patient calling inquiring about results from [facility] and what the next step is going to be.  She is in a lot of pain and would like for something to be done quickly.

## 2011-04-27 NOTE — Telephone Encounter (Signed)
Dr. Leveda Anna, I don't see where we were referring her to [facility]. Do you know anything about that? ----Huntley Dec

## 2011-04-27 NOTE — Telephone Encounter (Signed)
Made in error

## 2011-04-27 NOTE — Telephone Encounter (Signed)
[facility] has been following her for her neck and back pain.  They have decided that no surgery is indicated and suggested she go to a pain center.  I am happy to refer her to pain clinic if that is what she desires.

## 2011-04-28 NOTE — Telephone Encounter (Signed)
Dr. Leveda Anna, So ok I totally read that wrong in some way. =) Do we know what the results are from [facility]? If so I will be more than happy to give those to her so you do not have to. And then I will do a pain clinic referral if that is what she would like. ----Huntley Dec

## 2011-04-29 NOTE — Assessment & Plan Note (Signed)
Neurosurg does not plan any surgical intervention.  Will refer to pain center.  Also, she informed me she needs a note with her medical problems in that she will be seeking social security disability.

## 2011-04-30 IMAGING — CR DG CHEST 2V
2 series · 2 of 2 positions shown · non-contrast
Comparison: None.

***ADDENDUM*** CREATED: 05/15/2010 [DATE]

Repeat PA view the chest with external clothing removed from the
upper chest is now provided.
The mass-like opacity in the left apex does NOT persist.  Otherwise
stable chest, the lungs are clear.
CLINICAL DATA: 29-year-old female with cough, pain.  History of
smoking.
CHEST - 2 VIEW

[w chest pa]
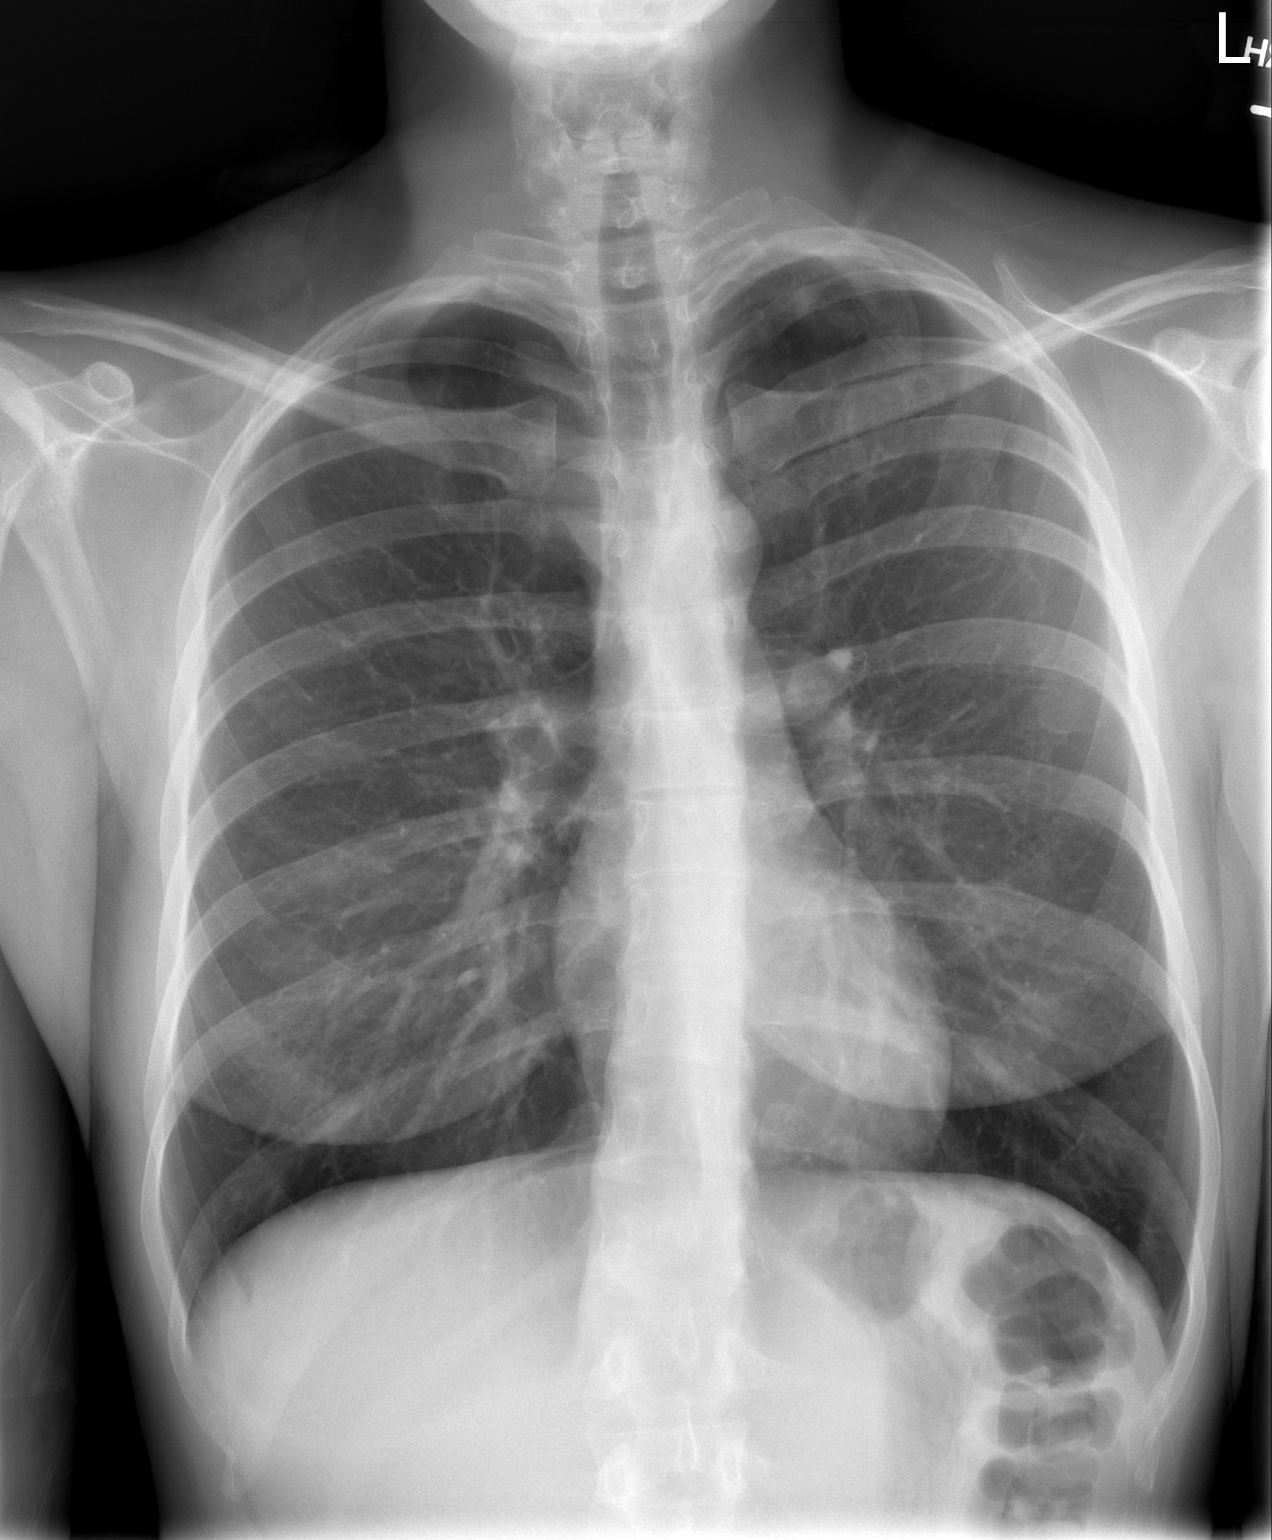

[w chest lat]
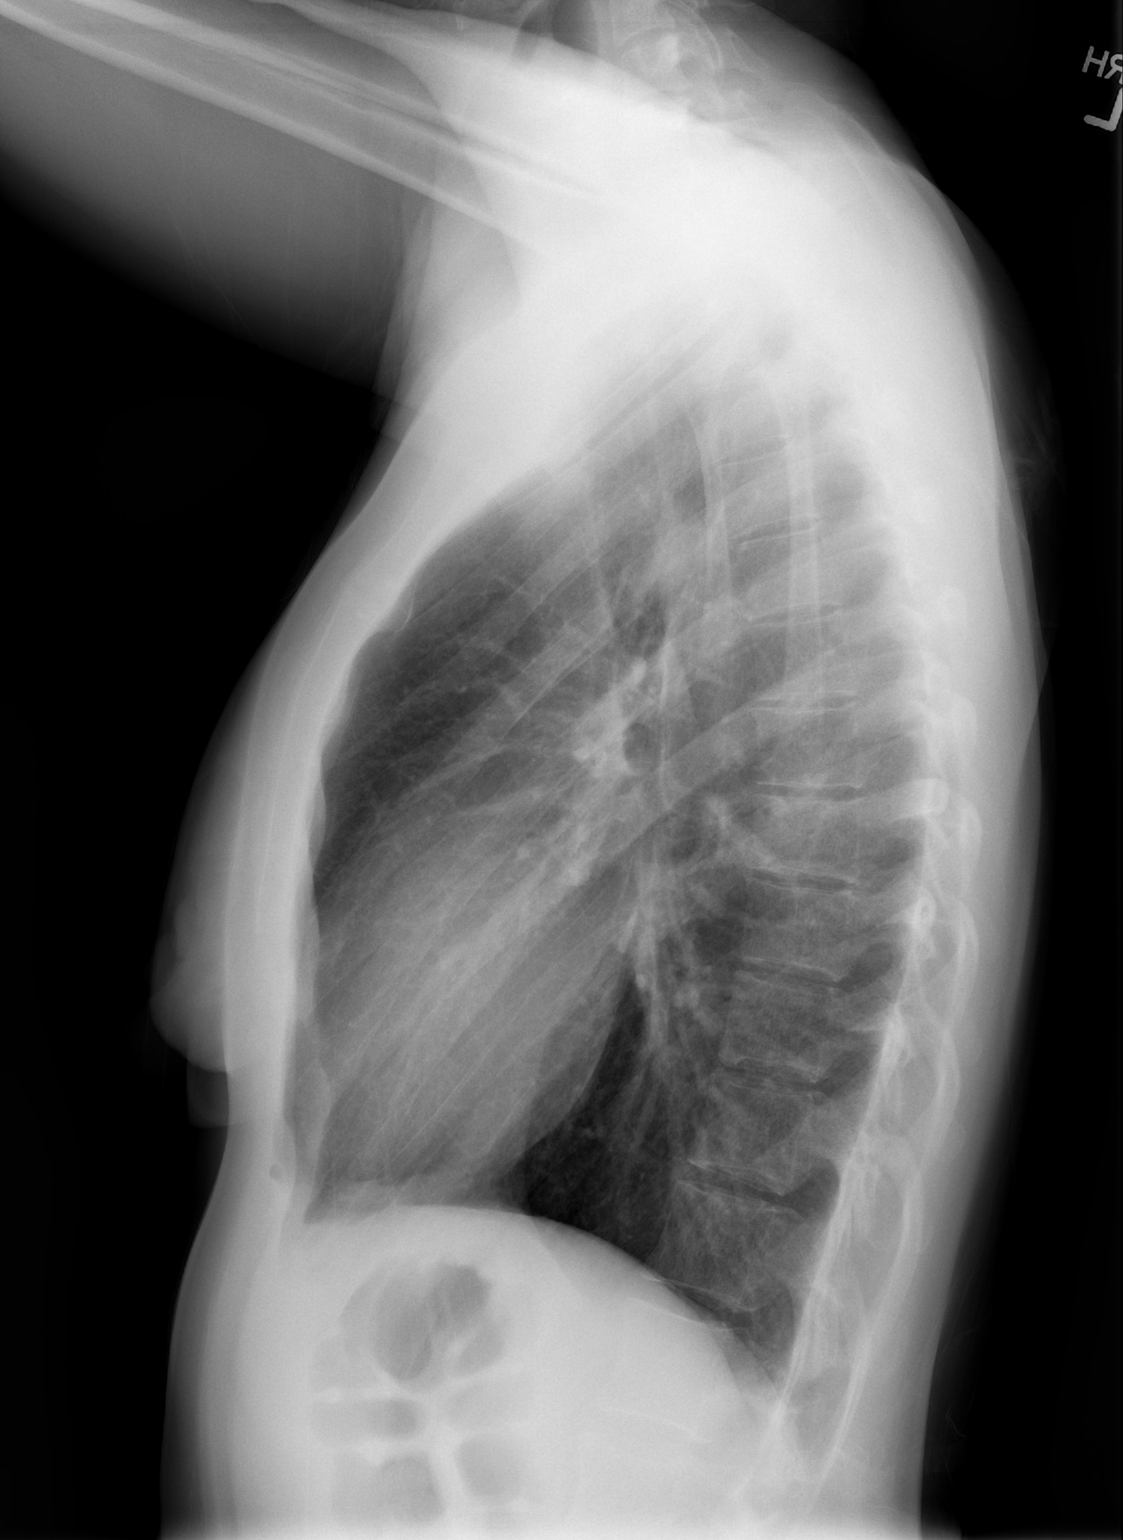

[2 of 2 positions shown; findings below may reference images not displayed]

FINDINGS: Mass-like spiculated opacity in the left apex projects
over the posterior third rib and measures 9 mm, but may be
associated with an external strap of some kind which is seen at the
left lower neck.

Lung parenchyma otherwise appears within normal limits. Normal
cardiac size and mediastinal contours.  Visualized tracheal air
column is within normal limits.  No pneumothorax or effusion. No
acute osseous abnormality identified.  Negative visualized bowel
gas pattern.
IMPRESSION: 1.   Mass-like opacity in the left lung apex, but there is a good
chance this is related to external clothing artifact.  Recommend
repeat PA view with all clothing removed from this area.  If the
lesion persists, a follow-up chest CT would then be recommended.
2.  Otherwise no acute cardiopulmonary abnormality.

## 2011-05-01 NOTE — Telephone Encounter (Signed)
Has this been addressed?

## 2011-05-04 NOTE — Telephone Encounter (Signed)
Faxed referral to [facility]

## 2011-05-08 NOTE — Telephone Encounter (Signed)
Faxed referral to [facility]

## 2011-05-09 ENCOUNTER — Emergency Department (INDEPENDENT_AMBULATORY_CARE_PROVIDER_SITE_OTHER)
Admission: EM | Admit: 2011-05-09 | Discharge: 2011-05-09 | Disposition: A | Discharge status: Nursing Home (Includes ICF) | Payer: Medicaid Other | Source: Home / Self Care | Attending: Family Medicine | Admitting: Family Medicine

## 2011-05-09 ENCOUNTER — Encounter (HOSPITAL_COMMUNITY): Payer: Self-pay | Admitting: *Deleted

## 2011-05-09 ENCOUNTER — Emergency Department (HOSPITAL_COMMUNITY): Payer: Medicaid Other

## 2011-05-09 ENCOUNTER — Emergency Department (HOSPITAL_COMMUNITY)
Admission: EM | Admit: 2011-05-09 | Discharge: 2011-05-09 | Disposition: A | Discharge status: Home / Self Care | Payer: Medicaid Other | Attending: Emergency Medicine | Admitting: Emergency Medicine

## 2011-05-09 DIAGNOSIS — N949 Unspecified condition associated with female genital organs and menstrual cycle: Secondary | ICD-10-CM | POA: Insufficient documentation

## 2011-05-09 DIAGNOSIS — N760 Acute vaginitis: Secondary | ICD-10-CM | POA: Insufficient documentation

## 2011-05-09 DIAGNOSIS — A499 Bacterial infection, unspecified: Secondary | ICD-10-CM | POA: Insufficient documentation

## 2011-05-09 DIAGNOSIS — Z79899 Other long term (current) drug therapy: Secondary | ICD-10-CM | POA: Insufficient documentation

## 2011-05-09 DIAGNOSIS — R110 Nausea: Secondary | ICD-10-CM | POA: Insufficient documentation

## 2011-05-09 DIAGNOSIS — B9689 Other specified bacterial agents as the cause of diseases classified elsewhere: Secondary | ICD-10-CM | POA: Insufficient documentation

## 2011-05-09 DIAGNOSIS — N898 Other specified noninflammatory disorders of vagina: Secondary | ICD-10-CM | POA: Insufficient documentation

## 2011-05-09 DIAGNOSIS — R1032 Left lower quadrant pain: Secondary | ICD-10-CM

## 2011-05-09 DIAGNOSIS — R109 Unspecified abdominal pain: Secondary | ICD-10-CM | POA: Insufficient documentation

## 2011-05-09 DIAGNOSIS — R102 Pelvic and perineal pain: Secondary | ICD-10-CM

## 2011-05-09 LAB — URINE MICROSCOPIC-ADD ON

## 2011-05-09 LAB — URINALYSIS, ROUTINE W REFLEX MICROSCOPIC
Bilirubin Urine: NEGATIVE
Glucose, UA: NEGATIVE mg/dL
Hgb urine dipstick: NEGATIVE
Ketones, ur: NEGATIVE mg/dL
Nitrite: POSITIVE — AB
Protein, ur: NEGATIVE mg/dL
Specific Gravity, Urine: 1.005 (ref 1.005–1.030)
Urobilinogen, UA: 0.2 mg/dL (ref 0.0–1.0)
pH: 7 (ref 5.0–8.0)

## 2011-05-09 LAB — POCT URINALYSIS DIP (DEVICE)
Bilirubin Urine: NEGATIVE
Glucose, UA: NEGATIVE mg/dL
Hgb urine dipstick: NEGATIVE
Ketones, ur: NEGATIVE mg/dL
Nitrite: POSITIVE — AB
Protein, ur: NEGATIVE mg/dL
Specific Gravity, Urine: 1.01 (ref 1.005–1.030)
Urobilinogen, UA: 0.2 mg/dL (ref 0.0–1.0)
pH: 7 (ref 5.0–8.0)

## 2011-05-09 LAB — WET PREP, GENITAL
Trich, Wet Prep: NONE SEEN
Yeast Wet Prep HPF POC: NONE SEEN

## 2011-05-09 LAB — POCT PREGNANCY, URINE: Preg Test, Ur: NEGATIVE

## 2011-05-09 MED ORDER — CLINDAMYCIN HCL 150 MG PO CAPS: 150.0000 mg | ORAL_CAPSULE | Freq: Four times a day (QID) | ORAL | Status: AC

## 2011-05-09 MED ORDER — OXYCODONE-ACETAMINOPHEN 5-325 MG PO TABS: 1.0000 | ORAL_TABLET | Freq: Four times a day (QID) | ORAL | Status: DC | PRN

## 2011-05-09 MED ORDER — KETOROLAC TROMETHAMINE 30 MG/ML IJ SOLN
60.0000 mg | Freq: Once | INTRAMUSCULAR | Status: AC
  Administered 2011-05-09: 60 mg via INTRAMUSCULAR

## 2011-05-09 MED ORDER — LIDOCAINE HCL (PF) 1 % IJ SOLN
INTRAMUSCULAR | Status: AC
  Administered 2011-05-09: 5 mL
  Filled 2011-05-09: qty 5

## 2011-05-09 MED ORDER — IBUPROFEN 800 MG PO TABS
800.0000 mg | ORAL_TABLET | Freq: Once | ORAL | Status: AC
  Administered 2011-05-09: 800 mg via ORAL
  Filled 2011-05-09: qty 1

## 2011-05-09 MED ORDER — CEFTRIAXONE SODIUM 250 MG IJ SOLR
250.0000 mg | Freq: Once | INTRAMUSCULAR | Status: AC
  Administered 2011-05-09: 250 mg via INTRAMUSCULAR
  Filled 2011-05-09: qty 250

## 2011-05-09 MED ORDER — DOXYCYCLINE HYCLATE 100 MG PO TABS
100.0000 mg | ORAL_TABLET | Freq: Once | ORAL | Status: AC
  Administered 2011-05-09: 100 mg via ORAL
  Filled 2011-05-09: qty 1

## 2011-05-09 MED ORDER — SULFAMETHOXAZOLE-TRIMETHOPRIM 800-160 MG PO TABS: 1.0000 | ORAL_TABLET | Freq: Two times a day (BID) | ORAL | Status: AC

## 2011-05-09 MED ORDER — DOXYCYCLINE HYCLATE 100 MG PO CAPS: 100.0000 mg | ORAL_CAPSULE | Freq: Two times a day (BID) | ORAL | Status: AC

## 2011-05-09 MED ORDER — CIPROFLOXACIN HCL 500 MG PO TABS: 500.0000 mg | ORAL_TABLET | Freq: Two times a day (BID) | ORAL | Status: DC

## 2011-05-09 MED ORDER — KETOROLAC TROMETHAMINE 60 MG/2ML IM SOLN
INTRAMUSCULAR | Status: AC
  Filled 2011-05-09: qty 2

## 2011-05-09 MED ORDER — OXYCODONE-ACETAMINOPHEN 5-325 MG PO TABS
2.0000 | ORAL_TABLET | Freq: Once | ORAL | Status: AC
  Administered 2011-05-09: 2 via ORAL
  Filled 2011-05-09: qty 2

## 2011-05-09 MED ORDER — OXYCODONE-ACETAMINOPHEN 5-325 MG PO TABS: 1.0000 | ORAL_TABLET | Freq: Once | ORAL | Status: DC

## 2011-05-09 NOTE — ED Notes (Signed)
Patient is AOx4 and is comfortable with the discharge instructions.

## 2011-05-09 NOTE — ED Notes (Signed)
Patient transported to Ultrasound 

## 2011-05-09 NOTE — ED Notes (Signed)
Pt with c/o left sided abdominal pain radiates across abd onset 1030 this morning - loose stool x one today - denies nausea or vomiting - denies vaginal discharge or irritation

## 2011-05-09 NOTE — ED Provider Notes (Signed)
History     CSN: [id number]  Arrival date & time 05/09/11  1547   First MD Initiated Contact with Patient 05/09/11 1801      Chief Complaint  Patient presents with  . Abdominal Pain  . Chills    (Consider location/radiation/quality/duration/timing/severity/associated sxs/prior treatment) HPI Comments: Patient reports intermittent left lower quadrant pain that is sharp in nature.  Began earlier this morning and has been coming in waves, states overall it is actually getting better.  Has one episode of nonbloody diarrhea this morning. Denies fevers, N/V, urinary symptoms, abnormal vaginal discharge or bleeding. LMP 05/03/11 was on time and normal.   Pt has hx c-section and tubal ligation.  No other abdominal surgeries.    The history is provided by the patient.    History reviewed. No pertinent past medical history.  Past Surgical History  Procedure Date  . Cesarean section   . Tonsillectomy   . Tubal ligation   . Mouth surgery     History reviewed. No pertinent family history.  History  Substance Use Topics  . Smoking status: Current Everyday Smoker -- 1.0 packs/day    Types: Cigarettes  . Smokeless tobacco: Never Used  . Alcohol Use: No    OB History    Grav Para Term Preterm Abortions TAB SAB Ect Mult Living                  Review of Systems  Constitutional: Negative for fever.  Gastrointestinal: Negative for vomiting, constipation, blood in stool, anal bleeding and rectal pain.  Genitourinary: Negative for dysuria, hematuria, flank pain, vaginal bleeding and vaginal discharge.  All other systems reviewed and are negative.    Allergies  Gabapentin; Hydrocodone; Tramadol; and Metronidazole  Home Medications   Current Outpatient Rx  Name Route Sig Dispense Refill  . ALPRAZOLAM 0.5 MG PO TABS Oral Take 1 tablet (0.5 mg total) by mouth 2 (two) times daily. 60 tablet 2  . CAPSAICIN 0.025 % EX CREA Topical Apply 1 application topically once as needed. For  pain     . CARISOPRODOL 350 MG PO TABS Oral Take 1 tablet (350 mg total) by mouth 3 (three) times daily. 90 tablet 2  . IBUPROFEN 800 MG PO TABS Oral Take 800 mg by mouth 3 (three) times daily.       BP 109/79  Pulse 95  Temp(Src) 97.9 F (36.6 C) (Oral)  Resp 18  SpO2 100%  LMP 05/04/2011  Physical Exam  Nursing note and vitals reviewed. Constitutional: She is oriented to person, place, and time. She appears well-developed and well-nourished.  HENT:  Head: Normocephalic and atraumatic.  Neck: Neck supple.  Cardiovascular: Normal rate, regular rhythm and normal heart sounds.   Pulmonary/Chest: Breath sounds normal. No respiratory distress. She has no wheezes. She has no rales. She exhibits no tenderness.  Abdominal: Soft. Bowel sounds are normal. She exhibits no distension and no mass. There is generalized tenderness. There is CVA tenderness. There is no rebound and no guarding.       Diffuse abdominal pain, worst in LLQ.  Mild left CVA tenderness.    Genitourinary: Uterus is tender. Cervix exhibits no motion tenderness, no discharge and no friability. Left adnexum displays tenderness.       Thin watery discharge in vagina.    Neurological: She is alert and oriented to person, place, and time.    ED Course  Procedures (including critical care time)  Labs Reviewed  POCT URINALYSIS DIP (DEVICE) - Abnormal;  Notable for the following:    Nitrite POSITIVE (*)    Leukocytes, UA TRACE (*) Biochemical Testing Only. Please order routine urinalysis from main lab if confirmatory testing is needed.   All other components within normal limits  POCT PREGNANCY, URINE  POCT URINALYSIS DIPSTICK  POCT PREGNANCY, URINE  WET PREP, GENITAL  GC/CHLAMYDIA PROBE AMP, GENITAL   No results found.   1. Abdominal pain, LLQ       MDM  Patient with LLQ pain - sharp, intermittent that began this morning.  Patient with UTI and slight left CVA tenderness - this may be responsible for her pain.   However, on exam, patient has much more left lower quadrant/left adnexal pain than midline/suprapubic tenderness.  Pt does have hx ovarian cyst and ?"chronic left ovary pain" but patient states this feels worse than it ever has before.  Patient transferred to ED for further evaluation.          Dillard Cannon [place], [place] 05/09/11 1926

## 2011-05-09 NOTE — ED Notes (Signed)
Patient sent from [facility] for further evaluation of her abd. Pain.   [facility] performed a pelvic exam and patient area was very tender so was sent to ED for further evaluation

## 2011-05-09 NOTE — ED Notes (Signed)
MD at bedside. 

## 2011-05-09 NOTE — ED Provider Notes (Addendum)
History     CSN: [id number]  Arrival date & time 05/09/11  1954   First MD Initiated Contact with Patient 05/09/11 2133      Chief Complaint  Patient presents with  . Abdominal Pain    (Consider location/radiation/quality/duration/timing/severity/associated sxs/prior treatment) Patient is a 31 y.o. female presenting with abdominal pain. The history is provided by the patient. No language interpreter was used.  Abdominal Pain The primary symptoms of the illness include abdominal pain, nausea and vaginal bleeding. The primary symptoms of the illness do not include fever, fatigue, shortness of breath, vomiting, diarrhea, dysuria or vaginal discharge. The current episode started 13 to 24 hours ago. The onset of the illness was gradual. The problem has been gradually worsening.  The pain came on gradually. The abdominal pain has been gradually worsening since its onset. The abdominal pain is located in the LLQ and suprapubic region. The abdominal pain does not radiate. The abdominal pain is relieved by nothing. The abdominal pain is exacerbated by movement.  The nausea is associated with her menstrual cycle. The nausea is exacerbated by motion.  The quantity of blood was typical of menses.  The patient states that she believes she is currently not pregnant. The patient has not had a change in bowel habit. Symptoms associated with the illness do not include anorexia, constipation, urgency, hematuria, frequency or back pain.    History reviewed. No pertinent past medical history.  Past Surgical History  Procedure Date  . Cesarean section   . Tonsillectomy   . Tubal ligation   . Mouth surgery     No family history on file.  History  Substance Use Topics  . Smoking status: Current Everyday Smoker -- 1.0 packs/day    Types: Cigarettes  . Smokeless tobacco: Never Used  . Alcohol Use: No    OB History    Grav Para Term Preterm Abortions TAB SAB Ect Mult Living                   Review of Systems  Constitutional: Negative for fever, activity change, appetite change and fatigue.  HENT: Negative for congestion, sore throat, rhinorrhea, neck pain and neck stiffness.   Respiratory: Negative for cough and shortness of breath.   Cardiovascular: Negative for chest pain and palpitations.  Gastrointestinal: Positive for nausea and abdominal pain. Negative for vomiting, diarrhea, constipation, blood in stool and anorexia.  Genitourinary: Positive for vaginal bleeding and pelvic pain. Negative for dysuria, urgency, frequency, hematuria, flank pain, vaginal discharge and vaginal pain.  Musculoskeletal: Negative for myalgias, back pain and arthralgias.  Neurological: Negative for dizziness, weakness, light-headedness, numbness and headaches.  All other systems reviewed and are negative.    Allergies  Gabapentin; Hydrocodone; Tramadol; and Metronidazole  Home Medications   Current Outpatient Rx  Name Route Sig Dispense Refill  . ALPRAZOLAM 0.5 MG PO TABS Oral Take 1 tablet (0.5 mg total) by mouth 2 (two) times daily. 60 tablet 2  . CAPSAICIN 0.025 % EX CREA Topical Apply 1 application topically once as needed. For pain     . CARISOPRODOL 350 MG PO TABS Oral Take 1 tablet (350 mg total) by mouth 3 (three) times daily. 90 tablet 2  . IBUPROFEN 800 MG PO TABS Oral Take 800 mg by mouth 3 (three) times daily.     . SULFAMETHOXAZOLE-TRIMETHOPRIM 800-160 MG PO TABS Oral Take 1 tablet by mouth 2 (two) times daily. 6 tablet 0  . CLINDAMYCIN HCL 150 MG PO CAPS Oral  Take 1 capsule (150 mg total) by mouth every 6 (six) hours. 28 capsule 0  . DOXYCYCLINE HYCLATE 100 MG PO CAPS Oral Take 1 capsule (100 mg total) by mouth 2 (two) times daily. 28 capsule 0  . OXYCODONE-ACETAMINOPHEN 5-325 MG PO TABS Oral Take 1-2 tablets by mouth every 6 (six) hours as needed for pain. 15 tablet 0    BP 103/68  Pulse 72  Temp(Src) 98 F (36.7 C) (Oral)  Resp 15  Ht 5\' 4"  (1.626 m)  Wt 115 lb  (52.164 kg)  BMI 19.74 kg/m2  SpO2 100%  LMP 05/04/2011  Physical Exam  Nursing note and vitals reviewed. Constitutional: She is oriented to person, place, and time. She appears well-developed and well-nourished.       Uncomfortable in appearance  HENT:  Head: Normocephalic and atraumatic.  Mouth/Throat: Oropharynx is clear and moist.  Eyes: Conjunctivae and EOM are normal. Pupils are equal, round, and reactive to light.  Neck: Normal range of motion. Neck supple.  Cardiovascular: Normal rate, regular rhythm, normal heart sounds and intact distal pulses.  Exam reveals no gallop and no friction rub.   No murmur heard. Pulmonary/Chest: Effort normal and breath sounds normal. No respiratory distress.  Abdominal: Soft. Bowel sounds are normal. There is tenderness (LLQ and suprapubic tenderness). There is no rebound and no guarding.  Genitourinary:       Pelvic exam deferred at the patient request as had one performed at [facility] prior to arrival - reviewed  Musculoskeletal: Normal range of motion. She exhibits no tenderness.  Neurological: She is alert and oriented to person, place, and time.  Skin: Skin is warm and dry. No rash noted.    ED Course  Procedures (including critical care time)  Labs Reviewed  URINALYSIS, ROUTINE W REFLEX MICROSCOPIC - Abnormal; Notable for the following:    APPearance CLOUDY (*)    Nitrite POSITIVE (*)    Leukocytes, UA SMALL (*)    All other components within normal limits  URINE MICROSCOPIC-ADD ON - Abnormal; Notable for the following:    Squamous Epithelial / LPF FEW (*)    Bacteria, UA MANY (*)    All other components within normal limits   US Transvaginal Non-ob  05/09/2011  *RADIOLOGY REPORT*  Clinical Data:  Left lower quadrant pain.  Rule out torsion.  LMP 05/03/2011.  TRANSABDOMINAL AND TRANSVAGINAL ULTRASOUND OF PELVIS DOPPLER ULTRASOUND OF OVARIES  Technique:  Both transabdominal and transvaginal ultrasound examinations of the pelvis were  performed. Transabdominal technique was performed for global imaging of the pelvis including uterus, ovaries, adnexal regions, and pelvic cul-de-sac.  It was necessary to proceed with endovaginal exam following the transabdominal exam to visualize the uterus, endometrium, ovaries, and adnexa.  Color and duplex Doppler ultrasound was utilized to evaluate blood flow to the ovaries.  Comparison:  MRI pelvis 09/24/2010  Findings:  Uterus:  Normal in size and echotexture.  Measures 8.2 0.9 x 6.0 cm.  Anteverted.  No focal mass.  Endometrium:  Normal in thickness and appearance.  Measures 1 mm.  Right ovary: Normal appearance/no adnexal mass.  Measures 2.6 x 1.7 x 1.7 cm.  Contains a dominant follicle. Vascularflow is seen to the right ovary.  Left ovary:   Normal appearance/no adnexal mass.  Measures 2.9 x 1.8-0.4 cm.  Contains several follicles.  Vascular flow is seen to the left ovary  Pulsed Doppler evaluation demonstrates normal arterial and venous waveforms in both ovaries.  IMPRESSION: Normal exam.  No evidence of  pelvic mass or other significant abnormality.  No sonographic evidence for ovarian torsion.  Original Report Authenticated By: Britta Mccreedy, M.D.   US Pelvis Complete  05/09/2011  *RADIOLOGY REPORT*  Clinical Data:  Left lower quadrant pain.  Rule out torsion.  LMP 05/03/2011.  TRANSABDOMINAL AND TRANSVAGINAL ULTRASOUND OF PELVIS DOPPLER ULTRASOUND OF OVARIES  Technique:  Both transabdominal and transvaginal ultrasound examinations of the pelvis were performed. Transabdominal technique was performed for global imaging of the pelvis including uterus, ovaries, adnexal regions, and pelvic cul-de-sac.  It was necessary to proceed with endovaginal exam following the transabdominal exam to visualize the uterus, endometrium, ovaries, and adnexa.  Color and duplex Doppler ultrasound was utilized to evaluate blood flow to the ovaries.  Comparison:  MRI pelvis 09/24/2010  Findings:  Uterus:  Normal in size and  echotexture.  Measures 8.2 0.9 x 6.0 cm.  Anteverted.  No focal mass.  Endometrium:  Normal in thickness and appearance.  Measures 1 mm.  Right ovary: Normal appearance/no adnexal mass.  Measures 2.6 x 1.7 x 1.7 cm.  Contains a dominant follicle. Vascularflow is seen to the right ovary.  Left ovary:   Normal appearance/no adnexal mass.  Measures 2.9 x 1.8-0.4 cm.  Contains several follicles.  Vascular flow is seen to the left ovary  Pulsed Doppler evaluation demonstrates normal arterial and venous waveforms in both ovaries.  IMPRESSION: Normal exam.  No evidence of pelvic mass or other significant abnormality.  No sonographic evidence for ovarian torsion.  Original Report Authenticated By: Britta Mccreedy, M.D.   [place] Art/ven Flow Abd Pelv Doppler  05/09/2011  *RADIOLOGY REPORT*  Clinical Data:  Left lower quadrant pain.  Rule out torsion.  LMP 05/03/2011.  TRANSABDOMINAL AND TRANSVAGINAL ULTRASOUND OF PELVIS DOPPLER ULTRASOUND OF OVARIES  Technique:  Both transabdominal and transvaginal ultrasound examinations of the pelvis were performed. Transabdominal technique was performed for global imaging of the pelvis including uterus, ovaries, adnexal regions, and pelvic cul-de-sac.  It was necessary to proceed with endovaginal exam following the transabdominal exam to visualize the uterus, endometrium, ovaries, and adnexa.  Color and duplex Doppler ultrasound was utilized to evaluate blood flow to the ovaries.  Comparison:  MRI pelvis 09/24/2010  Findings:  Uterus:  Normal in size and echotexture.  Measures 8.2 0.9 x 6.0 cm.  Anteverted.  No focal mass.  Endometrium:  Normal in thickness and appearance.  Measures 1 mm.  Right ovary: Normal appearance/no adnexal mass.  Measures 2.6 x 1.7 x 1.7 cm.  Contains a dominant follicle. Vascularflow is seen to the right ovary.  Left ovary:   Normal appearance/no adnexal mass.  Measures 2.9 x 1.8-0.4 cm.  Contains several follicles.  Vascular flow is seen to the left ovary  Pulsed  Doppler evaluation demonstrates normal arterial and venous waveforms in both ovaries.  IMPRESSION: Normal exam.  No evidence of pelvic mass or other significant abnormality.  No sonographic evidence for ovarian torsion.  Original Report Authenticated By: Britta Mccreedy, M.D.     1. Pelvic pain in female   2. Bacterial vaginosis       MDM  Patient with normal pelvic ultrasound. Illnesses acute on chronic pain. She may have a history of endometriosis however she does not know. Urine with evidence of infection however not entirely convincing for urinary tract infection therefore I will treat her for PID. I did not repeat a pelvic examination per the patient's request. I do not feel a CT abdomen pelvis would be of utility as her pain  is much lower in origin. Her pain is improved after Percocet. She'll be discharged home with Percocet, doxycycline, clindamycin. She'll receive a dose of ceftriaxone as well as emergency department. I do not feel laboratory studies are necessary such as blood work.        Dayton Bailiff, MD 05/09/11 1610  Dayton Bailiff, MD 05/09/11 [phone]

## 2011-05-09 NOTE — ED Notes (Addendum)
Family member(s) in waiting room; sent one family member back to see patient with primary RN's approval.

## 2011-05-11 NOTE — ED Provider Notes (Signed)
Medical screening examination/treatment/procedure(s) were performed by non-physician practitioner and as supervising physician I was immediately available for consultation/collaboration.  Corrie Mckusick, MD 05/11/11 2218

## 2011-05-12 ENCOUNTER — Other Ambulatory Visit: Payer: Self-pay | Admitting: Family Medicine

## 2011-05-12 LAB — GC/CHLAMYDIA PROBE AMP, GENITAL
Chlamydia, DNA Probe: NEGATIVE
GC Probe Amp, Genital: NEGATIVE

## 2011-05-12 NOTE — ED Notes (Signed)
GC/Chlamydia neg., Wet prep: Many clue cells, many WBC's.  Pt. adequately treated with Clindamycin. Vassie Moselle 05/12/2011

## 2011-05-13 NOTE — Telephone Encounter (Signed)
Refill request

## 2011-05-19 ENCOUNTER — Ambulatory Visit: Payer: Medicaid Other | Admitting: Physical Medicine & Rehabilitation

## 2011-05-19 ENCOUNTER — Encounter: Payer: Self-pay | Admitting: Family Medicine

## 2011-05-19 ENCOUNTER — Other Ambulatory Visit: Payer: Self-pay | Admitting: Family Medicine

## 2011-06-08 ENCOUNTER — Ambulatory Visit (HOSPITAL_BASED_OUTPATIENT_CLINIC_OR_DEPARTMENT_OTHER): Payer: Medicaid Other | Admitting: Physical Medicine & Rehabilitation

## 2011-06-08 ENCOUNTER — Encounter: Payer: Medicaid Other | Attending: Physical Medicine & Rehabilitation

## 2011-06-08 DIAGNOSIS — Q762 Congenital spondylolisthesis: Secondary | ICD-10-CM | POA: Insufficient documentation

## 2011-06-08 DIAGNOSIS — M5020 Other cervical disc displacement, unspecified cervical region: Secondary | ICD-10-CM | POA: Insufficient documentation

## 2011-06-08 DIAGNOSIS — M4310 Spondylolisthesis, site unspecified: Secondary | ICD-10-CM

## 2011-06-08 NOTE — Assessment & Plan Note (Signed)
Kiara Murillo is a 31 year old female who I saw back in 2011.  She has a history of lumbosacral spondylolisthesis, L5-S1.  She has had repeat imaging studies in the last couple of months of both her cervical spine as well as her lumbar spine.  She has no progression of her spondylolisthesis, mild grade 1 slip of L5 on S1.  She does have bilateral pars defects and it is really difficult to say whether this is a congenital or a posttraumatic.  She has a history of horse back riding.  In terms of her neck, she had some small broad-based disk osteophyte complex at both C4- 5, C5-6, and shallow protrusion slightly greater at the right at C6-7, but no significant neural compression.  She is here primarily for her back.  I saw her and had her go through some outpatient therapy about 2 years ago.  She responded quite well to this and was lost to followup.  She states her back pain is 5/10 on average 7 currently.  She also describes her pain as sharp, burning, dull, stabbing, tingling, aching, but really has no tingling that goes into her hands or her feet.  She has some shoulder tingling per her report.  Her review of systems, weakness, numbness, tingling, sometimes trouble walking, spasms, depression, anxiety.  She cannot identify any discrete exacerbating factors as everything makes it worse including her kids, but improves with rest, heat, medication, and TENS.  Primary care physician is, Dr. Leveda Anna, from [facility].  SOCIAL HISTORY:  Single mom with kids, a pack per day smoker.  PHYSICAL EXAMINATION:  VITAL SIGNS:  Blood pressure 118/72, pulse 80, respirations 16, O2 sat 100% on room air, weight 113 pounds, height 5 foot 4 inches. GENERAL:  An anxious-appearing young female in no acute distress. Orientation x3.  Gait is normal. MUSCULOSKELETAL:  She has no evidence of toe drag or knee instability. Neck range of motion is good.  Lower extremity strength is normal in the hip flexors,  knee extensors, ankle dorsiflexors, great toe extensors. Sensation is normal in bilateral upper and lower extremities.  Her deep tendon reflexes are normal.  She has tenderness to palpation in lumbosacral junction, pain with extension greater than flexion with decreased extension and range of motion.  IMPRESSION:  Lumbosacral spondylolisthesis.  Her pain appears to be more facet mediated.  I will reiterate my recommendations for medial branch blocks.  The patient is hesitant to doing any types of injections.  She states she did not tolerate the myelogram.  She would like to try physical therapy first.  I did advise her that Medicaid now only covers 3 or 4 therapy visits, but I would be happy to send her to therapy once again and at least they can refresh her memory in terms of the lumbar stabilization exercises she is supposed to be doing on ongoing basis.  I will see her back in about 6 weeks followup on therapy and possibly do her medial branches.     Erick Colace, M.D. Electronically Signed    AEK/MedQ D:  06/08/2011 17:20:41  T:  06/08/2011 21:49:30  Job #:  [id number]  cc:   William A. Leveda Anna, M.D.  Cristi Loron, M.D. Fax: [phone]

## 2011-06-16 ENCOUNTER — Other Ambulatory Visit: Payer: Self-pay | Admitting: Family Medicine

## 2011-06-17 NOTE — Telephone Encounter (Signed)
Refill request

## 2011-06-18 ENCOUNTER — Other Ambulatory Visit: Payer: Self-pay | Admitting: Family Medicine

## 2011-06-19 NOTE — Telephone Encounter (Signed)
Refill request

## 2011-06-29 ENCOUNTER — Other Ambulatory Visit: Payer: Self-pay | Admitting: Family Medicine

## 2011-06-30 ENCOUNTER — Other Ambulatory Visit: Payer: Self-pay | Admitting: Family Medicine

## 2011-06-30 DIAGNOSIS — G894 Chronic pain syndrome: Secondary | ICD-10-CM

## 2011-06-30 NOTE — Telephone Encounter (Signed)
Received refill request from pharm re: soma.  Originally, tried to send the refill, it printed, which caused me to relook.  (The Rx has not been sent.) Called and LM that she should not be on both soma and flexeril.  Asked her to call back as to which Rx she prefers.

## 2011-06-30 NOTE — Telephone Encounter (Signed)
Refill request

## 2011-07-01 MED ORDER — CARISOPRODOL 350 MG PO TABS: 350.0000 mg | ORAL_TABLET | Freq: Three times a day (TID) | ORAL | Status: DC

## 2011-07-01 NOTE — Telephone Encounter (Signed)
Kiara Murillo,  Neither Huntley Dec or I are able to close this encounter and we are not sure why.  Desma Mcgregor

## 2011-07-01 NOTE — Assessment & Plan Note (Signed)
Verified that she is taking soma and not flexeril.  I called in refill

## 2011-07-01 NOTE — Telephone Encounter (Signed)
Huntley Dec, Apparently there is a letter that needs to be sent before this note can be closed.  Can you play around with it to see if you can close it.

## 2011-07-02 NOTE — Telephone Encounter (Signed)
Done and closed.

## 2011-07-08 ENCOUNTER — Telehealth: Payer: Self-pay | Admitting: Physical Medicine & Rehabilitation

## 2011-07-08 DIAGNOSIS — M4310 Spondylolisthesis, site unspecified: Secondary | ICD-10-CM

## 2011-07-08 NOTE — Telephone Encounter (Signed)
Needs new order.  Previous order expired and patient has called back.  Fax to [phone]

## 2011-07-08 NOTE — Telephone Encounter (Signed)
PT request? Please advise

## 2011-07-09 ENCOUNTER — Telehealth: Payer: Self-pay | Admitting: *Deleted

## 2011-07-09 NOTE — Telephone Encounter (Signed)
Spoke with Deanna Artis to OK PT orders.  Will send referral through Epic

## 2011-07-09 NOTE — Telephone Encounter (Signed)
OK to renew PT

## 2011-07-10 NOTE — Telephone Encounter (Signed)
An order by the physician needs to be placed in epic for the PT

## 2011-07-10 NOTE — Telephone Encounter (Signed)
Message sent to physician to place order in epic for PT

## 2011-07-15 ENCOUNTER — Ambulatory Visit: Payer: Medicaid Other

## 2011-07-20 ENCOUNTER — Ambulatory Visit: Payer: Medicaid Other | Admitting: Physical Medicine & Rehabilitation

## 2011-07-20 ENCOUNTER — Encounter: Payer: Medicaid Other | Attending: Physical Medicine & Rehabilitation

## 2011-07-20 DIAGNOSIS — Q762 Congenital spondylolisthesis: Secondary | ICD-10-CM | POA: Insufficient documentation

## 2011-07-20 DIAGNOSIS — M5020 Other cervical disc displacement, unspecified cervical region: Secondary | ICD-10-CM | POA: Insufficient documentation

## 2011-07-24 ENCOUNTER — Encounter: Payer: Self-pay | Admitting: Physical Medicine & Rehabilitation

## 2011-07-29 ENCOUNTER — Ambulatory Visit: Payer: Medicaid Other | Attending: Physical Medicine & Rehabilitation

## 2011-07-29 ENCOUNTER — Other Ambulatory Visit: Payer: Self-pay | Admitting: Family Medicine

## 2011-07-29 DIAGNOSIS — M545 Low back pain, unspecified: Secondary | ICD-10-CM | POA: Insufficient documentation

## 2011-07-29 DIAGNOSIS — R5381 Other malaise: Secondary | ICD-10-CM | POA: Insufficient documentation

## 2011-07-29 DIAGNOSIS — IMO0001 Reserved for inherently not codable concepts without codable children: Secondary | ICD-10-CM | POA: Insufficient documentation

## 2011-07-29 DIAGNOSIS — F411 Generalized anxiety disorder: Secondary | ICD-10-CM

## 2011-07-30 ENCOUNTER — Other Ambulatory Visit: Payer: Self-pay | Admitting: *Deleted

## 2011-07-30 ENCOUNTER — Encounter: Payer: Medicaid Other | Admitting: Physical Therapy

## 2011-07-30 NOTE — Assessment & Plan Note (Signed)
Refilled based on e request.  Rx was called in.

## 2011-07-30 NOTE — Telephone Encounter (Signed)
Message left on office voicemail from [facility] [employer].  Needs refill of ibuprofen 800mg  #100 TID.  Pharmacy has submitted refill request twice, but keeps receiving message that request has "failed."  Will route note to Dr. Leveda Anna.  Gaylene Brooks, RN

## 2011-07-31 ENCOUNTER — Other Ambulatory Visit: Payer: Self-pay | Admitting: Family Medicine

## 2011-07-31 MED ORDER — IBUPROFEN 800 MG PO TABS: 800.0000 mg | ORAL_TABLET | Freq: Three times a day (TID) | ORAL | Status: DC | PRN

## 2011-08-13 ENCOUNTER — Encounter: Payer: Medicaid Other | Admitting: Physical Therapy

## 2011-08-25 NOTE — Telephone Encounter (Signed)
Huntley Dec, I still get the message that there are pending communications open so this hasn't been closed in my queue.

## 2011-08-25 NOTE — Telephone Encounter (Signed)
Don't have access to close this

## 2011-08-30 ENCOUNTER — Emergency Department (HOSPITAL_COMMUNITY)
Admission: EM | Admit: 2011-08-30 | Discharge: 2011-08-30 | Disposition: A | Discharge status: Home / Self Care | Payer: Medicaid Other | Attending: Emergency Medicine | Admitting: Emergency Medicine

## 2011-08-30 ENCOUNTER — Encounter (HOSPITAL_COMMUNITY): Payer: Self-pay

## 2011-08-30 DIAGNOSIS — M542 Cervicalgia: Secondary | ICD-10-CM | POA: Insufficient documentation

## 2011-08-30 DIAGNOSIS — M436 Torticollis: Secondary | ICD-10-CM | POA: Insufficient documentation

## 2011-08-30 MED ORDER — DIPHENHYDRAMINE HCL 25 MG PO CAPS
25.0000 mg | ORAL_CAPSULE | Freq: Once | ORAL | Status: AC
  Administered 2011-08-30: 25 mg via ORAL
  Filled 2011-08-30: qty 1

## 2011-08-30 MED ORDER — DIAZEPAM 5 MG PO TABS: ORAL_TABLET | ORAL | Status: DC

## 2011-08-30 MED ORDER — ONDANSETRON HCL 4 MG PO TABS
4.0000 mg | ORAL_TABLET | Freq: Once | ORAL | Status: AC
  Administered 2011-08-30: 4 mg via ORAL
  Filled 2011-08-30: qty 1

## 2011-08-30 MED ORDER — IBUPROFEN 800 MG PO TABS
800.0000 mg | ORAL_TABLET | Freq: Once | ORAL | Status: AC
  Administered 2011-08-30: 800 mg via ORAL
  Filled 2011-08-30: qty 1

## 2011-08-30 MED ORDER — FENTANYL CITRATE 0.05 MG/ML IJ SOLN
50.0000 ug | Freq: Once | INTRAMUSCULAR | Status: AC
  Administered 2011-08-30: 50 ug via INTRAMUSCULAR
  Filled 2011-08-30: qty 2

## 2011-08-30 MED ORDER — DIAZEPAM 5 MG PO TABS
5.0000 mg | ORAL_TABLET | Freq: Once | ORAL | Status: AC
  Administered 2011-08-30: 5 mg via ORAL
  Filled 2011-08-30: qty 1

## 2011-08-30 MED ORDER — MELOXICAM 7.5 MG PO TABS: ORAL_TABLET | ORAL | Status: DC

## 2011-08-30 NOTE — ED Notes (Signed)
Complain of pain in neck. States she was playing with her kids last night and heard a pop

## 2011-08-30 NOTE — ED Notes (Signed)
Pt a/ox4. Resp even and unlabored. NAD at this time. D/C instructions reviewed with pt. Pt verbalized understanding. Pt ambulated to lobby with steady gate.  

## 2011-08-30 NOTE — Discharge Instructions (Signed)
Please use a heating pad to the neck and shoulders, or soak in a tub of warm water 2 times daily. Valium may cause drowsiness, use with caution.Torticollis, Acute You have suddenly (acutely) developed a twisted neck (torticollis). This is usually a self-limited condition. CAUSES  Acute torticollis may be caused by malposition, trauma or infection. Most commonly, acute torticollis is caused by sleeping in an awkward position. Torticollis may also be caused by the flexion, extension or twisting of the neck muscles beyond their normal position. Sometimes, the exact cause may not be known. SYMPTOMS  Usually, there is pain and limited movement of the neck. Your neck may twist to one side. DIAGNOSIS  The diagnosis is often made by physical examination. X-rays, CT scans or MRIs may be done if there is a history of trauma or concern of infection. TREATMENT  For a common, stiff neck that develops during sleep, treatment is focused on relaxing the contracted neck muscle. Medications (including shots) may be used to treat the problem. Most cases resolve in several days. Torticollis usually responds to conservative physical therapy. If left untreated, the shortened and spastic neck muscle can cause deformities in the face and neck. Rarely, surgery is required. HOME CARE INSTRUCTIONS   Use over-the-counter and prescription medications as directed by your caregiver.   Do stretching exercises and massage the neck as directed by your caregiver.   Follow up with physical therapy if needed and as directed by your caregiver.  SEEK IMMEDIATE MEDICAL CARE IF:   You develop difficulty breathing or noisy breathing (stridor).   You drool, develop trouble swallowing or have pain with swallowing.   You develop numbness or weakness in the hands or feet.   You have changes in speech or vision.   You have problems with urination or bowel movements.   You have difficulty walking.   You have a fever.   You have  increased pain.  MAKE SURE YOU:   Understand these instructions.   Will watch your condition.   Will get help right away if you are not doing well or get worse.  Document Released: 04/03/2000 Document Revised: 03/26/2011 Document Reviewed: 05/15/2009 [facility] Patient Information 2012 [place], [place].

## 2011-08-30 NOTE — ED Provider Notes (Signed)
History     CSN: [id number]  Arrival date & time 08/30/11  1354   First MD Initiated Contact with Patient 08/30/11 1457      Chief Complaint  Patient presents with  . Neck Pain    (Consider location/radiation/quality/duration/timing/severity/associated sxs/prior treatment) Patient is a 31 y.o. female presenting with neck pain. The history is provided by the patient.  Neck Pain  This is a new problem. The current episode started yesterday. The problem occurs constantly. The problem has been gradually worsening. The pain is associated with twisting. There has been no fever. The pain is present in the left side. The quality of the pain is described as aching. The pain radiates to the left shoulder. The pain is moderate. Exacerbated by: attempted movement. The pain is the same all the time. Stiffness is present all day. Pertinent negatives include no photophobia, no visual change, no chest pain, no syncope, no bowel incontinence and no bladder incontinence. She has tried analgesics for the symptoms. The treatment provided no relief.    Past Medical History  Diagnosis Date  . Anxiety   . Depression   . Headache hemiplegic migraine  . Chondromalacia of patella   . Lumbar spondylolysis   . Low back pain   . Chronic pelvic pain in female   . Chronic pain syndrome   . Spinal stenosis in cervical region   . Congenital spondylolisthesis   . Spinal stenosis in cervical region   . Congenital spondylolisthesis     Past Surgical History  Procedure Date  . Cesarean section   . Tonsillectomy   . Tubal ligation   . Mouth surgery     No family history on file.  History  Substance Use Topics  . Smoking status: Current Everyday Smoker -- 1.0 packs/day    Types: Cigarettes  . Smokeless tobacco: Never Used  . Alcohol Use: No    OB History    Grav Para Term Preterm Abortions TAB SAB Ect Mult Living                  Review of Systems  Constitutional: Negative for activity change.         All ROS Neg except as noted in HPI  HENT: Positive for neck pain. Negative for nosebleeds.   Eyes: Negative for photophobia and discharge.  Respiratory: Negative for cough, shortness of breath and wheezing.   Cardiovascular: Negative for chest pain, palpitations and syncope.  Gastrointestinal: Negative for abdominal pain, blood in stool and bowel incontinence.  Genitourinary: Negative for bladder incontinence, dysuria, frequency and hematuria.  Musculoskeletal: Positive for back pain and arthralgias.  Skin: Negative.   Neurological: Negative for dizziness, seizures and speech difficulty.  Psychiatric/Behavioral: Negative for hallucinations and confusion. The patient is nervous/anxious.     Allergies  Eggs or egg-derived products; Gabapentin; Hydrocodone; Tramadol; and Metronidazole  Home Medications   Current Outpatient Rx  Name Route Sig Dispense Refill  . ALPRAZOLAM 0.5 MG PO TABS  TAKE 1 TABLET BY MOUTH TWICE DAILY 60 tablet 5    Must last one month, no early refills.  . CAPSAICIN 0.025 % EX CREA Topical Apply 1 application topically once as needed. For pain     . CARISOPRODOL 350 MG PO TABS Oral Take 1 tablet (350 mg total) by mouth 3 (three) times daily. 90 tablet 5  . IBUPROFEN 800 MG PO TABS Oral Take 1 tablet (800 mg total) by mouth every 8 (eight) hours as needed for pain. 100 tablet  6    BP 133/89  Pulse 94  Temp(Src) 97.6 F (36.4 C) (Oral)  Resp 18  Ht 5\' 4"  (1.626 m)  Wt 120 lb (54.432 kg)  BMI 20.60 kg/m2  SpO2 100%  LMP 08/21/2011  Physical Exam  Nursing note and vitals reviewed. Constitutional: She is oriented to person, place, and time. She appears well-developed and well-nourished.  Non-toxic appearance.  HENT:  Head: Normocephalic.  Right Ear: Tympanic membrane and external ear normal.  Left Ear: Tympanic membrane and external ear normal.  Eyes: EOM and lids are normal. Pupils are equal, round, and reactive to light.  Neck: Trachea normal. Neck  supple. Carotid bruit is not present. Decreased range of motion present.  Cardiovascular: Normal rate, regular rhythm, normal heart sounds, intact distal pulses and normal pulses.   Pulmonary/Chest: Breath sounds normal. No respiratory distress.  Abdominal: Soft. Bowel sounds are normal. There is no tenderness. There is no guarding.  Musculoskeletal:       Pain and tightness of the left shoulder, extending to the left neck.  Lymphadenopathy:       Head (right side): No submandibular adenopathy present.       Head (left side): No submandibular adenopathy present.    She has no cervical adenopathy.  Neurological: She is alert and oriented to person, place, and time. She has normal strength. No cranial nerve deficit or sensory deficit.  Skin: Skin is warm and dry.  Psychiatric: She has a normal mood and affect. Her speech is normal.    ED Course  Procedures (including critical care time)  Labs Reviewed - No data to display No results found.   No diagnosis found.    MDM  **I have reviewed nursing notes, vital signs, and all appropriate lab and imaging results for this patient.* No gross neuro deficit noted. Rx for valium 5mg  and mobic 7.5 given to the patient.       Kathie Dike, [place] 08/31/11 2139

## 2011-09-01 NOTE — ED Provider Notes (Signed)
Medical screening examination/treatment/procedure(s) were performed by non-physician practitioner and as supervising physician I was immediately available for consultation/collaboration.   Ayauna Mcnay L Asuncion Shibata, MD 09/01/11 1537 

## 2011-09-04 ENCOUNTER — Encounter: Payer: Self-pay | Admitting: Family Medicine

## 2011-09-04 DIAGNOSIS — F323 Major depressive disorder, single episode, severe with psychotic features: Secondary | ICD-10-CM | POA: Insufficient documentation

## 2011-10-01 ENCOUNTER — Telehealth: Payer: Self-pay | Admitting: Family Medicine

## 2011-10-01 ENCOUNTER — Emergency Department (HOSPITAL_COMMUNITY)
Admission: EM | Admit: 2011-10-01 | Discharge: 2011-10-02 | Disposition: A | Discharge status: Home / Self Care | Payer: Medicaid Other | Attending: Emergency Medicine | Admitting: Emergency Medicine

## 2011-10-01 ENCOUNTER — Encounter (HOSPITAL_COMMUNITY): Payer: Self-pay | Admitting: Emergency Medicine

## 2011-10-01 ENCOUNTER — Emergency Department (HOSPITAL_COMMUNITY): Payer: Medicaid Other

## 2011-10-01 DIAGNOSIS — S139XXA Sprain of joints and ligaments of unspecified parts of neck, initial encounter: Secondary | ICD-10-CM | POA: Insufficient documentation

## 2011-10-01 DIAGNOSIS — G8929 Other chronic pain: Secondary | ICD-10-CM | POA: Insufficient documentation

## 2011-10-01 DIAGNOSIS — W19XXXA Unspecified fall, initial encounter: Secondary | ICD-10-CM

## 2011-10-01 DIAGNOSIS — F329 Major depressive disorder, single episode, unspecified: Secondary | ICD-10-CM | POA: Insufficient documentation

## 2011-10-01 DIAGNOSIS — M542 Cervicalgia: Secondary | ICD-10-CM | POA: Insufficient documentation

## 2011-10-01 DIAGNOSIS — W1789XA Other fall from one level to another, initial encounter: Secondary | ICD-10-CM | POA: Insufficient documentation

## 2011-10-01 DIAGNOSIS — M545 Low back pain, unspecified: Secondary | ICD-10-CM | POA: Insufficient documentation

## 2011-10-01 DIAGNOSIS — F17200 Nicotine dependence, unspecified, uncomplicated: Secondary | ICD-10-CM | POA: Insufficient documentation

## 2011-10-01 DIAGNOSIS — S161XXA Strain of muscle, fascia and tendon at neck level, initial encounter: Secondary | ICD-10-CM

## 2011-10-01 DIAGNOSIS — R51 Headache: Secondary | ICD-10-CM | POA: Insufficient documentation

## 2011-10-01 DIAGNOSIS — Q762 Congenital spondylolisthesis: Secondary | ICD-10-CM | POA: Insufficient documentation

## 2011-10-01 DIAGNOSIS — F3289 Other specified depressive episodes: Secondary | ICD-10-CM | POA: Insufficient documentation

## 2011-10-01 DIAGNOSIS — F411 Generalized anxiety disorder: Secondary | ICD-10-CM | POA: Insufficient documentation

## 2011-10-01 DIAGNOSIS — M4802 Spinal stenosis, cervical region: Secondary | ICD-10-CM | POA: Insufficient documentation

## 2011-10-01 NOTE — ED Notes (Signed)
PT. SLIPPED ON WET PORCH THIS EVENING , REPORTS PAIN AT BACK OF NECK RADIATING DOWN TO LOWER BACK . C- COLLAR APPLIED AT TRIAGE .

## 2011-10-01 NOTE — Telephone Encounter (Signed)
Patient is calling because she has hurt herself so bad, she can't move.  She is hoping for something to be called in for her to get through the weekend.

## 2011-10-01 NOTE — Telephone Encounter (Signed)
Called and informed pt that there is nothing that we can call in for her. I suggested that she take IBU 600 or 800 mg for pain and use either ice or moist heat pack. Pt understood.Loralee Pacas [place]

## 2011-10-02 ENCOUNTER — Emergency Department (HOSPITAL_COMMUNITY): Payer: Medicaid Other

## 2011-10-02 MED ORDER — OXYCODONE-ACETAMINOPHEN 5-325 MG PO TABS: 1.0000 | ORAL_TABLET | ORAL | Status: AC | PRN

## 2011-10-02 MED ORDER — OXYCODONE-ACETAMINOPHEN 5-325 MG PO TABS
2.0000 | ORAL_TABLET | Freq: Once | ORAL | Status: AC
  Administered 2011-10-02: 2 via ORAL
  Filled 2011-10-02: qty 2
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
## 2011-10-02 NOTE — Discharge Instructions (Signed)
You were seen and evaluated for your complaints of neck pain after your fall. Your x-rays CT scan have not shown any signs for concerning injury or fracture of your spine. At this time your providers feel your symptoms are caused from muscle and cervical strain. You have been given a soft collar to where to help brace your head and neck for comfort. Please followup with your primary care provider for continued evaluation and treatment. If you develop any worsening symptoms, increasing pain, weakness or numbness in your upper arms please return to the emergency room.    Cervical Sprain A cervical sprain is an injury in the neck in which the ligaments are stretched or torn. The ligaments are the tissues that hold the bones of the neck (vertebrae) in place.Cervical sprains can range from very mild to very severe. Most cervical sprains get better in 1 to 3 weeks, but it depends on the cause and extent of the injury. Severe cervical sprains can cause the neck vertebrae to be unstable. This can lead to damage of the spinal cord and can result in serious nervous system problems. Your caregiver will determine whether your cervical sprain is mild or severe. CAUSES  Severe cervical sprains may be caused by:  Contact sport injuries (football, rugby, wrestling, hockey, auto racing, gymnastics, diving, martial arts, boxing).   Motor vehicle collisions.   Whiplash injuries. This means the neck is forcefully whipped backward and forward.   Falls.  Mild cervical sprains may be caused by:   Awkward positions, such as cradling a telephone between your ear and shoulder.   Sitting in a chair that does not offer proper support.   Working at a poorly Marketing executive station.   Activities that require looking up or down for long periods of time.  SYMPTOMS   Pain, soreness, stiffness, or a burning sensation in the front, back, or sides of the neck. This discomfort may develop immediately after injury or it may  develop slowly and not begin for 24 hours or more after an injury.   Pain or tenderness directly in the middle of the back of the neck.   Shoulder or upper back pain.   Limited ability to move the neck.   Headache.   Dizziness.   Weakness, numbness, or tingling in the hands or arms.   Muscle spasms.   Difficulty swallowing or chewing.   Tenderness and swelling of the neck.  DIAGNOSIS  Most of the time, your caregiver can diagnose this problem by taking your history and doing a physical exam. Your caregiver will ask about any known problems, such as arthritis in the neck or a previous neck injury. X-rays may be taken to find out if there are any other problems, such as problems with the bones of the neck. However, an X-ray often does not reveal the full extent of a cervical sprain. Other tests such as a computed tomography (CT) scan or magnetic resonance imaging (MRI) may be needed. TREATMENT  Treatment depends on the severity of the cervical sprain. Mild sprains can be treated with rest, keeping the neck in place (immobilization), and pain medicines. Severe cervical sprains need immediate immobilization and an appointment with an orthopedist or neurosurgeon. Several treatment options are available to help with pain, muscle spasms, and other symptoms. Your caregiver may prescribe:  Medicines, such as pain relievers, numbing medicines, or muscle relaxants.   Physical therapy. This can include stretching exercises, strengthening exercises, and posture training. Exercises and improved posture can help  stabilize the neck, strengthen muscles, and help stop symptoms from returning.   A neck collar to be worn for short periods of time. Often, these collars are worn for comfort. However, certain collars may be worn to protect the neck and prevent further worsening of a serious cervical sprain.  HOME CARE INSTRUCTIONS   Put ice on the injured area.   Put ice in a plastic bag.   Place a towel  between your skin and the bag.   Leave the ice on for 15 to 20 minutes, 3 to 4 times a day.   Only take over-the-counter or prescription medicines for pain, discomfort, or fever as directed by your caregiver.   Keep all follow-up appointments as directed by your caregiver.   Keep all physical therapy appointments as directed by your caregiver.   If a neck collar is prescribed, wear it as directed by your caregiver.   Do not drive while wearing a neck collar.   Make any needed adjustments to your work station to promote good posture.   Avoid positions and activities that make your symptoms worse.   Warm up and stretch before being active to help prevent problems.  SEEK MEDICAL CARE IF:   Your pain is not controlled with medicine.   You are unable to decrease your pain medicine over time as planned.   Your activity level is not improving as expected.  SEEK IMMEDIATE MEDICAL CARE IF:   You develop any bleeding, stomach upset, or signs of an allergic reaction to your medicine.   Your symptoms get worse.   You develop new, unexplained symptoms.   You have numbness, tingling, weakness, or paralysis in any part of your body.  MAKE SURE YOU:   Understand these instructions.   Will watch your condition.   Will get help right away if you are not doing well or get worse.  Document Released: 02/01/2007 Document Revised: 03/26/2011 Document Reviewed: 01/07/2011 [facility] Patient Information 2012 [place], [place].      RESOURCE GUIDE  Chronic Pain Problems: Contact [facility]  [phone] Patients need to be referred by their primary care doctor.  Insufficient Money for Medicine: Contact United Way:  call "211" or [facility] [phone].  No Primary Care Doctor: - Call [facility]  [phone] - can help you locate a primary care doctor that  accepts your insurance, provides certain services, etc. - Physician Referral [phone]  Agencies that provide inexpensive medical care: - [facility]  [phone] - [facility]  [phone] - [facility]  [phone] [facility]  [phone] - [facility]  [phone] [facility]  [phone]  Medicaid-accepting [facility] Providers: - [facility]- [address]  [phone], Mon-Fri 9am-7pm, Sat 9am-1pm - [facility]- [address] [place], [address] [place]  [phone] - [facility]- [address] [place]  [phone] [facility]- [address]  [phone] - Renaye Rakers- [address] [place], [address], [phone]  Only accepts [place] patients after they have their name  applied to their card  Self Pay (no insurance) in [place]: - Sickle Cell Patients: Dr Willey Blade, [facility]  [address] [place], [phone] - [facility]- [address] [place]  [phone]       Patrcia Dolly [facility] [place]- [address]       -     [employer]- see information above (Speak to [employer] if you do not have insurance)       -  [facility]- [address] [place], [phone]       -  [facility]- [address],  [phone]       -  [facility]- [address] [phone]       -  Dr Julio Sicks-  [address], [place], [phone]       -  [facility]- [address] [phone]       -  [facility]- [address], [phone], also [address] [phone]       -    [facility]- [address] [place], [phone], 1st & 3rd Saturday   every month, 10am-1pm  1) Find a Doctor and Pay Out of Pocket Although you won't have to find out who is covered by your insurance plan, it is a good idea to ask around and get recommendations. You will then need to call the office and  see if the doctor you have chosen will accept you as a new patient and what types of options they offer for patients who are self-pay. Some doctors offer discounts or will set up payment plans for their patients who do not have insurance, but you will need to ask so you aren't surprised when you get to your appointment.  2) Contact Your Local Health Department Not all health departments have doctors that can see patients for sick visits, but many do, so it is worth a call to see if yours does. If you don't know where your local health department is, you can check in your phone book. The CDC also has a tool to help you locate your state's health department, and many state websites also have listings of all of their local health departments.  3) Find a Walk-in Clinic If your illness is not likely to be very severe or complicated, you may want to try a walk in clinic. These are popping up all over the country in pharmacies, drugstores, and shopping centers. They're usually staffed by nurse practitioners or physician assistants that have been trained to treat common illnesses and complaints. They're usually fairly quick and inexpensive. However, if you have serious medical issues or chronic medical problems, these are probably not your best option  STD Testing - [facility] Department of [facility], STD Clinic, [address] [place], phone [phone] or [phone].  Monday - Friday, call for an appointment. [facility] Department of [employer], STD Clinic, [address], [place], phone [phone] or [phone].  Monday - Friday, call for an appointment.  Abuse/Neglect: [facility] Child Abuse Hotline 260-325-9698 [facility] Child Abuse Hotline 959-266-5205 (After Hours)  Emergency Shelter:  [facility] (213)110-8346  Maternity Homes: - [facility] [place] [facility] 469-704-3371 - [facility] [phone]  MRSA Hotline #:   719-671-3843  [facility] Resources  [facility] [place]  [employer] [facility] Dept. [address]                 [address]         [address]                                               Cristobal Goldmann Phone:  [phone]                                  Phone:  [phone]                   Phone:  [phone]  [facility], [phone] - [facility] - [phone]       -     [facility] in [place], [address],                                  [phone], [facility] Child Abuse Hotline 979-693-2236 or 203-789-4983 (After Hours)   Behavioral Health Services  Substance Abuse Resources: - [facility] Drug [facility]  [phone] - [facility] [phone] - [facility] [phone] Floydene Flock [phone] - Residential & Outpatient Substance Abuse Program  [phone]  Psychological Services: [facility]  [phone] [facility] Services  [phone] - [facility], [phone] [place]. [address], [place], ACCESS LINE: 352-233-8413 or (432)[phone], [employer]za  Dental Assistance  If unable to pay or uninsured, contact:  [facility] or [facility]. to become qualified for the adult dental clinic.  Patients with Medicaid: [facility] [phone] [address], [phone] [address] [phone]  If unable to pay, or uninsured, contact [facility] [phone]) or [facility] Department [phone] in [place], [phone] in [facility]) to become qualified for the adult dental clinic  Other Low-Cost Community Dental Services: - [facility]- [address] [place], [place], [place], [address], [phone], [phone], 2nd and 4th  Thursday of the month at 6:30am.  10 clients each day by appointment, can sometimes see walk-in patients if someone does not show for an appointment. [facility]- [address] [place], [place], [address], [phone] - [facility]- [address], [place], [place], [address], [phone] - [facility]- [phone] [facility]- [phone] [facility] Department- [phone]

## 2011-10-02 NOTE — ED Notes (Signed)
Pt alert and oriented, with steady gait at time of discharge. Pt given discharge papers and papers explained. All questions answered and pt walked to discharge.  

## 2011-10-04 NOTE — ED Provider Notes (Signed)
Medical screening examination/treatment/procedure(s) were performed by non-physician practitioner and as supervising physician I was immediately available for consultation/collaboration.  Shalon Councilman, MD 10/04/11 1914 

## 2011-10-05 ENCOUNTER — Encounter: Payer: Medicaid Other | Attending: Physical Medicine & Rehabilitation

## 2011-10-05 ENCOUNTER — Ambulatory Visit: Payer: Medicaid Other | Admitting: Physical Medicine & Rehabilitation

## 2011-10-05 DIAGNOSIS — Q762 Congenital spondylolisthesis: Secondary | ICD-10-CM | POA: Insufficient documentation

## 2011-10-05 DIAGNOSIS — M5020 Other cervical disc displacement, unspecified cervical region: Secondary | ICD-10-CM | POA: Insufficient documentation

## 2011-11-14 ENCOUNTER — Encounter (HOSPITAL_BASED_OUTPATIENT_CLINIC_OR_DEPARTMENT_OTHER): Payer: Self-pay | Admitting: *Deleted

## 2011-11-14 ENCOUNTER — Emergency Department (HOSPITAL_BASED_OUTPATIENT_CLINIC_OR_DEPARTMENT_OTHER)
Admission: EM | Admit: 2011-11-14 | Discharge: 2011-11-14 | Disposition: A | Discharge status: Home / Self Care | Payer: Medicaid Other | Attending: Emergency Medicine | Admitting: Emergency Medicine

## 2011-11-14 DIAGNOSIS — F3289 Other specified depressive episodes: Secondary | ICD-10-CM | POA: Insufficient documentation

## 2011-11-14 DIAGNOSIS — Q762 Congenital spondylolisthesis: Secondary | ICD-10-CM | POA: Insufficient documentation

## 2011-11-14 DIAGNOSIS — F411 Generalized anxiety disorder: Secondary | ICD-10-CM | POA: Insufficient documentation

## 2011-11-14 DIAGNOSIS — K029 Dental caries, unspecified: Secondary | ICD-10-CM | POA: Insufficient documentation

## 2011-11-14 DIAGNOSIS — K0889 Other specified disorders of teeth and supporting structures: Secondary | ICD-10-CM

## 2011-11-14 DIAGNOSIS — F329 Major depressive disorder, single episode, unspecified: Secondary | ICD-10-CM | POA: Insufficient documentation

## 2011-11-14 DIAGNOSIS — K089 Disorder of teeth and supporting structures, unspecified: Secondary | ICD-10-CM | POA: Insufficient documentation

## 2011-11-14 MED ORDER — PENICILLIN V POTASSIUM 500 MG PO TABS: 500.0000 mg | ORAL_TABLET | Freq: Three times a day (TID) | ORAL | Status: AC

## 2011-11-14 MED ORDER — OXYCODONE-ACETAMINOPHEN 5-325 MG PO TABS: 1.0000 | ORAL_TABLET | Freq: Four times a day (QID) | ORAL | Status: AC | PRN

## 2011-11-14 NOTE — ED Provider Notes (Signed)
History     CSN: [id number]  Arrival date & time 11/14/11  2225   First MD Initiated Contact with Patient 11/14/11 2300      Chief Complaint  Patient presents with  . Dental Pain    (Consider location/radiation/quality/duration/timing/severity/associated sxs/prior treatment) Patient is a 31 y.o. female presenting with tooth pain. The history is provided by the patient.  Dental PainThe primary symptoms include mouth pain. The symptoms began yesterday. The symptoms are worsening. The symptoms are recurrent. The symptoms occur constantly.  Additional symptoms include: gum swelling and gum tenderness.    Past Medical History  Diagnosis Date  . Anxiety   . Depression   . Headache hemiplegic migraine  . Chondromalacia of patella   . Lumbar spondylolysis   . Low back pain   . Chronic pelvic pain in female   . Chronic pain syndrome   . Spinal stenosis in cervical region   . Congenital spondylolisthesis   . Spinal stenosis in cervical region   . Congenital spondylolisthesis     Past Surgical History  Procedure Date  . Cesarean section   . Tonsillectomy   . Tubal ligation   . Mouth surgery     History reviewed. No pertinent family history.  History  Substance Use Topics  . Smoking status: Current Everyday Smoker -- 1.0 packs/day    Types: Cigarettes  . Smokeless tobacco: Never Used  . Alcohol Use: No    OB History    Grav Para Term Preterm Abortions TAB SAB Ect Mult Living                  Review of Systems  All other systems reviewed and are negative.    Allergies  Eggs or egg-derived products; Gabapentin; Hydrocodone; Tramadol; and Metronidazole  Home Medications   Current Outpatient Rx  Name Route Sig Dispense Refill  . ALPRAZOLAM 0.5 MG PO TABS  TAKE 1 TABLET BY MOUTH TWICE DAILY 60 tablet 5    Must last one month, no early refills.  . IBUPROFEN 800 MG PO TABS Oral Take 1 tablet (800 mg total) by mouth every 8 (eight) hours as needed for pain. 100  tablet 6  . NAPROXEN 500 MG PO TABS Oral Take 500 mg by mouth 2 (two) times daily with a meal.    . CARISOPRODOL 350 MG PO TABS Oral Take 1 tablet (350 mg total) by mouth 3 (three) times daily. 90 tablet 5    BP 114/80  Pulse 96  Temp 97.6 F (36.4 C) (Oral)  Resp 18  Ht 5\' 4"  (1.626 m)  Wt 120 lb (54.432 kg)  BMI 20.60 kg/m2  SpO2 100%  LMP 11/07/2011  Physical Exam  Nursing note and vitals reviewed. Constitutional: She is oriented to person, place, and time. She appears well-developed and well-nourished. No distress.  HENT:  Head: Normocephalic and atraumatic.       The right upper first molar is severely decayed with surrounding erythema of the gum.  She is exquisitely ttp.  Neck: Normal range of motion. Neck supple.  Neurological: She is alert and oriented to person, place, and time.  Skin: Skin is warm and dry. She is not diaphoretic.    ED Course  Procedures (including critical care time)  Labs Reviewed - No data to display No results found.   No diagnosis found.    MDM  Will prescribe antibiotics and percocet (15).  She needs to see dentist next week.  Geoffery Lyons, MD 11/14/11 2306

## 2011-11-14 NOTE — ED Notes (Signed)
Pt states she has had dental pain since yesterday. Right side of face hurts up into nose.

## 2011-11-19 NOTE — Telephone Encounter (Signed)
closed

## 2011-11-27 NOTE — Telephone Encounter (Signed)
Neither Huntley Dec or I have been able to close this encounter.

## 2011-11-30 NOTE — Telephone Encounter (Addendum)
Unable to close encounter due to pending communications unsent.  Will contact [employer] for assistance.   Gaylene Brooks, RN

## 2011-12-09 ENCOUNTER — Encounter (HOSPITAL_BASED_OUTPATIENT_CLINIC_OR_DEPARTMENT_OTHER): Payer: Self-pay

## 2011-12-09 ENCOUNTER — Emergency Department (HOSPITAL_BASED_OUTPATIENT_CLINIC_OR_DEPARTMENT_OTHER)
Admission: EM | Admit: 2011-12-09 | Discharge: 2011-12-09 | Disposition: A | Discharge status: Home / Self Care | Payer: Medicaid Other | Attending: Emergency Medicine | Admitting: Emergency Medicine

## 2011-12-09 DIAGNOSIS — S61019A Laceration without foreign body of unspecified thumb without damage to nail, initial encounter: Secondary | ICD-10-CM

## 2011-12-09 DIAGNOSIS — F411 Generalized anxiety disorder: Secondary | ICD-10-CM | POA: Insufficient documentation

## 2011-12-09 DIAGNOSIS — F329 Major depressive disorder, single episode, unspecified: Secondary | ICD-10-CM | POA: Insufficient documentation

## 2011-12-09 DIAGNOSIS — Z885 Allergy status to narcotic agent status: Secondary | ICD-10-CM | POA: Insufficient documentation

## 2011-12-09 DIAGNOSIS — Z91018 Allergy to other foods: Secondary | ICD-10-CM | POA: Insufficient documentation

## 2011-12-09 DIAGNOSIS — S61209A Unspecified open wound of unspecified finger without damage to nail, initial encounter: Secondary | ICD-10-CM | POA: Insufficient documentation

## 2011-12-09 DIAGNOSIS — F3289 Other specified depressive episodes: Secondary | ICD-10-CM | POA: Insufficient documentation

## 2011-12-09 DIAGNOSIS — M545 Low back pain, unspecified: Secondary | ICD-10-CM | POA: Insufficient documentation

## 2011-12-09 DIAGNOSIS — Z888 Allergy status to other drugs, medicaments and biological substances status: Secondary | ICD-10-CM | POA: Insufficient documentation

## 2011-12-09 DIAGNOSIS — W268XXA Contact with other sharp object(s), not elsewhere classified, initial encounter: Secondary | ICD-10-CM | POA: Insufficient documentation

## 2011-12-09 DIAGNOSIS — F17200 Nicotine dependence, unspecified, uncomplicated: Secondary | ICD-10-CM | POA: Insufficient documentation

## 2011-12-09 MED ORDER — IBUPROFEN 800 MG PO TABS
800.0000 mg | ORAL_TABLET | Freq: Once | ORAL | Status: AC
  Administered 2011-12-09: 800 mg via ORAL

## 2011-12-09 MED ORDER — IBUPROFEN 800 MG PO TABS
ORAL_TABLET | ORAL | Status: AC
  Filled 2011-12-09: qty 1

## 2011-12-09 NOTE — ED Notes (Signed)
Cut right thumb on glass while washing dishes yesterday approx 7am

## 2011-12-09 NOTE — ED Provider Notes (Signed)
History     CSN: [id number]  Arrival date & time 12/09/11  1431   First MD Initiated Contact with Patient 12/09/11 1538      Chief Complaint  Patient presents with  . Extremity Laceration    (Consider location/radiation/quality/duration/timing/severity/associated sxs/prior treatment) Patient is a 31 y.o. female presenting with skin laceration. The history is provided by the patient.  Laceration  Incident onset: yesterday morning at 7AM. Pain location: right thumb. The laceration is 1 cm in size. Injury mechanism: cut on a broken dish. The pain is moderate. The pain has been constant since onset. She reports no foreign bodies present.    Past Medical History  Diagnosis Date  . Anxiety   . Depression   . Headache hemiplegic migraine  . Chondromalacia of patella   . Lumbar spondylolysis   . Low back pain   . Chronic pelvic pain in female   . Chronic pain syndrome   . Spinal stenosis in cervical region   . Congenital spondylolisthesis   . Spinal stenosis in cervical region   . Congenital spondylolisthesis     Past Surgical History  Procedure Date  . Cesarean section   . Tonsillectomy   . Tubal ligation   . Mouth surgery     No family history on file.  History  Substance Use Topics  . Smoking status: Current Everyday Smoker -- 1.0 packs/day    Types: Cigarettes  . Smokeless tobacco: Never Used  . Alcohol Use: No    OB History    Grav Para Term Preterm Abortions TAB SAB Ect Mult Living                  Review of Systems  All other systems reviewed and are negative.    Allergies  Eggs or egg-derived products; Gabapentin; Hydrocodone; Tramadol; and Metronidazole  Home Medications   Current Outpatient Rx  Name Route Sig Dispense Refill  . ALPRAZOLAM 0.5 MG PO TABS  TAKE 1 TABLET BY MOUTH TWICE DAILY 60 tablet 5    Must last one month, no early refills.  Marland Kitchen CARISOPRODOL 350 MG PO TABS Oral Take 1 tablet (350 mg total) by mouth 3 (three) times daily. 90  tablet 5  . IBUPROFEN 800 MG PO TABS Oral Take 800 mg by mouth every 8 (eight) hours as needed. For pain.    Marland Kitchen NAPROXEN 500 MG PO TABS Oral Take 500 mg by mouth 2 (two) times daily with a meal. For pain.      BP 118/77  Pulse 99  Temp 98.5 F (36.9 C) (Oral)  Resp 16  Ht 5\' 4"  (1.626 m)  Wt 120 lb (54.432 kg)  BMI 20.60 kg/m2  SpO2 100%  LMP 12/01/2011  Physical Exam  Nursing note and vitals reviewed. Constitutional: She is oriented to person, place, and time. She appears well-developed and well-nourished.  HENT:  Head: Normocephalic and atraumatic.  Neck: Normal range of motion.  Musculoskeletal:       There is a 1 cm laceration to the mcp joint of the right hand.  There is no tendon involvement.  Neurological: She is alert and oriented to person, place, and time.  Skin: Skin is warm.    ED Course  Procedures (including critical care time)  Labs Reviewed - No data to display No results found.   No diagnosis found.    MDM  Dermabond applied to maintain approximation as patient complains of it bleeding when she bends her thumb.  The laceration  is minimal and the patient is requesting pain medication which I do not feel is indicated.  She will be advised to take motrin or tylenol as needed.        Geoffery Lyons, MD 12/09/11 [phone]

## 2011-12-20 ENCOUNTER — Other Ambulatory Visit: Payer: Self-pay | Admitting: Family Medicine

## 2011-12-20 DIAGNOSIS — M545 Low back pain, unspecified: Secondary | ICD-10-CM

## 2011-12-20 DIAGNOSIS — M47817 Spondylosis without myelopathy or radiculopathy, lumbosacral region: Secondary | ICD-10-CM

## 2011-12-22 ENCOUNTER — Encounter: Payer: Self-pay | Admitting: Family Medicine

## 2011-12-22 NOTE — Telephone Encounter (Signed)
Rx called in 

## 2012-01-03 ENCOUNTER — Encounter (HOSPITAL_BASED_OUTPATIENT_CLINIC_OR_DEPARTMENT_OTHER): Payer: Self-pay | Admitting: *Deleted

## 2012-01-03 ENCOUNTER — Emergency Department (HOSPITAL_BASED_OUTPATIENT_CLINIC_OR_DEPARTMENT_OTHER)
Admission: EM | Admit: 2012-01-03 | Discharge: 2012-01-03 | Disposition: A | Discharge status: Home / Self Care | Payer: Medicaid Other | Attending: Emergency Medicine | Admitting: Emergency Medicine

## 2012-01-03 DIAGNOSIS — K0889 Other specified disorders of teeth and supporting structures: Secondary | ICD-10-CM

## 2012-01-03 DIAGNOSIS — S025XXA Fracture of tooth (traumatic), initial encounter for closed fracture: Secondary | ICD-10-CM | POA: Insufficient documentation

## 2012-01-03 DIAGNOSIS — M47812 Spondylosis without myelopathy or radiculopathy, cervical region: Secondary | ICD-10-CM | POA: Insufficient documentation

## 2012-01-03 DIAGNOSIS — F17200 Nicotine dependence, unspecified, uncomplicated: Secondary | ICD-10-CM | POA: Insufficient documentation

## 2012-01-03 DIAGNOSIS — IMO0002 Reserved for concepts with insufficient information to code with codable children: Secondary | ICD-10-CM | POA: Insufficient documentation

## 2012-01-03 DIAGNOSIS — G8929 Other chronic pain: Secondary | ICD-10-CM | POA: Insufficient documentation

## 2012-01-03 MED ORDER — CLINDAMYCIN HCL 150 MG PO CAPS: 150.0000 mg | ORAL_CAPSULE | Freq: Four times a day (QID) | ORAL | Status: AC

## 2012-01-03 MED ORDER — OXYCODONE-ACETAMINOPHEN 5-325 MG PO TABS: 2.0000 | ORAL_TABLET | ORAL | Status: AC | PRN
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
## 2012-01-03 NOTE — ED Notes (Signed)
Pt states she has had right side dental pain and facial swelling x 2 days

## 2012-01-04 NOTE — ED Provider Notes (Signed)
Medical screening examination/treatment/procedure(s) were performed by non-physician practitioner and as supervising physician I was immediately available for consultation/collaboration.  Marwan T Powers, MD 01/04/12 0050 

## 2012-01-05 ENCOUNTER — Other Ambulatory Visit: Payer: Self-pay | Admitting: Family Medicine

## 2012-01-05 DIAGNOSIS — F4310 Post-traumatic stress disorder, unspecified: Secondary | ICD-10-CM

## 2012-01-06 NOTE — Assessment & Plan Note (Signed)
rx called in

## 2012-01-17 ENCOUNTER — Encounter (HOSPITAL_COMMUNITY): Payer: Self-pay | Admitting: Emergency Medicine

## 2012-01-17 ENCOUNTER — Emergency Department (HOSPITAL_COMMUNITY): Payer: Medicaid Other

## 2012-01-17 ENCOUNTER — Emergency Department (HOSPITAL_COMMUNITY)
Admission: EM | Admit: 2012-01-17 | Discharge: 2012-01-17 | Disposition: A | Discharge status: Home / Self Care | Payer: Medicaid Other | Attending: Emergency Medicine | Admitting: Emergency Medicine

## 2012-01-17 DIAGNOSIS — F3289 Other specified depressive episodes: Secondary | ICD-10-CM | POA: Insufficient documentation

## 2012-01-17 DIAGNOSIS — Z888 Allergy status to other drugs, medicaments and biological substances status: Secondary | ICD-10-CM | POA: Insufficient documentation

## 2012-01-17 DIAGNOSIS — Z885 Allergy status to narcotic agent status: Secondary | ICD-10-CM | POA: Insufficient documentation

## 2012-01-17 DIAGNOSIS — Z91012 Allergy to eggs: Secondary | ICD-10-CM | POA: Insufficient documentation

## 2012-01-17 DIAGNOSIS — F411 Generalized anxiety disorder: Secondary | ICD-10-CM | POA: Insufficient documentation

## 2012-01-17 DIAGNOSIS — S20219A Contusion of unspecified front wall of thorax, initial encounter: Secondary | ICD-10-CM

## 2012-01-17 DIAGNOSIS — G8929 Other chronic pain: Secondary | ICD-10-CM | POA: Insufficient documentation

## 2012-01-17 DIAGNOSIS — M549 Dorsalgia, unspecified: Secondary | ICD-10-CM | POA: Insufficient documentation

## 2012-01-17 DIAGNOSIS — F329 Major depressive disorder, single episode, unspecified: Secondary | ICD-10-CM | POA: Insufficient documentation

## 2012-01-17 DIAGNOSIS — F17200 Nicotine dependence, unspecified, uncomplicated: Secondary | ICD-10-CM | POA: Insufficient documentation

## 2012-01-17 MED ORDER — OXYCODONE-ACETAMINOPHEN 5-325 MG PO TABS: 1.0000 | ORAL_TABLET | ORAL | Status: AC | PRN

## 2012-01-17 MED ORDER — OXYCODONE-ACETAMINOPHEN 5-325 MG PO TABS
1.0000 | ORAL_TABLET | Freq: Once | ORAL | Status: AC
  Administered 2012-01-17: 1 via ORAL
  Filled 2012-01-17: qty 1
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
## 2012-01-17 NOTE — ED Notes (Signed)
Patient with no complaints at this time. Respirations even and unlabored. Skin warm/dry. Discharge instructions reviewed with patient at this time. Patient given opportunity to voice concerns/ask questions. Patient discharged at this time and left Emergency Department with steady gait.   

## 2012-01-17 NOTE — ED Provider Notes (Signed)
Medical screening examination/treatment/procedure(s) were performed by non-physician practitioner and as supervising physician I was immediately available for consultation/collaboration.   Roxsana Riding L Presley Summerlin, MD 01/17/12 1729 

## 2012-01-18 ENCOUNTER — Other Ambulatory Visit: Payer: Self-pay | Admitting: Family Medicine

## 2012-01-18 DIAGNOSIS — F1110 Opioid abuse, uncomplicated: Secondary | ICD-10-CM

## 2012-01-19 DIAGNOSIS — F1110 Opioid abuse, uncomplicated: Secondary | ICD-10-CM | POA: Insufficient documentation

## 2012-01-19 NOTE — Telephone Encounter (Signed)
As per previous refill note, will deny until seen.  Since I have discontinued all narcotic prescribing through this office, she has made multiple trips to the ER for pain related problems, getting narcotics at most visits.

## 2012-01-24 ENCOUNTER — Encounter (HOSPITAL_BASED_OUTPATIENT_CLINIC_OR_DEPARTMENT_OTHER): Payer: Self-pay | Admitting: *Deleted

## 2012-01-24 ENCOUNTER — Emergency Department (HOSPITAL_BASED_OUTPATIENT_CLINIC_OR_DEPARTMENT_OTHER): Payer: Medicaid Other

## 2012-01-24 ENCOUNTER — Emergency Department (HOSPITAL_BASED_OUTPATIENT_CLINIC_OR_DEPARTMENT_OTHER)
Admission: EM | Admit: 2012-01-24 | Discharge: 2012-01-25 | Disposition: A | Discharge status: Home / Self Care | Payer: Medicaid Other | Attending: Emergency Medicine | Admitting: Emergency Medicine

## 2012-01-24 DIAGNOSIS — Y998 Other external cause status: Secondary | ICD-10-CM | POA: Insufficient documentation

## 2012-01-24 DIAGNOSIS — M47817 Spondylosis without myelopathy or radiculopathy, lumbosacral region: Secondary | ICD-10-CM | POA: Insufficient documentation

## 2012-01-24 DIAGNOSIS — F17200 Nicotine dependence, unspecified, uncomplicated: Secondary | ICD-10-CM | POA: Insufficient documentation

## 2012-01-24 DIAGNOSIS — Y9389 Activity, other specified: Secondary | ICD-10-CM | POA: Insufficient documentation

## 2012-01-24 DIAGNOSIS — S2239XA Fracture of one rib, unspecified side, initial encounter for closed fracture: Secondary | ICD-10-CM | POA: Insufficient documentation

## 2012-01-24 DIAGNOSIS — G894 Chronic pain syndrome: Secondary | ICD-10-CM | POA: Insufficient documentation

## 2012-01-24 DIAGNOSIS — X58XXXA Exposure to other specified factors, initial encounter: Secondary | ICD-10-CM | POA: Insufficient documentation

## 2012-01-24 LAB — PREGNANCY, URINE: Preg Test, Ur: NEGATIVE

## 2012-01-24 MED ORDER — KETOROLAC TROMETHAMINE 60 MG/2ML IM SOLN
60.0000 mg | Freq: Once | INTRAMUSCULAR | Status: DC
  Filled 2012-01-24: qty 2

## 2012-01-24 MED ORDER — OXYCODONE-ACETAMINOPHEN 5-325 MG PO TABS
1.0000 | ORAL_TABLET | Freq: Once | ORAL | Status: AC
  Administered 2012-01-24: 1 via ORAL
  Filled 2012-01-24: qty 1

## 2012-01-24 MED ORDER — OXYCODONE-ACETAMINOPHEN 5-325 MG PO TABS
ORAL_TABLET | ORAL | Status: AC
  Administered 2012-01-24: 1 via ORAL
  Filled 2012-01-24: qty 1
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
## 2012-01-25 MED ORDER — OXYCODONE-ACETAMINOPHEN 5-325 MG PO TABS: 1.0000 | ORAL_TABLET | Freq: Four times a day (QID) | ORAL | Status: DC | PRN

## 2012-01-25 NOTE — Patient Instructions (Signed)
Instructed pt on the proper use of IS. Pt able to accomplish 2250 x 5.

## 2012-01-28 ENCOUNTER — Telehealth: Payer: Self-pay | Admitting: Family Medicine

## 2012-01-28 NOTE — Telephone Encounter (Signed)
Patient is calling because she is in so much pain from cracked ribs and is hoping for something for pain to last her until her appt tomorrow.

## 2012-01-29 ENCOUNTER — Encounter: Payer: Self-pay | Admitting: Family Medicine

## 2012-01-29 ENCOUNTER — Ambulatory Visit (INDEPENDENT_AMBULATORY_CARE_PROVIDER_SITE_OTHER): Payer: Medicaid Other | Admitting: Family Medicine

## 2012-01-29 ENCOUNTER — Telehealth: Payer: Self-pay | Admitting: Family Medicine

## 2012-01-29 VITALS — BP 123/78 | HR 95 | Temp 98.6°F | Ht 64.0 in | Wt 117.3 lb

## 2012-01-29 DIAGNOSIS — M47817 Spondylosis without myelopathy or radiculopathy, lumbosacral region: Secondary | ICD-10-CM

## 2012-01-29 DIAGNOSIS — Z23 Encounter for immunization: Secondary | ICD-10-CM

## 2012-01-29 DIAGNOSIS — F323 Major depressive disorder, single episode, severe with psychotic features: Secondary | ICD-10-CM

## 2012-01-29 DIAGNOSIS — S2239XA Fracture of one rib, unspecified side, initial encounter for closed fracture: Secondary | ICD-10-CM

## 2012-01-29 DIAGNOSIS — M545 Low back pain, unspecified: Secondary | ICD-10-CM

## 2012-01-29 MED ORDER — CARISOPRODOL 350 MG PO TABS: 350.0000 mg | ORAL_TABLET | Freq: Three times a day (TID) | ORAL | Status: DC | PRN

## 2012-01-29 NOTE — Telephone Encounter (Signed)
Patient is calling because she is having to move this weekend and she can hardly move with the pain.  She is hoping for any kind of pain med that Dr. Leveda Anna would prescribe.

## 2012-01-29 NOTE — Telephone Encounter (Signed)
Patient in for visit with Dr. Leveda Anna

## 2012-01-29 NOTE — Telephone Encounter (Signed)
Forwarded to pcp for advice.Kiara Murillo  

## 2012-01-29 NOTE — Patient Instructions (Addendum)
You got a flu and tetanus shot today. You need counseling for your depressing.  Lets get to the root of the problem rather than just covering up the symptoms.

## 2012-02-01 ENCOUNTER — Emergency Department (HOSPITAL_BASED_OUTPATIENT_CLINIC_OR_DEPARTMENT_OTHER): Payer: Medicaid Other

## 2012-02-01 ENCOUNTER — Emergency Department (HOSPITAL_BASED_OUTPATIENT_CLINIC_OR_DEPARTMENT_OTHER)
Admission: EM | Admit: 2012-02-01 | Discharge: 2012-02-01 | Disposition: A | Discharge status: Home / Self Care | Payer: Medicaid Other | Attending: Emergency Medicine | Admitting: Emergency Medicine

## 2012-02-01 ENCOUNTER — Encounter (HOSPITAL_BASED_OUTPATIENT_CLINIC_OR_DEPARTMENT_OTHER): Payer: Self-pay | Admitting: Emergency Medicine

## 2012-02-01 DIAGNOSIS — S2239XA Fracture of one rib, unspecified side, initial encounter for closed fracture: Secondary | ICD-10-CM | POA: Insufficient documentation

## 2012-02-01 DIAGNOSIS — R071 Chest pain on breathing: Secondary | ICD-10-CM | POA: Insufficient documentation

## 2012-02-01 DIAGNOSIS — F411 Generalized anxiety disorder: Secondary | ICD-10-CM | POA: Insufficient documentation

## 2012-02-01 DIAGNOSIS — F17200 Nicotine dependence, unspecified, uncomplicated: Secondary | ICD-10-CM | POA: Insufficient documentation

## 2012-02-01 DIAGNOSIS — R0789 Other chest pain: Secondary | ICD-10-CM

## 2012-02-01 DIAGNOSIS — G894 Chronic pain syndrome: Secondary | ICD-10-CM | POA: Insufficient documentation

## 2012-02-01 DIAGNOSIS — M47814 Spondylosis without myelopathy or radiculopathy, thoracic region: Secondary | ICD-10-CM | POA: Insufficient documentation

## 2012-02-01 DIAGNOSIS — S2231XA Fracture of one rib, right side, initial encounter for closed fracture: Secondary | ICD-10-CM

## 2012-02-01 MED ORDER — OXYCODONE-ACETAMINOPHEN 5-325 MG PO TABS
1.0000 | ORAL_TABLET | Freq: Once | ORAL | Status: AC
  Administered 2012-02-01: 1 via ORAL
  Filled 2012-02-01 (×2): qty 1

## 2012-02-01 NOTE — ED Notes (Signed)
Reports broken rib 2 1/2 weeks ago and dx here.  Reports sneezing tonight and feeling a sudden pain.

## 2012-02-01 NOTE — Telephone Encounter (Signed)
Patient given msg regarding regarding pain med.

## 2012-02-01 NOTE — ED Provider Notes (Signed)
History     CSN: [id number]  Arrival date & time 02/01/12  2031   First MD Initiated Contact with Patient 02/01/12 2050      Chief Complaint  Patient presents with  . Rib Injury    (Consider location/radiation/quality/duration/timing/severity/associated sxs/prior treatment) Patient is a 31 y.o. female presenting with chest pain. The history is provided by the patient.  Chest Pain The chest pain began 1 - 2 hours ago. Pertinent negatives for primary symptoms include no fever, no shortness of breath, no cough and no abdominal pain. Associated symptoms comments: She reports being diagnosed with rib fracture 2 weeks ago. Tonight she sneezed and caused recurrence of severe pain in the same location. No other injury..     Past Medical History  Diagnosis Date  . Anxiety   . Depression   . Headache hemiplegic migraine  . Chondromalacia of patella   . Lumbar spondylolysis   . Low back pain   . Chronic pelvic pain in female   . Chronic pain syndrome   . Spinal stenosis in cervical region   . Congenital spondylolisthesis   . Spinal stenosis in cervical region   . Congenital spondylolisthesis     Past Surgical History  Procedure Date  . Cesarean section   . Tonsillectomy   . Tubal ligation   . Mouth surgery     No family history on file.  History  Substance Use Topics  . Smoking status: Current Every Day Smoker -- 1.0 packs/day    Types: Cigarettes  . Smokeless tobacco: Never Used  . Alcohol Use: No    OB History    Grav Para Term Preterm Abortions TAB SAB Ect Mult Living                  Review of Systems  Constitutional: Negative for fever.  Respiratory: Negative for cough and shortness of breath.   Cardiovascular: Positive for chest pain.  Gastrointestinal: Negative for abdominal pain.  Musculoskeletal: Negative for back pain.    Allergies  Eggs or egg-derived products; Gabapentin; Hydrocodone; Tramadol; and Metronidazole  Home Medications   Current  Outpatient Rx  Name Route Sig Dispense Refill  . ALPRAZOLAM 0.5 MG PO TABS  TAKE 1 TABLET BY MOUTH TWICE DAILY 60 tablet 5  . CARISOPRODOL 350 MG PO TABS Oral Take 1 tablet (350 mg total) by mouth 3 (three) times daily as needed for muscle spasms. 90 tablet 5    Needs office visit for additional refills.  . IBUPROFEN 800 MG PO TABS Oral Take 800 mg by mouth every 8 (eight) hours as needed. For pain.      BP 128/74  Pulse 98  Temp 98 F (36.7 C) (Oral)  Resp 16  Ht 5\' 4"  (1.626 m)  Wt 115 lb (52.164 kg)  BMI 19.74 kg/m2  SpO2 100%  LMP 01/17/2012  Physical Exam  Constitutional: She is oriented to person, place, and time. She appears well-developed and well-nourished.  Neck: Normal range of motion.  Cardiovascular: Normal rate.   No murmur heard. Pulmonary/Chest: Effort normal and breath sounds normal. She has no wheezes. She has no rales.       Right lateral chest wall tenderness. No swelling or bruising.   Abdominal: There is no tenderness.  Neurological: She is alert and oriented to person, place, and time.  Skin: Skin is warm and dry.    ED Course  Procedures (including critical care time)  Labs Reviewed - No data to display Dg Ribs  Unilateral W/chest Right  02/01/2012  *RADIOLOGY REPORT*  Clinical Data: Right rib fracture, shortness of breath, pain  RIGHT RIBS AND CHEST - 3+ VIEW  Comparison: 01/24/2012  Findings: Redemonstration of an acute right lateral ninth rib fracture.  No pneumothorax or effusion.  Lungs remain clear.  Lower chest nipple shadows noted.  Normal heart size and vascularity. Stable appearance.  IMPRESSION: Nondisplaced fracture right lateral ninth rib.  No effusion or pneumothorax  Lungs remain clear   Original Report Authenticated By: Judie Petit. Ruel Favors, M.D.      No diagnosis found. 1. Chest wall pain   MDM  Patient's [place] Controlled Substance reports reviewed. She gets regularly dosed Soma and Xanax from Dr. Leveda Anna and multiple other narcotic  prescriptions from multiple sources. Unchanged chest x-ray tonight. Will encourage follow up with her primary care doctor, dr. Leveda Anna.        Rodena Medin, PA-C 02/01/12 2215  Rodena Medin, PA-C 02/01/12 2216
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
## 2012-02-01 NOTE — Assessment & Plan Note (Signed)
Referred to [facility] for medications and counseling.

## 2012-02-01 NOTE — ED Provider Notes (Signed)
Medical screening examination/treatment/procedure(s) were performed by non-physician practitioner and as supervising physician I was immediately available for consultation/collaboration.  Geoffery Lyons, MD 02/01/12 2248

## 2012-02-01 NOTE — Telephone Encounter (Signed)
Called pt to inform her that Dr. Leveda Anna suggested that she can take IBU 800mg  per dose 3-4 times a day. Could not leave VM due to her mailbox being full. I will try again later.Loralee Pacas [place]

## 2012-02-01 NOTE — Telephone Encounter (Signed)
No narcotic pain meds - this was discussed during office visit.  Advise pain ibuprofen 800mg  per dose three or four times per day.  (800 mg x 4 doses is the MAX daily dose.)  Please inform patient.
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
## 2012-04-04 ENCOUNTER — Encounter (HOSPITAL_BASED_OUTPATIENT_CLINIC_OR_DEPARTMENT_OTHER): Payer: Self-pay | Admitting: *Deleted

## 2012-04-04 ENCOUNTER — Emergency Department (HOSPITAL_BASED_OUTPATIENT_CLINIC_OR_DEPARTMENT_OTHER): Payer: Medicaid Other

## 2012-04-04 ENCOUNTER — Emergency Department (HOSPITAL_BASED_OUTPATIENT_CLINIC_OR_DEPARTMENT_OTHER)
Admission: EM | Admit: 2012-04-04 | Discharge: 2012-04-04 | Disposition: A | Discharge status: Home / Self Care | Payer: Medicaid Other | Attending: Emergency Medicine | Admitting: Emergency Medicine

## 2012-04-04 DIAGNOSIS — Y929 Unspecified place or not applicable: Secondary | ICD-10-CM | POA: Insufficient documentation

## 2012-04-04 DIAGNOSIS — Y9389 Activity, other specified: Secondary | ICD-10-CM | POA: Insufficient documentation

## 2012-04-04 DIAGNOSIS — Z87448 Personal history of other diseases of urinary system: Secondary | ICD-10-CM | POA: Insufficient documentation

## 2012-04-04 DIAGNOSIS — Z8679 Personal history of other diseases of the circulatory system: Secondary | ICD-10-CM | POA: Insufficient documentation

## 2012-04-04 DIAGNOSIS — F3289 Other specified depressive episodes: Secondary | ICD-10-CM | POA: Insufficient documentation

## 2012-04-04 DIAGNOSIS — S6000XA Contusion of unspecified finger without damage to nail, initial encounter: Secondary | ICD-10-CM

## 2012-04-04 DIAGNOSIS — F411 Generalized anxiety disorder: Secondary | ICD-10-CM | POA: Insufficient documentation

## 2012-04-04 DIAGNOSIS — R296 Repeated falls: Secondary | ICD-10-CM | POA: Insufficient documentation

## 2012-04-04 DIAGNOSIS — F329 Major depressive disorder, single episode, unspecified: Secondary | ICD-10-CM | POA: Insufficient documentation

## 2012-04-04 DIAGNOSIS — Z8739 Personal history of other diseases of the musculoskeletal system and connective tissue: Secondary | ICD-10-CM | POA: Insufficient documentation

## 2012-04-04 DIAGNOSIS — F17200 Nicotine dependence, unspecified, uncomplicated: Secondary | ICD-10-CM | POA: Insufficient documentation

## 2012-04-04 DIAGNOSIS — G8929 Other chronic pain: Secondary | ICD-10-CM | POA: Insufficient documentation

## 2012-04-04 MED ORDER — OXYCODONE-ACETAMINOPHEN 5-325 MG PO TABS
2.0000 | ORAL_TABLET | Freq: Once | ORAL | Status: DC
  Filled 2012-04-04: qty 2

## 2012-04-04 MED ORDER — IBUPROFEN 800 MG PO TABS: 800.0000 mg | ORAL_TABLET | Freq: Three times a day (TID) | ORAL | Status: DC | PRN

## 2012-04-04 MED ORDER — IBUPROFEN 800 MG PO TABS
800.0000 mg | ORAL_TABLET | Freq: Once | ORAL | Status: AC
  Administered 2012-04-04: 800 mg via ORAL
  Filled 2012-04-04: qty 1
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
## 2012-04-04 NOTE — ED Notes (Signed)
Pt c/o lower back pain and right ring finger injury x 1 hr ago.

## 2012-04-06 NOTE — ED Provider Notes (Signed)
Medical screening examination/treatment/procedure(s) were performed by non-physician practitioner and as supervising physician I was immediately available for consultation/collaboration.   Shauntell Iglesia Y. Valeria Krisko, MD 04/06/12 2103 

## 2012-06-10 ENCOUNTER — Other Ambulatory Visit: Payer: Self-pay | Admitting: Family Medicine

## 2012-06-13 NOTE — Telephone Encounter (Signed)
Refilled via fax request. 

## 2012-07-01 ENCOUNTER — Other Ambulatory Visit: Payer: Self-pay | Admitting: Family Medicine

## 2012-07-04 ENCOUNTER — Other Ambulatory Visit: Payer: Self-pay | Admitting: *Deleted

## 2012-07-04 DIAGNOSIS — M47817 Spondylosis without myelopathy or radiculopathy, lumbosacral region: Secondary | ICD-10-CM

## 2012-07-04 DIAGNOSIS — M545 Low back pain, unspecified: Secondary | ICD-10-CM

## 2012-07-04 MED ORDER — CARISOPRODOL 350 MG PO TABS: 350.0000 mg | ORAL_TABLET | Freq: Three times a day (TID) | ORAL | Status: DC | PRN

## 2012-08-07 ENCOUNTER — Other Ambulatory Visit: Payer: Self-pay | Admitting: Family Medicine

## 2012-08-08 ENCOUNTER — Other Ambulatory Visit: Payer: Self-pay | Admitting: *Deleted

## 2012-08-08 MED ORDER — IBUPROFEN 800 MG PO TABS: 800.0000 mg | ORAL_TABLET | Freq: Three times a day (TID) | ORAL | Status: DC | PRN

## 2012-09-27 ENCOUNTER — Other Ambulatory Visit: Payer: Self-pay | Admitting: *Deleted

## 2012-09-27 ENCOUNTER — Other Ambulatory Visit: Payer: Self-pay | Admitting: Family Medicine

## 2012-09-27 DIAGNOSIS — F323 Major depressive disorder, single episode, severe with psychotic features: Secondary | ICD-10-CM

## 2012-09-27 NOTE — Telephone Encounter (Signed)
Requested Prescriptions   Pending Prescriptions Disp Refills  . ALPRAZolam (XANAX) 0.5 MG tablet 60 tablet 2   Wyatt Haste, RN-BSN

## 2012-09-28 MED ORDER — ALPRAZOLAM 0.5 MG PO TABS: ORAL_TABLET | ORAL | Status: DC

## 2012-09-28 NOTE — Assessment & Plan Note (Signed)
Refilled med but needs appointment

## 2012-10-26 ENCOUNTER — Other Ambulatory Visit: Payer: Self-pay | Admitting: Family Medicine

## 2012-10-26 DIAGNOSIS — F323 Major depressive disorder, single episode, severe with psychotic features: Secondary | ICD-10-CM

## 2012-10-27 NOTE — Assessment & Plan Note (Signed)
The intent is to get patient into counseling, not simply prescribe benzos.  Will refill for one month and insist patient be seen by me for any additional refills.

## 2012-11-27 ENCOUNTER — Other Ambulatory Visit: Payer: Self-pay | Admitting: Family Medicine

## 2012-12-02 ENCOUNTER — Ambulatory Visit (INDEPENDENT_AMBULATORY_CARE_PROVIDER_SITE_OTHER): Payer: Medicaid Other | Admitting: Family Medicine

## 2012-12-02 ENCOUNTER — Encounter: Payer: Self-pay | Admitting: Family Medicine

## 2012-12-02 VITALS — BP 116/77 | HR 86 | Temp 98.7°F | Ht 64.0 in | Wt 112.3 lb

## 2012-12-02 DIAGNOSIS — N390 Urinary tract infection, site not specified: Secondary | ICD-10-CM | POA: Insufficient documentation

## 2012-12-02 DIAGNOSIS — F323 Major depressive disorder, single episode, severe with psychotic features: Secondary | ICD-10-CM

## 2012-12-02 DIAGNOSIS — Z9851 Tubal ligation status: Secondary | ICD-10-CM

## 2012-12-02 DIAGNOSIS — M545 Low back pain, unspecified: Secondary | ICD-10-CM

## 2012-12-02 LAB — POCT URINALYSIS DIPSTICK
Bilirubin, UA: NEGATIVE
Blood, UA: NEGATIVE
Glucose, UA: NEGATIVE
Ketones, UA: NEGATIVE
Nitrite, UA: POSITIVE
Protein, UA: NEGATIVE
Spec Grav, UA: 1.02
Urobilinogen, UA: 0.2
pH, UA: 6.5

## 2012-12-02 LAB — POCT UA - MICROSCOPIC ONLY: Epithelial cells, urine per micros: >20

## 2012-12-02 MED ORDER — DULOXETINE HCL 30 MG PO CPEP: 30.0000 mg | ORAL_CAPSULE | Freq: Every day | ORAL | Status: DC

## 2012-12-02 MED ORDER — ALPRAZOLAM 0.5 MG PO TABS: 0.5000 mg | ORAL_TABLET | Freq: Two times a day (BID) | ORAL | Status: DC | PRN

## 2012-12-02 MED ORDER — CARISOPRODOL 350 MG PO TABS: 350.0000 mg | ORAL_TABLET | Freq: Three times a day (TID) | ORAL | Status: DC | PRN

## 2012-12-02 MED ORDER — CEPHALEXIN 500 MG PO CAPS: 500.0000 mg | ORAL_CAPSULE | Freq: Two times a day (BID) | ORAL | Status: DC

## 2012-12-02 NOTE — Assessment & Plan Note (Signed)
Counseling resources given.  Trial of cymbalta.

## 2012-12-02 NOTE — Assessment & Plan Note (Signed)
Trial of cymbalta.   
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
## 2012-12-02 NOTE — Assessment & Plan Note (Signed)
Rx despite poor quality specimen.  Will not treat clue cells since allergy to flagyl

## 2012-12-02 NOTE — Assessment & Plan Note (Signed)
No contraception needed.

## 2012-12-02 NOTE — Patient Instructions (Addendum)
See me in one month. Please go for counseling. I am worried about you. I am starting a new medicine that will hopefully help both with depression and pain.  Give it time.  It does not work immediately. I sent in a prescription for an antibiotic for your bladder infection.

## 2012-12-20 ENCOUNTER — Other Ambulatory Visit: Payer: Self-pay | Admitting: Family Medicine

## 2012-12-28 ENCOUNTER — Telehealth: Payer: Self-pay | Admitting: Family Medicine

## 2012-12-28 NOTE — Telephone Encounter (Signed)
Medicaid requires prior authorization for carisoprodol (Soma).  Literature suggests that it has higher abuse potential than cyclobenzaprine, my go to muscle relaxant.  I will use this as a good reason to switch.  Called and left message that we needed to talk and I needed to switch muscle relaxants.  I want to check up on her depression also.

## 2012-12-28 NOTE — Telephone Encounter (Signed)
Patient calls to return Dr. Cyndia Skeeters call.

## 2012-12-29 MED ORDER — CYCLOBENZAPRINE HCL 10 MG PO TABS: 10.0000 mg | ORAL_TABLET | Freq: Three times a day (TID) | ORAL | Status: DC | PRN

## 2012-12-29 NOTE — Telephone Encounter (Signed)
Multiple calls to patient with no answer.  LM that I was making the switch.

## 2013-01-16 IMAGING — CR DG RIBS W/ CHEST 3+V*R*
3 series · 3 of 3 positions shown · non-contrast
Comparison: 01/24/2012

CLINICAL DATA: Right rib fracture, shortness of breath, pain

RIGHT RIBS AND CHEST - 3+ VIEW

[w chest pa]
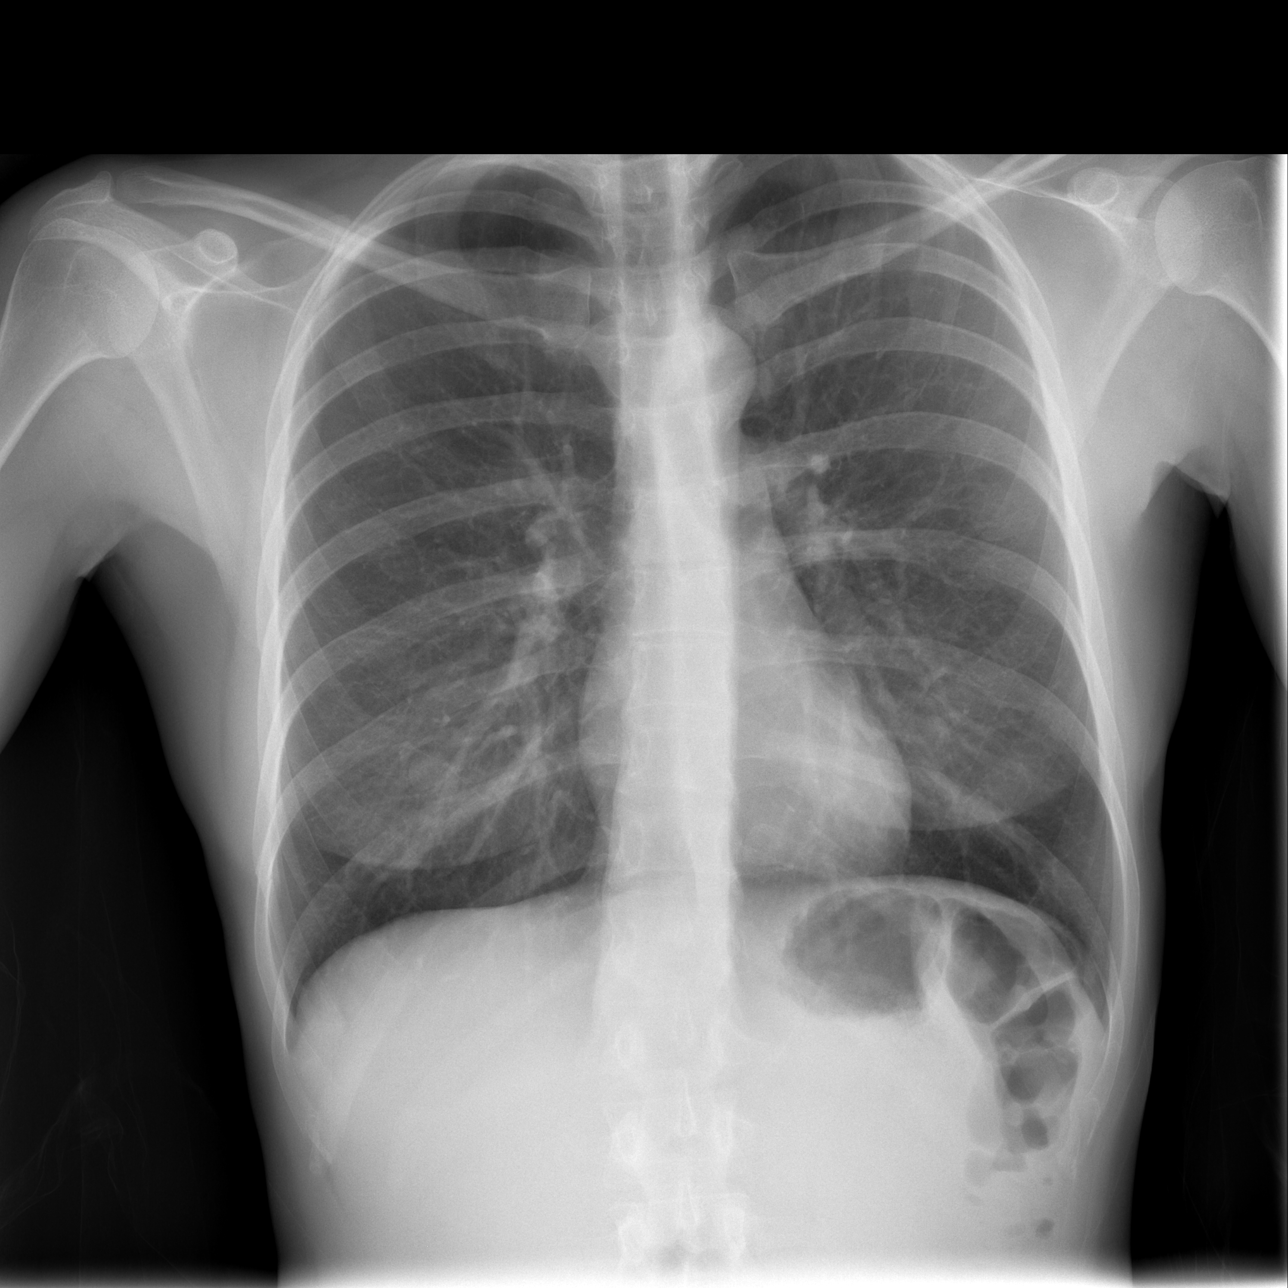

[w ribs ap/pa lower right]
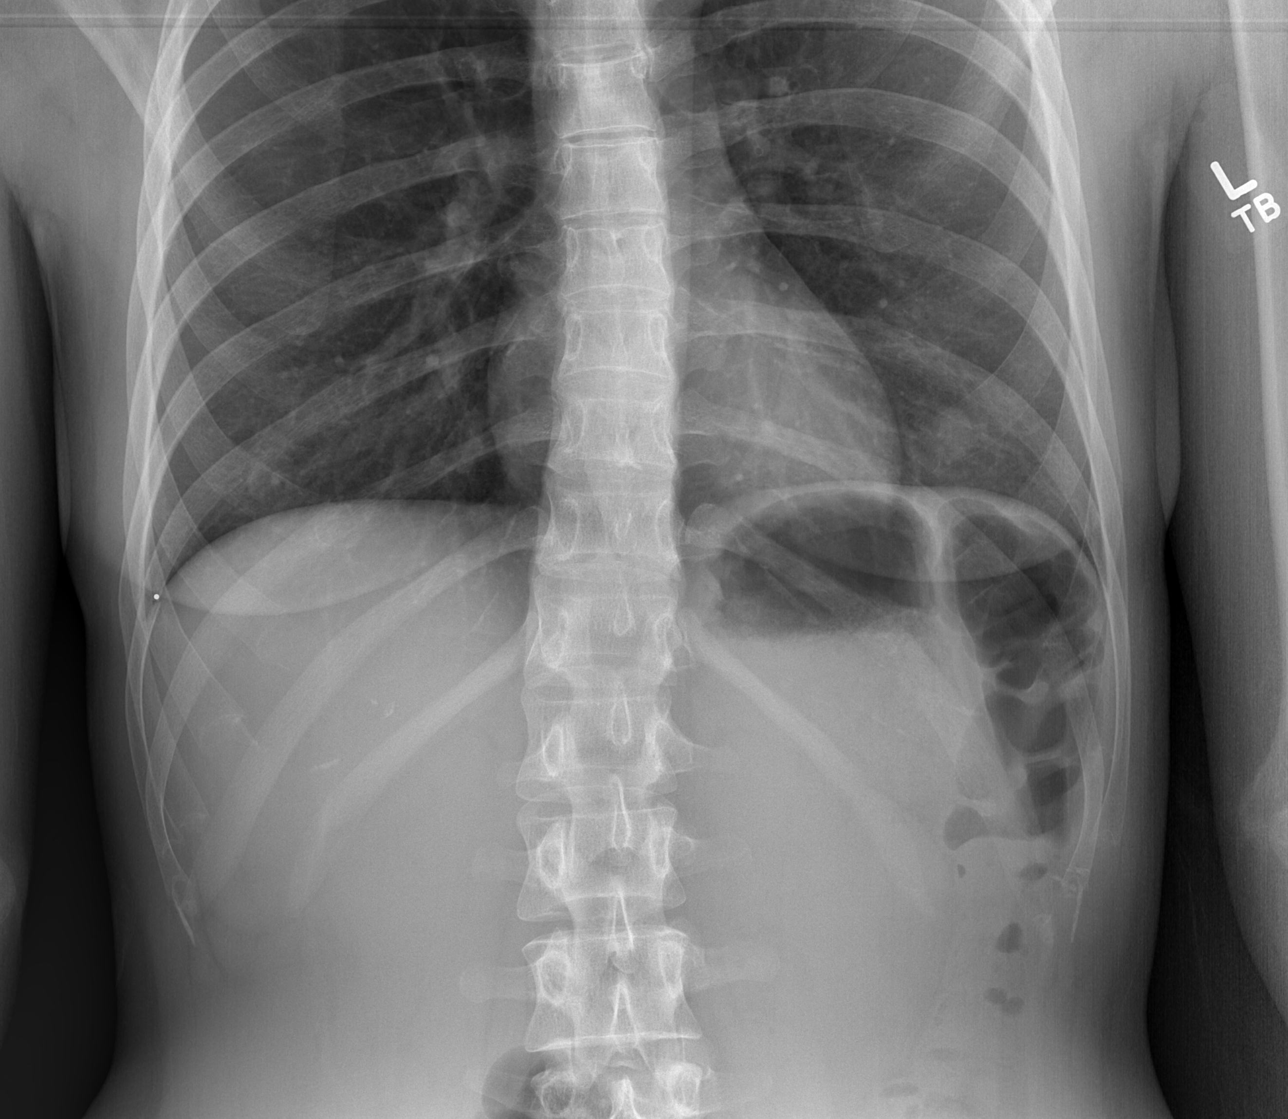

[w ribs oblique right]
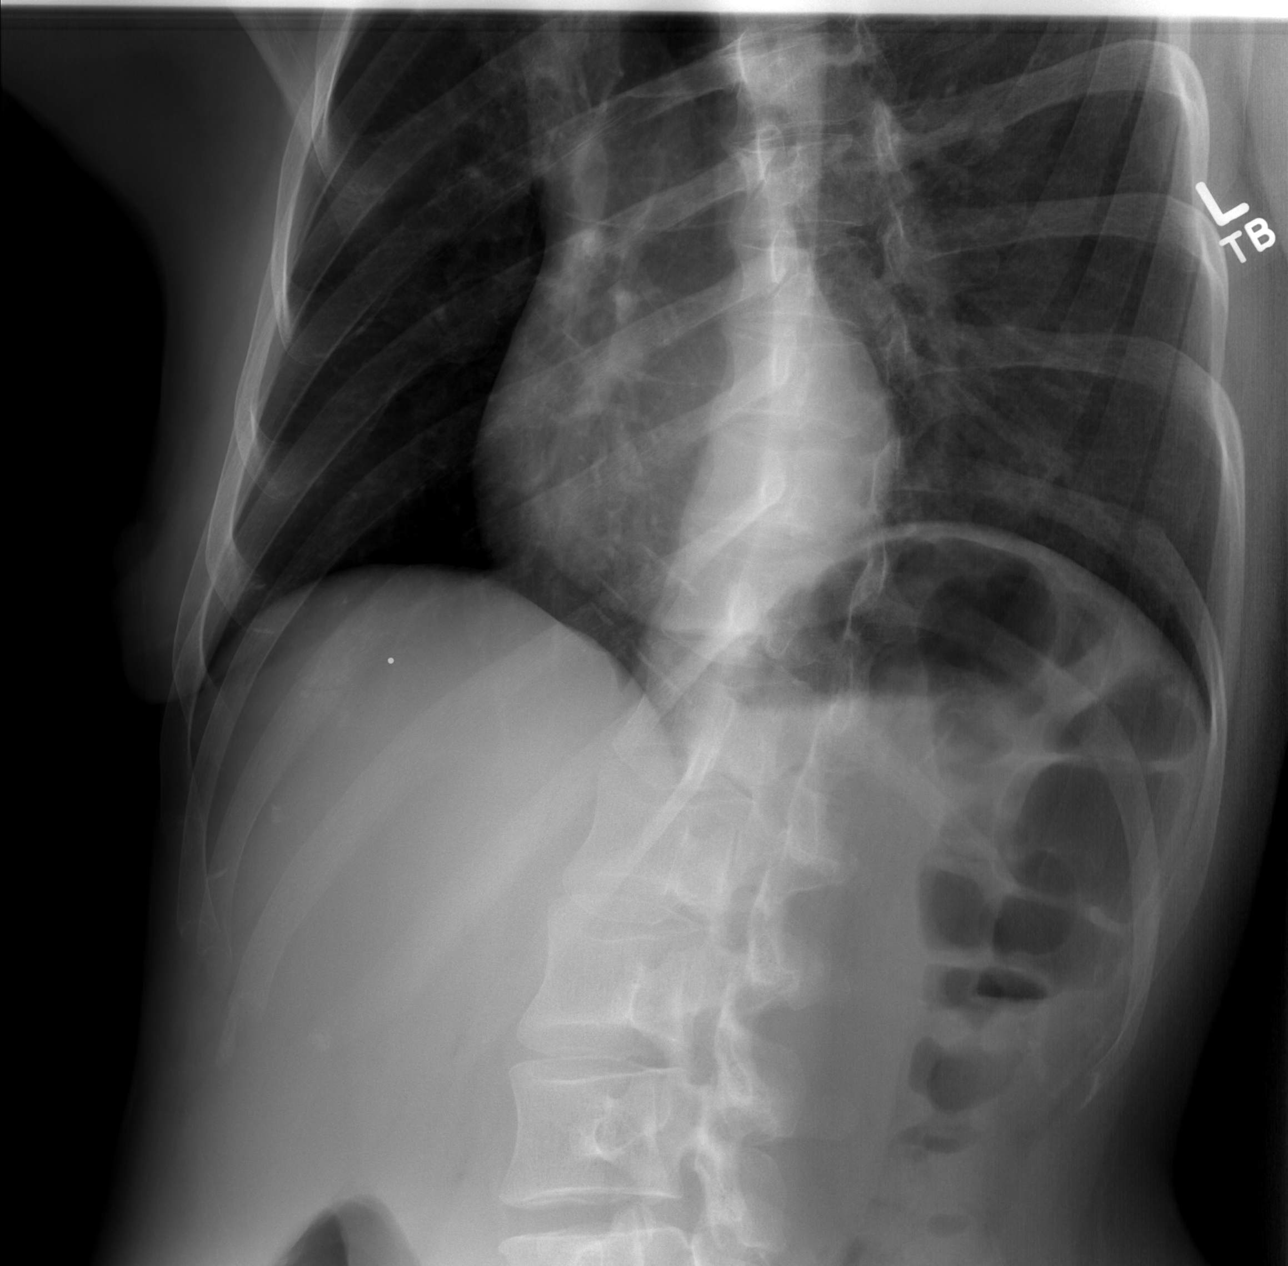

[3 of 3 positions shown; findings below may reference images not displayed]

FINDINGS: Redemonstration of an acute right lateral ninth rib
fracture.  No pneumothorax or effusion.  Lungs remain clear.  Lower
chest nipple shadows noted.  Normal heart size and vascularity.
Stable appearance.
IMPRESSION: Nondisplaced fracture right lateral ninth rib.

No effusion or pneumothorax

Lungs remain clear

## 2013-01-29 ENCOUNTER — Encounter (HOSPITAL_BASED_OUTPATIENT_CLINIC_OR_DEPARTMENT_OTHER): Payer: Self-pay | Admitting: Emergency Medicine

## 2013-01-29 ENCOUNTER — Emergency Department (HOSPITAL_BASED_OUTPATIENT_CLINIC_OR_DEPARTMENT_OTHER)
Admission: EM | Admit: 2013-01-29 | Discharge: 2013-01-29 | Disposition: A | Discharge status: Home / Self Care | Payer: Medicaid Other | Attending: Emergency Medicine | Admitting: Emergency Medicine

## 2013-01-29 ENCOUNTER — Emergency Department (HOSPITAL_BASED_OUTPATIENT_CLINIC_OR_DEPARTMENT_OTHER): Payer: Medicaid Other

## 2013-01-29 DIAGNOSIS — F17200 Nicotine dependence, unspecified, uncomplicated: Secondary | ICD-10-CM | POA: Insufficient documentation

## 2013-01-29 DIAGNOSIS — F3289 Other specified depressive episodes: Secondary | ICD-10-CM | POA: Insufficient documentation

## 2013-01-29 DIAGNOSIS — Z87768 Personal history of other specified (corrected) congenital malformations of integument, limbs and musculoskeletal system: Secondary | ICD-10-CM | POA: Insufficient documentation

## 2013-01-29 DIAGNOSIS — Z79899 Other long term (current) drug therapy: Secondary | ICD-10-CM | POA: Insufficient documentation

## 2013-01-29 DIAGNOSIS — F329 Major depressive disorder, single episode, unspecified: Secondary | ICD-10-CM | POA: Insufficient documentation

## 2013-01-29 DIAGNOSIS — S63509A Unspecified sprain of unspecified wrist, initial encounter: Secondary | ICD-10-CM | POA: Insufficient documentation

## 2013-01-29 DIAGNOSIS — Z792 Long term (current) use of antibiotics: Secondary | ICD-10-CM | POA: Insufficient documentation

## 2013-01-29 DIAGNOSIS — S63502A Unspecified sprain of left wrist, initial encounter: Secondary | ICD-10-CM

## 2013-01-29 DIAGNOSIS — Z8739 Personal history of other diseases of the musculoskeletal system and connective tissue: Secondary | ICD-10-CM | POA: Insufficient documentation

## 2013-01-29 DIAGNOSIS — F411 Generalized anxiety disorder: Secondary | ICD-10-CM | POA: Insufficient documentation

## 2013-01-29 DIAGNOSIS — Z8669 Personal history of other diseases of the nervous system and sense organs: Secondary | ICD-10-CM | POA: Insufficient documentation

## 2013-01-29 DIAGNOSIS — Z8776 Personal history of (corrected) congenital malformations of integument, limbs and musculoskeletal system: Secondary | ICD-10-CM | POA: Insufficient documentation

## 2013-01-29 DIAGNOSIS — Y929 Unspecified place or not applicable: Secondary | ICD-10-CM | POA: Insufficient documentation

## 2013-01-29 DIAGNOSIS — G8929 Other chronic pain: Secondary | ICD-10-CM | POA: Insufficient documentation

## 2013-01-29 DIAGNOSIS — S335XXA Sprain of ligaments of lumbar spine, initial encounter: Secondary | ICD-10-CM | POA: Insufficient documentation

## 2013-01-29 DIAGNOSIS — Y9352 Activity, horseback riding: Secondary | ICD-10-CM | POA: Insufficient documentation

## 2013-01-29 MED ORDER — OXYCODONE-ACETAMINOPHEN 5-325 MG PO TABS
1.0000 | ORAL_TABLET | Freq: Once | ORAL | Status: AC
  Administered 2013-01-29: 1 via ORAL
  Filled 2013-01-29: qty 1

## 2013-01-29 MED ORDER — METHOCARBAMOL 500 MG PO TABS: 500.0000 mg | ORAL_TABLET | Freq: Two times a day (BID) | ORAL | Status: DC

## 2013-01-29 MED ORDER — OXYCODONE-ACETAMINOPHEN 5-325 MG PO TABS: 2.0000 | ORAL_TABLET | ORAL | Status: DC | PRN

## 2013-01-29 NOTE — ED Provider Notes (Signed)
CSN: [id number]     Arrival date & time 01/29/13  2213 History  This chart was scribed for Gilda Crease, MD by Greggory Stallion, ED Scribe. This patient was seen in room MH12/MH12 and the patient's care was started at 10:24 PM.   Chief Complaint  Patient presents with  . Back Pain   The history is provided by the patient. No language interpreter was used.   HPI Comments: Kiara Murillo is a 32 y.o. female who presents to the Emergency Department complaining of sudden onset, constant lower back pain that started yesterday after she fell and landed on her back. She denies hitting her head or LOC. Pt states she sometimes gets shooting pain down her left leg. She states she also has left hand pain. Pt denies abdominal pain.   Past Medical History  Diagnosis Date  . Anxiety   . Depression   . Headache(784.0) hemiplegic migraine  . Chondromalacia of patella   . Lumbar spondylolysis   . Low back pain   . Chronic pelvic pain in female   . Chronic pain syndrome   . Spinal stenosis in cervical region   . Congenital spondylolisthesis   . Spinal stenosis in cervical region   . Congenital spondylolisthesis    Past Surgical History  Procedure Laterality Date  . Cesarean section    . Tonsillectomy    . Tubal ligation    . Mouth surgery     History reviewed. No pertinent family history. History  Substance Use Topics  . Smoking status: Current Every Day Smoker -- 1.00 packs/day    Types: Cigarettes  . Smokeless tobacco: Never Used  . Alcohol Use: No   OB History   Grav Para Term Preterm Abortions TAB SAB Ect Mult Living                 Review of Systems  Gastrointestinal: Negative for abdominal pain.  Musculoskeletal: Positive for arthralgias and back pain.  All other systems reviewed and are negative.    Allergies  Eggs or egg-derived products; Gabapentin; Hydrocodone; Tramadol; and Metronidazole  Home Medications   Current Outpatient Rx  Name  Route  Sig  Dispense   Refill  . ALPRAZolam (XANAX) 0.5 MG tablet   Oral   Take 1 tablet (0.5 mg total) by mouth 2 (two) times daily as needed for sleep. No early refills   60 tablet   3     Needs an appointment with me before any further re ...   . carisoprodol (SOMA) 350 MG tablet   Oral   Take 350 mg by mouth 4 (four) times daily as needed for muscle spasms.         . DULoxetine (CYMBALTA) 30 MG capsule   Oral   Take 1 capsule (30 mg total) by mouth daily.   30 capsule   3   . ibuprofen (ADVIL,MOTRIN) 800 MG tablet   Oral   Take 1 tablet (800 mg total) by mouth every 8 (eight) hours as needed. For pain.   90 tablet   6   . cephALEXin (KEFLEX) 500 MG capsule   Oral   Take 1 capsule (500 mg total) by mouth 2 (two) times daily.   14 capsule   0   . cyclobenzaprine (FLEXERIL) 10 MG tablet   Oral   Take 1 tablet (10 mg total) by mouth 3 (three) times daily as needed for muscle spasms.   90 tablet   6  Replaces Soma Rx.    BP 131/79  Pulse 101  Temp(Src) 98.5 F (36.9 C) (Oral)  Resp 16  Ht 5\' 4"  (1.626 m)  Wt 120 lb (54.432 kg)  BMI 20.59 kg/m2  SpO2 100%  LMP 01/15/2013  Physical Exam  Constitutional: She is oriented to person, place, and time. She appears well-developed and well-nourished. No distress.  HENT:  Head: Normocephalic and atraumatic.  Right Ear: Hearing normal.  Left Ear: Hearing normal.  Nose: Nose normal.  Mouth/Throat: Oropharynx is clear and moist and mucous membranes are normal.  Eyes: Conjunctivae and EOM are normal. Pupils are equal, round, and reactive to light.  Neck: Normal range of motion. Neck supple.  Cardiovascular: Regular rhythm, S1 normal and S2 normal.  Exam reveals no gallop and no friction rub.   No murmur heard. Pulmonary/Chest: Effort normal and breath sounds normal. No respiratory distress. She exhibits no tenderness.  Abdominal: Soft. Normal appearance and bowel sounds are normal. There is no hepatosplenomegaly. There is no tenderness.  There is no rebound, no guarding, no tenderness at McBurney's point and negative Murphy's sign. No hernia.  Musculoskeletal: Normal range of motion.       Left wrist: She exhibits tenderness.  +/- snuffbox tenderness  Neurological: She is alert and oriented to person, place, and time. She has normal strength. No cranial nerve deficit or sensory deficit. Coordination normal. GCS eye subscore is 4. GCS verbal subscore is 5. GCS motor subscore is 6.  Skin: Skin is warm, dry and intact. No rash noted. No cyanosis.  Psychiatric: She has a normal mood and affect. Her speech is normal and behavior is normal. Thought content normal.    ED Course  Procedures (including critical care time)  DIAGNOSTIC STUDIES: Oxygen Saturation is 100% on RA, normal by my interpretation.    COORDINATION OF CARE: 10:25 PM-Discussed treatment plan which includes xray with pt at bedside and pt agreed to plan.   Labs Review Labs Reviewed - No data to display Imaging Review No results found.  EKG Interpretation   None       MDM  Diagnosis: 1. Lumbar contusion/strain 2. Left wrist sprain, r/o fracture  Patient fell from a horse yesterday and is complaining of low back pain. X-ray lumbar spine is performed. No fracture seen. Neurologic exam unremarkable, normal strength, sensation, reflexes. Change in bowel or bladder function.  She is complaining of pain in left wrist area. No deformity noted. She does have some slight tenderness in the anatomical snuffbox region. X-ray negative for fracture. Patient to be splinted, followup with orthopedics.   I personally performed the services described in this documentation, which was scribed in my presence. The recorded information has been reviewed and is accurate.   Gilda Crease, MD 02/01/13 1006

## 2013-01-29 NOTE — ED Notes (Signed)
C/o lower back pain after falling off horse yesterday. Pt denies hitting head, no LOC. Pt is able to ambulate

## 2013-02-01 ENCOUNTER — Telehealth: Payer: Self-pay | Admitting: Family Medicine

## 2013-02-01 NOTE — Telephone Encounter (Signed)
Patient request to have her Xanax temporarily increased, due to her recently losing her brother, she has bee under a lot of stress lately.

## 2013-02-02 NOTE — Telephone Encounter (Signed)
Called and could not reach.  LM that she should make an appointment.  I need to see her face-to-face before I would consider increase in alprazolam.

## 2013-02-08 ENCOUNTER — Ambulatory Visit (INDEPENDENT_AMBULATORY_CARE_PROVIDER_SITE_OTHER): Payer: Medicaid Other | Admitting: Family Medicine

## 2013-02-08 VITALS — BP 133/78 | HR 95 | Temp 98.7°F | Ht 64.0 in | Wt 117.0 lb

## 2013-02-08 DIAGNOSIS — F323 Major depressive disorder, single episode, severe with psychotic features: Secondary | ICD-10-CM

## 2013-02-08 DIAGNOSIS — Z23 Encounter for immunization: Secondary | ICD-10-CM

## 2013-02-08 DIAGNOSIS — G894 Chronic pain syndrome: Secondary | ICD-10-CM

## 2013-02-08 MED ORDER — ALPRAZOLAM 0.5 MG PO TABS: 0.5000 mg | ORAL_TABLET | Freq: Three times a day (TID) | ORAL | Status: DC | PRN

## 2013-02-08 MED ORDER — DULOXETINE HCL 30 MG PO CPEP: 30.0000 mg | ORAL_CAPSULE | Freq: Every day | ORAL | Status: DC

## 2013-02-08 NOTE — Patient Instructions (Addendum)
I am sorry about your brother.   You need to be in counseling.  Two options. [facility]   Treats all kinds of emotional problems [address] [place], [place] [address] [phone]   [facility] does very good grief counseling. [facility] [phone]  Start taking the cymbalta See me in one month

## 2013-02-09 ENCOUNTER — Encounter: Payer: Self-pay | Admitting: Family Medicine

## 2013-02-09 NOTE — Assessment & Plan Note (Signed)
Worse pain with depression.
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
## 2013-02-09 NOTE — Assessment & Plan Note (Signed)
Worse.  Responded poorly to antidepressants in past.  Will up alprazolam to 75/month. Restart cymbalta.  Encourage counseling.

## 2013-02-15 ENCOUNTER — Ambulatory Visit: Payer: Medicaid Other | Admitting: Family Medicine

## 2013-03-14 ENCOUNTER — Emergency Department (HOSPITAL_BASED_OUTPATIENT_CLINIC_OR_DEPARTMENT_OTHER)
Admission: EM | Admit: 2013-03-14 | Discharge: 2013-03-14 | Disposition: A | Discharge status: Home / Self Care | Payer: Medicaid Other | Attending: Emergency Medicine | Admitting: Emergency Medicine

## 2013-03-14 ENCOUNTER — Emergency Department (HOSPITAL_BASED_OUTPATIENT_CLINIC_OR_DEPARTMENT_OTHER): Payer: Medicaid Other

## 2013-03-14 ENCOUNTER — Encounter (HOSPITAL_BASED_OUTPATIENT_CLINIC_OR_DEPARTMENT_OTHER): Payer: Self-pay | Admitting: Emergency Medicine

## 2013-03-14 DIAGNOSIS — Z3202 Encounter for pregnancy test, result negative: Secondary | ICD-10-CM | POA: Insufficient documentation

## 2013-03-14 DIAGNOSIS — N760 Acute vaginitis: Secondary | ICD-10-CM | POA: Insufficient documentation

## 2013-03-14 DIAGNOSIS — F17200 Nicotine dependence, unspecified, uncomplicated: Secondary | ICD-10-CM | POA: Insufficient documentation

## 2013-03-14 DIAGNOSIS — N390 Urinary tract infection, site not specified: Secondary | ICD-10-CM | POA: Insufficient documentation

## 2013-03-14 DIAGNOSIS — N83209 Unspecified ovarian cyst, unspecified side: Secondary | ICD-10-CM | POA: Insufficient documentation

## 2013-03-14 DIAGNOSIS — A499 Bacterial infection, unspecified: Secondary | ICD-10-CM | POA: Insufficient documentation

## 2013-03-14 DIAGNOSIS — F411 Generalized anxiety disorder: Secondary | ICD-10-CM | POA: Insufficient documentation

## 2013-03-14 DIAGNOSIS — F329 Major depressive disorder, single episode, unspecified: Secondary | ICD-10-CM | POA: Insufficient documentation

## 2013-03-14 DIAGNOSIS — B9689 Other specified bacterial agents as the cause of diseases classified elsewhere: Secondary | ICD-10-CM | POA: Insufficient documentation

## 2013-03-14 DIAGNOSIS — F3289 Other specified depressive episodes: Secondary | ICD-10-CM | POA: Insufficient documentation

## 2013-03-14 DIAGNOSIS — G894 Chronic pain syndrome: Secondary | ICD-10-CM | POA: Insufficient documentation

## 2013-03-14 DIAGNOSIS — Q762 Congenital spondylolisthesis: Secondary | ICD-10-CM | POA: Insufficient documentation

## 2013-03-14 DIAGNOSIS — Z79899 Other long term (current) drug therapy: Secondary | ICD-10-CM | POA: Insufficient documentation

## 2013-03-14 LAB — WET PREP, GENITAL
Trich, Wet Prep: NONE SEEN
Yeast Wet Prep HPF POC: NONE SEEN

## 2013-03-14 LAB — URINALYSIS, ROUTINE W REFLEX MICROSCOPIC
Bilirubin Urine: NEGATIVE
Glucose, UA: NEGATIVE mg/dL
Hgb urine dipstick: NEGATIVE
Ketones, ur: NEGATIVE mg/dL
Leukocytes, UA: NEGATIVE
Nitrite: POSITIVE — AB
Protein, ur: NEGATIVE mg/dL
Specific Gravity, Urine: 1.01 (ref 1.005–1.030)
Urobilinogen, UA: 0.2 mg/dL (ref 0.0–1.0)
pH: 7.5 (ref 5.0–8.0)

## 2013-03-14 LAB — URINE MICROSCOPIC-ADD ON

## 2013-03-14 LAB — PREGNANCY, URINE: Preg Test, Ur: NEGATIVE

## 2013-03-14 MED ORDER — OXYCODONE-ACETAMINOPHEN 5-325 MG PO TABS
ORAL_TABLET | ORAL | Status: AC
  Filled 2013-03-14: qty 2

## 2013-03-14 MED ORDER — CLINDAMYCIN HCL 150 MG PO CAPS: 150.0000 mg | ORAL_CAPSULE | Freq: Four times a day (QID) | ORAL | Status: DC

## 2013-03-14 MED ORDER — OXYCODONE-ACETAMINOPHEN 5-325 MG PO TABS: 2.0000 | ORAL_TABLET | ORAL | Status: DC | PRN

## 2013-03-14 MED ORDER — OXYCODONE-ACETAMINOPHEN 5-325 MG PO TABS
2.0000 | ORAL_TABLET | Freq: Once | ORAL | Status: AC
  Administered 2013-03-14: 2 via ORAL

## 2013-03-14 NOTE — ED Provider Notes (Signed)
CSN: [id number]     Arrival date & time 03/14/13  1526 History   First MD Initiated Contact with Patient 03/14/13 1613     Chief Complaint  Patient presents with  . Abdominal Pain   (Consider location/radiation/quality/duration/timing/severity/associated sxs/prior Treatment) Patient is a 32 y.o. female presenting with abdominal pain. The history is provided by the patient. No language interpreter was used.  Abdominal Pain Pain location:  LLQ Pain quality: aching   Pain radiates to:  Does not radiate Pain severity:  Moderate Onset quality:  Sudden Timing:  Constant Progression:  Worsening Chronicity:  New Relieved by:  Nothing Worsened by:  Nothing tried Ineffective treatments:  None tried Associated symptoms: vaginal discharge   Associated symptoms: no vomiting     Past Medical History  Diagnosis Date  . Anxiety   . Depression   . Headache(784.0) hemiplegic migraine  . Chondromalacia of patella   . Lumbar spondylolysis   . Low back pain   . Chronic pelvic pain in female   . Chronic pain syndrome   . Spinal stenosis in cervical region   . Congenital spondylolisthesis   . Spinal stenosis in cervical region   . Congenital spondylolisthesis    Past Surgical History  Procedure Laterality Date  . Cesarean section    . Tonsillectomy    . Tubal ligation    . Mouth surgery     No family history on file. History  Substance Use Topics  . Smoking status: Current Every Day Smoker -- 1.00 packs/day    Types: Cigarettes  . Smokeless tobacco: Never Used  . Alcohol Use: No   OB History   Grav Para Term Preterm Abortions TAB SAB Ect Mult Living                 Review of Systems  Gastrointestinal: Positive for abdominal pain. Negative for vomiting.  Genitourinary: Positive for vaginal discharge.  All other systems reviewed and are negative.    Allergies  Eggs or egg-derived products; Gabapentin; Hydrocodone; Tramadol; and Metronidazole  Home Medications   Current  Outpatient Rx  Name  Route  Sig  Dispense  Refill  . ALPRAZolam (XANAX) 0.5 MG tablet   Oral   Take 1 tablet (0.5 mg total) by mouth 3 (three) times daily as needed for anxiety. No early refills   75 tablet   3     Needs an appointment with me before any further re ...   . carisoprodol (SOMA) 350 MG tablet   Oral   Take 350 mg by mouth 4 (four) times daily as needed for muscle spasms.         . DULoxetine (CYMBALTA) 30 MG capsule   Oral   Take 1 capsule (30 mg total) by mouth daily.   30 capsule   3   . ibuprofen (ADVIL,MOTRIN) 800 MG tablet   Oral   Take 1 tablet (800 mg total) by mouth every 8 (eight) hours as needed. For pain.   90 tablet   6    BP 138/85  Pulse 90  Temp(Src) 98.3 F (36.8 C) (Oral)  Resp 20  Ht 5\' 4"  (1.626 m)  Wt 117 lb (53.071 kg)  BMI 20.07 kg/m2  SpO2 100%  LMP 02/21/2013 Physical Exam  Nursing note and vitals reviewed. Constitutional: She appears well-developed and well-nourished.  HENT:  Head: Normocephalic.  Right Ear: External ear normal.  Left Ear: External ear normal.  Mouth/Throat: Oropharynx is clear and moist.  Eyes: Pupils are  equal, round, and reactive to light.  Neck: Normal range of motion.  Cardiovascular: Normal rate.   Pulmonary/Chest: Effort normal.  Abdominal: Soft. There is tenderness.  Genitourinary: Uterus normal. Vaginal discharge found.  Musculoskeletal: Normal range of motion.  Neurological: She is alert. She has normal reflexes.  Skin: Skin is warm.  Psychiatric: She has a normal mood and affect.    ED Course  Procedures (including critical care time) Labs Review Labs Reviewed  URINALYSIS, ROUTINE W REFLEX MICROSCOPIC - Abnormal; Notable for the following:    APPearance CLOUDY (*)    Nitrite POSITIVE (*)    All other components within normal limits  URINE MICROSCOPIC-ADD ON - Abnormal; Notable for the following:    Squamous Epithelial / LPF FEW (*)    Bacteria, UA MANY (*)    All other components  within normal limits  PREGNANCY, URINE   Imaging Review No results found.  EKG Interpretation   None       MDM   1. UTI (lower urinary tract infection)   2. Bacterial vaginal infection   3. Ovarian cyst     RX for percocet. RX for clindamycin  See Dr. Leveda Anna for recheck next week   Elson Areas, PA-C 03/14/13 1840

## 2013-03-14 NOTE — ED Notes (Signed)
Lower left quad pain off and on x 2 days. Progressively worse.

## 2013-03-14 NOTE — ED Notes (Signed)
Apple juice given per pt request.  Waiting for EDP review and dispo.

## 2013-03-14 NOTE — ED Notes (Signed)
Pelvic cart set up at bedside  

## 2013-03-14 NOTE — ED Provider Notes (Signed)
Medical screening examination/treatment/procedure(s) were performed by non-physician practitioner and as supervising physician I was immediately available for consultation/collaboration.  EKG Interpretation   None        Atheena Spano, MD 03/14/13 2021 

## 2013-03-15 LAB — GC/CHLAMYDIA PROBE AMP
CT Probe RNA: NEGATIVE
GC Probe RNA: NEGATIVE

## 2013-05-19 ENCOUNTER — Telehealth: Payer: Self-pay | Admitting: Family Medicine

## 2013-05-19 DIAGNOSIS — M47817 Spondylosis without myelopathy or radiculopathy, lumbosacral region: Secondary | ICD-10-CM

## 2013-05-19 DIAGNOSIS — G894 Chronic pain syndrome: Secondary | ICD-10-CM

## 2013-05-19 DIAGNOSIS — F1110 Opioid abuse, uncomplicated: Secondary | ICD-10-CM

## 2013-05-19 NOTE — Telephone Encounter (Signed)
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 Pt was found unresponsive in a [employer] parking lot in [place] this morning. [facility]. She had OD on Xanax. She had it filled yesterday and only has 4 left. Verdon CumminsJesse was advised to come straight to [facility] to talk to Dr Leveda AnnaHensel to discuss her abuse of her medicines. He is very upset.  He wants help in getting her straightened out. He wants to talk to dr Leveda Annahensel

## 2013-05-22 NOTE — Telephone Encounter (Signed)
Spoke with Verdon CumminsJesse.  Verified Misty StanleyLisa had an unintentional OD of both alprazolam and soma.  I called pharmacy and cancelled remaining refills on both.  Clearly labeled chart no more controled substances or sedating drugs through this office.

## 2013-05-22 NOTE — Telephone Encounter (Signed)
Verdon CumminsJesse is returning Dr. Cyndia SkeetersHensel's call. Myriam Jacobsonjw

## 2013-05-22 NOTE — Telephone Encounter (Signed)
Attempted to call at 10AM.  Will try again.

## 2013-06-09 ENCOUNTER — Emergency Department (HOSPITAL_BASED_OUTPATIENT_CLINIC_OR_DEPARTMENT_OTHER): Payer: Medicaid Other

## 2013-06-09 ENCOUNTER — Emergency Department (HOSPITAL_BASED_OUTPATIENT_CLINIC_OR_DEPARTMENT_OTHER)
Admission: EM | Admit: 2013-06-09 | Discharge: 2013-06-09 | Disposition: A | Discharge status: Home / Self Care | Payer: Medicaid Other | Attending: Emergency Medicine | Admitting: Emergency Medicine

## 2013-06-09 ENCOUNTER — Encounter (HOSPITAL_BASED_OUTPATIENT_CLINIC_OR_DEPARTMENT_OTHER): Payer: Self-pay | Admitting: Emergency Medicine

## 2013-06-09 DIAGNOSIS — N390 Urinary tract infection, site not specified: Secondary | ICD-10-CM | POA: Insufficient documentation

## 2013-06-09 DIAGNOSIS — F411 Generalized anxiety disorder: Secondary | ICD-10-CM | POA: Insufficient documentation

## 2013-06-09 DIAGNOSIS — Z792 Long term (current) use of antibiotics: Secondary | ICD-10-CM | POA: Insufficient documentation

## 2013-06-09 DIAGNOSIS — G43409 Hemiplegic migraine, not intractable, without status migrainosus: Secondary | ICD-10-CM | POA: Insufficient documentation

## 2013-06-09 DIAGNOSIS — Z87768 Personal history of other specified (corrected) congenital malformations of integument, limbs and musculoskeletal system: Secondary | ICD-10-CM | POA: Insufficient documentation

## 2013-06-09 DIAGNOSIS — Y929 Unspecified place or not applicable: Secondary | ICD-10-CM | POA: Insufficient documentation

## 2013-06-09 DIAGNOSIS — M549 Dorsalgia, unspecified: Secondary | ICD-10-CM

## 2013-06-09 DIAGNOSIS — R296 Repeated falls: Secondary | ICD-10-CM | POA: Insufficient documentation

## 2013-06-09 DIAGNOSIS — Z79899 Other long term (current) drug therapy: Secondary | ICD-10-CM | POA: Insufficient documentation

## 2013-06-09 DIAGNOSIS — Z3202 Encounter for pregnancy test, result negative: Secondary | ICD-10-CM | POA: Insufficient documentation

## 2013-06-09 DIAGNOSIS — F3289 Other specified depressive episodes: Secondary | ICD-10-CM | POA: Insufficient documentation

## 2013-06-09 DIAGNOSIS — IMO0002 Reserved for concepts with insufficient information to code with codable children: Secondary | ICD-10-CM | POA: Insufficient documentation

## 2013-06-09 DIAGNOSIS — F17200 Nicotine dependence, unspecified, uncomplicated: Secondary | ICD-10-CM | POA: Insufficient documentation

## 2013-06-09 DIAGNOSIS — G894 Chronic pain syndrome: Secondary | ICD-10-CM | POA: Insufficient documentation

## 2013-06-09 DIAGNOSIS — Y9389 Activity, other specified: Secondary | ICD-10-CM | POA: Insufficient documentation

## 2013-06-09 DIAGNOSIS — Z8742 Personal history of other diseases of the female genital tract: Secondary | ICD-10-CM | POA: Insufficient documentation

## 2013-06-09 DIAGNOSIS — Z8776 Personal history of (corrected) congenital malformations of integument, limbs and musculoskeletal system: Secondary | ICD-10-CM | POA: Insufficient documentation

## 2013-06-09 DIAGNOSIS — F329 Major depressive disorder, single episode, unspecified: Secondary | ICD-10-CM | POA: Insufficient documentation

## 2013-06-09 DIAGNOSIS — Z8739 Personal history of other diseases of the musculoskeletal system and connective tissue: Secondary | ICD-10-CM | POA: Insufficient documentation

## 2013-06-09 LAB — URINALYSIS, ROUTINE W REFLEX MICROSCOPIC
Bilirubin Urine: NEGATIVE
Glucose, UA: NEGATIVE mg/dL
Ketones, ur: NEGATIVE mg/dL
Leukocytes, UA: NEGATIVE
Nitrite: POSITIVE — AB
Protein, ur: NEGATIVE mg/dL
Specific Gravity, Urine: 1.007 (ref 1.005–1.030)
Urobilinogen, UA: 0.2 mg/dL (ref 0.0–1.0)
pH: 6 (ref 5.0–8.0)

## 2013-06-09 LAB — URINE MICROSCOPIC-ADD ON

## 2013-06-09 LAB — PREGNANCY, URINE: Preg Test, Ur: NEGATIVE

## 2013-06-09 MED ORDER — CEPHALEXIN 500 MG PO CAPS: 500.0000 mg | ORAL_CAPSULE | Freq: Four times a day (QID) | ORAL | Status: DC

## 2013-06-09 MED ORDER — IBUPROFEN 800 MG PO TABS: 800.0000 mg | ORAL_TABLET | Freq: Three times a day (TID) | ORAL | Status: DC

## 2013-06-09 NOTE — ED Notes (Signed)
Pt c/o L lower back pain, no nausea, vomiting,, no dysuria.  C/o intermittent L leg pain also.

## 2013-06-09 NOTE — ED Notes (Signed)
Xray tech advised need for upreg due to unable to shield that area for xray-EDP Rancour notified

## 2013-06-09 NOTE — ED Provider Notes (Signed)
CSN: [id number]     Arrival date & time 06/09/13  1432 History   First MD Initiated Contact with Patient 06/09/13 1459     Chief Complaint  Patient presents with  . Back Pain     (Consider location/radiation/quality/duration/timing/severity/associated sxs/prior Treatment) HPI Comments: Patient presents with left-sided low back pain after falling on the ice yesterday. She denies hitting her head or losing consciousness. She denies any weakness, numbness or tingling. No bowel or bladder incontinence. No fever or vomiting. No urinary symptoms. She reports previous history of back problems and chronic pain. She's been taking Motrin at home without relief. She's a history of depression and anxiety and previous overdose. She denies any abdominal pain, nausea or vomiting. She is able to ambulate without assistance.  The history is provided by the patient.    Past Medical History  Diagnosis Date  . Anxiety   . Depression   . Headache(784.0) hemiplegic migraine  . Chondromalacia of patella   . Lumbar spondylolysis   . Low back pain   . Chronic pelvic pain in female   . Chronic pain syndrome   . Spinal stenosis in cervical region   . Congenital spondylolisthesis   . Spinal stenosis in cervical region   . Congenital spondylolisthesis    Past Surgical History  Procedure Laterality Date  . Cesarean section    . Tonsillectomy    . Tubal ligation    . Mouth surgery     History reviewed. No pertinent family history. History  Substance Use Topics  . Smoking status: Current Every Day Smoker -- 1.00 packs/day    Types: Cigarettes  . Smokeless tobacco: Never Used  . Alcohol Use: No   OB History   Grav Para Term Preterm Abortions TAB SAB Ect Mult Living                 Review of Systems  Constitutional: Negative for fever, activity change and appetite change.  HENT: Negative for congestion and rhinorrhea.   Respiratory: Negative for cough, chest tightness and shortness of breath.    Cardiovascular: Negative for chest pain.  Gastrointestinal: Negative for nausea, vomiting and abdominal pain.  Genitourinary: Negative for dysuria and hematuria.  Musculoskeletal: Positive for back pain. Negative for arthralgias and myalgias.  Skin: Negative for rash.  Neurological: Negative for dizziness, weakness and headaches.  A complete 10 system review of systems was obtained and all systems are negative except as noted in the HPI and PMH.      Allergies  Eggs or egg-derived products; Gabapentin; Hydrocodone; Tramadol; and Metronidazole  Home Medications   Current Outpatient Rx  Name  Route  Sig  Dispense  Refill  . ALPRAZolam (XANAX) 0.5 MG tablet   Oral   Take 0.5 mg by mouth 3 (three) times daily as needed for anxiety.         . carisoprodol (SOMA) 350 MG tablet   Oral   Take 350 mg by mouth 3 (three) times daily.         . DULoxetine (CYMBALTA) 30 MG capsule   Oral   Take 1 capsule (30 mg total) by mouth daily.   30 capsule   3   . ibuprofen (ADVIL,MOTRIN) 800 MG tablet   Oral   Take 1 tablet (800 mg total) by mouth every 8 (eight) hours as needed. For pain.   90 tablet   6   . cephALEXin (KEFLEX) 500 MG capsule   Oral   Take 1 capsule (500  mg total) by mouth 4 (four) times daily.   40 capsule   0   . ibuprofen (ADVIL,MOTRIN) 800 MG tablet   Oral   Take 1 tablet (800 mg total) by mouth 3 (three) times daily.   21 tablet   0    BP 132/85  Pulse 89  Temp(Src) 98.1 F (36.7 C) (Oral)  Resp 20  Ht 5\' 4"  (1.626 m)  Wt 120 lb (54.432 kg)  BMI 20.59 kg/m2  SpO2 100%  LMP 06/08/2013 Physical Exam  Constitutional: She appears well-developed and well-nourished. No distress.  Texting on her phone as I enter the room  HENT:  Head: Normocephalic and atraumatic.  Mouth/Throat: Oropharynx is clear and moist. No oropharyngeal exudate.  Eyes: Conjunctivae and EOM are normal.  Neck: Normal range of motion. Neck supple.  Cardiovascular: Normal rate,  regular rhythm and normal heart sounds.   Pulmonary/Chest: Effort normal and breath sounds normal. No respiratory distress.  Abdominal: Soft. Bowel sounds are normal. She exhibits no distension. There is no tenderness. There is no rebound and no guarding.  Musculoskeletal: Normal range of motion. She exhibits tenderness. She exhibits no edema.  L paraspinal lumbar pain 5/5 strength in bilateral lower extremities. Ankle plantar and dorsiflexion intact. Great toe extension intact bilaterally. +2 DP and PT pulses. +2 patellar reflexes bilaterally. Normal gait.   Neurological: She is alert. No cranial nerve deficit. She exhibits normal muscle tone. Coordination normal.  Skin: Skin is warm.    ED Course  Procedures (including critical care time) Labs Review Labs Reviewed  URINALYSIS, ROUTINE W REFLEX MICROSCOPIC - Abnormal; Notable for the following:    APPearance CLOUDY (*)    Hgb urine dipstick MODERATE (*)    Nitrite POSITIVE (*)    All other components within normal limits  URINE MICROSCOPIC-ADD ON - Abnormal; Notable for the following:    Squamous Epithelial / LPF FEW (*)    Bacteria, UA MANY (*)    All other components within normal limits  PREGNANCY, URINE   Imaging Review Dg Lumbar Spine Complete  06/09/2013   CLINICAL DATA:  Post fall, now with low back pain  EXAM: LUMBAR SPINE - COMPLETE 4+ VIEW  COMPARISON:  DG LUMBAR SPINE COMPLETE dated 01/29/2013; CT L SPINE W/CM dated 03/24/2011  FINDINGS: There are 5 non-rib-bearing lumbar type vertebral bodies.  There is a very mild scoliotic curvature of the thoracolumbar spine.  Grossly unchanged sequela of bilateral L5 pars defects with associated very minimal (approximately 2 mm) of anterolisthesis of L5 upon S1.  Lumbar vertebral body heights are preserved.  Intervertebral disc spaces are preserved.  Limited visualization the bilateral SI joints and hips is normal.  Regional bowel gas pattern and soft tissues are normal.  IMPRESSION: 1. No  acute findings. 2. Bilateral L5 pars defects with associated minimal (approximately 2 mm) of anterolisthesis of L5 upon S1.   Electronically Signed   By: Simonne Come M.D.   On: 06/09/2013 16:17    EKG Interpretation   None       MDM   Final diagnoses:  Back pain  Urinary tract infection   Acute on chronic lower back pain after fall.  No weakness, numbness, tingling.  No incontinence or other red flags.  Previous records reviewed patient has documented chronic pain syndrome. She has a history of overdose on soma and Xanax. Narcotics today will not be prescribed.  X-rays negative for fracture. History of exam not consistent with cauda equina syndrome or cord compression.  We'll treat with anti-inflammatories, antibiotics for possible UTI. Return precautions discussed.    Glynn OctaveStephen Junita Kubota, MD 06/09/13 [phone]

## 2013-06-09 NOTE — Discharge Instructions (Signed)
Back Pain, Adult Low back pain is very common. About 1 in 5 people have back pain.The cause of low back pain is rarely dangerous. The pain often gets better over time.About half of people with a sudden onset of back pain feel better in just 2 weeks. About 8 in 10 people feel better by 6 weeks.  CAUSES Some common causes of back pain include:  Strain of the muscles or ligaments supporting the spine.  Wear and tear (degeneration) of the spinal discs.  Arthritis.  Direct injury to the back. DIAGNOSIS Most of the time, the direct cause of low back pain is not known.However, back pain can be treated effectively even when the exact cause of the pain is unknown.Answering your caregiver's questions about your overall health and symptoms is one of the most accurate ways to make sure the cause of your pain is not dangerous. If your caregiver needs more information, he or she may order lab work or imaging tests (X-rays or MRIs).However, even if imaging tests show changes in your back, this usually does not require surgery. HOME CARE INSTRUCTIONS For many people, back pain returns.Since low back pain is rarely dangerous, it is often a condition that people can learn to manageon their own.   Remain active. It is stressful on the back to sit or stand in one place. Do not sit, drive, or stand in one place for more than 30 minutes at a time. Take short walks on level surfaces as soon as pain allows.Try to increase the length of time you walk each day.  Do not stay in bed.Resting more than 1 or 2 days can delay your recovery.  Do not avoid exercise or work.Your body is made to move.It is not dangerous to be active, even though your back may hurt.Your back will likely heal faster if you return to being active before your pain is gone.  Pay attention to your body when you bend and lift. Many people have less discomfortwhen lifting if they bend their knees, keep the load close to their bodies,and  avoid twisting. Often, the most comfortable positions are those that put less stress on your recovering back.  Find a comfortable position to sleep. Use a firm mattress and lie on your side with your knees slightly bent. If you lie on your back, put a pillow under your knees.  Only take over-the-counter or prescription medicines as directed by your caregiver. Over-the-counter medicines to reduce pain and inflammation are often the most helpful.Your caregiver may prescribe muscle relaxant drugs.These medicines help dull your pain so you can more quickly return to your normal activities and healthy exercise.  Put ice on the injured area.  Put ice in a plastic bag.  Place a towel between your skin and the bag.  Leave the ice on for 15-20 minutes, 03-04 times a day for the first 2 to 3 days. After that, ice and heat may be alternated to reduce pain and spasms.  Ask your caregiver about trying back exercises and gentle massage. This may be of some benefit.  Avoid feeling anxious or stressed.Stress increases muscle tension and can worsen back pain.It is important to recognize when you are anxious or stressed and learn ways to manage it.Exercise is a great option. SEEK MEDICAL CARE IF:  You have pain that is not relieved with rest or medicine.  You have pain that does not improve in 1 week.  You have new symptoms.  You are generally not feeling well. SEEK   IMMEDIATE MEDICAL CARE IF:   You have pain that radiates from your back into your legs.  You develop new bowel or bladder control problems.  You have unusual weakness or numbness in your arms or legs.  You develop nausea or vomiting.  You develop abdominal pain.  You feel faint. Document Released: 04/06/2005 Document Revised: 10/06/2011 Document Reviewed: 08/25/2010 ExitCare Patient Information 2014 [employer].  

## 2013-06-13 ENCOUNTER — Other Ambulatory Visit: Payer: Self-pay | Admitting: Family Medicine

## 2013-07-27 ENCOUNTER — Telehealth: Payer: Self-pay | Admitting: Family Medicine

## 2013-07-27 DIAGNOSIS — M545 Low back pain, unspecified: Secondary | ICD-10-CM

## 2013-07-27 NOTE — Telephone Encounter (Signed)
Pt would like meds refilled such as musle relaxer and alprazolam Please advise

## 2013-07-31 MED ORDER — CARISOPRODOL 350 MG PO TABS: 350.0000 mg | ORAL_TABLET | Freq: Three times a day (TID) | ORAL | Status: DC | PRN

## 2013-07-31 NOTE — Telephone Encounter (Signed)
Dear Cliffton AstersWhite Team I am OK giving her a short Rx for her soma (muscle relaxer) but I need more info before I could give her alprazolam--she has not had that since 2012. Ask her what is going on. THANKS! Please call in the soma as above Nestor RampSara L Neal

## 2013-07-31 NOTE — Telephone Encounter (Signed)
Tried calling, line busy, will try again later. Dr Jennette KettleNeal did you see the phone note from 05/19/13?

## 2013-08-01 NOTE — Telephone Encounter (Signed)
rx has not been filled

## 2013-08-01 NOTE — Telephone Encounter (Signed)
Dear Cliffton AstersWhite Team NO-I did NOT see that phonenote. Please cancel the soma I e rx and tell her no sedating meds Dr Leveda AnnaHensel says that is "clearly labeled on chart"...where does he label that [facility]! Nestor RampSara L Shylo Dillenbeck

## 2013-09-17 ENCOUNTER — Emergency Department (HOSPITAL_BASED_OUTPATIENT_CLINIC_OR_DEPARTMENT_OTHER): Payer: Medicaid Other

## 2013-09-17 ENCOUNTER — Encounter (HOSPITAL_BASED_OUTPATIENT_CLINIC_OR_DEPARTMENT_OTHER): Payer: Self-pay | Admitting: Emergency Medicine

## 2013-09-17 ENCOUNTER — Emergency Department (HOSPITAL_BASED_OUTPATIENT_CLINIC_OR_DEPARTMENT_OTHER)
Admission: EM | Admit: 2013-09-17 | Discharge: 2013-09-17 | Disposition: A | Discharge status: Home / Self Care | Payer: Medicaid Other | Attending: Emergency Medicine | Admitting: Emergency Medicine

## 2013-09-17 DIAGNOSIS — F329 Major depressive disorder, single episode, unspecified: Secondary | ICD-10-CM | POA: Insufficient documentation

## 2013-09-17 DIAGNOSIS — S6000XA Contusion of unspecified finger without damage to nail, initial encounter: Secondary | ICD-10-CM | POA: Insufficient documentation

## 2013-09-17 DIAGNOSIS — Z8739 Personal history of other diseases of the musculoskeletal system and connective tissue: Secondary | ICD-10-CM | POA: Insufficient documentation

## 2013-09-17 DIAGNOSIS — Y9289 Other specified places as the place of occurrence of the external cause: Secondary | ICD-10-CM | POA: Insufficient documentation

## 2013-09-17 DIAGNOSIS — Q762 Congenital spondylolisthesis: Secondary | ICD-10-CM | POA: Insufficient documentation

## 2013-09-17 DIAGNOSIS — Z79899 Other long term (current) drug therapy: Secondary | ICD-10-CM | POA: Insufficient documentation

## 2013-09-17 DIAGNOSIS — Z791 Long term (current) use of non-steroidal anti-inflammatories (NSAID): Secondary | ICD-10-CM | POA: Insufficient documentation

## 2013-09-17 DIAGNOSIS — T148XXA Other injury of unspecified body region, initial encounter: Secondary | ICD-10-CM

## 2013-09-17 DIAGNOSIS — G894 Chronic pain syndrome: Secondary | ICD-10-CM | POA: Insufficient documentation

## 2013-09-17 DIAGNOSIS — S6991XA Unspecified injury of right wrist, hand and finger(s), initial encounter: Secondary | ICD-10-CM

## 2013-09-17 DIAGNOSIS — F17200 Nicotine dependence, unspecified, uncomplicated: Secondary | ICD-10-CM | POA: Insufficient documentation

## 2013-09-17 DIAGNOSIS — Y9389 Activity, other specified: Secondary | ICD-10-CM | POA: Insufficient documentation

## 2013-09-17 DIAGNOSIS — F411 Generalized anxiety disorder: Secondary | ICD-10-CM | POA: Insufficient documentation

## 2013-09-17 DIAGNOSIS — F3289 Other specified depressive episodes: Secondary | ICD-10-CM | POA: Insufficient documentation

## 2013-09-17 DIAGNOSIS — W230XXA Caught, crushed, jammed, or pinched between moving objects, initial encounter: Secondary | ICD-10-CM | POA: Insufficient documentation

## 2013-09-17 MED ORDER — IBUPROFEN 800 MG PO TABS: 800.0000 mg | ORAL_TABLET | Freq: Three times a day (TID) | ORAL | Status: DC | PRN

## 2013-09-17 MED ORDER — IBUPROFEN 800 MG PO TABS
800.0000 mg | ORAL_TABLET | Freq: Once | ORAL | Status: AC
  Administered 2013-09-17: 800 mg via ORAL
  Filled 2013-09-17: qty 1

## 2013-09-17 MED ORDER — OXYCODONE-ACETAMINOPHEN 5-325 MG PO TABS: 1.0000 | ORAL_TABLET | ORAL | Status: DC | PRN

## 2013-09-17 MED ORDER — OXYCODONE-ACETAMINOPHEN 5-325 MG PO TABS
2.0000 | ORAL_TABLET | Freq: Once | ORAL | Status: AC
  Administered 2013-09-17: 2 via ORAL
  Filled 2013-09-17: qty 2

## 2013-09-17 NOTE — Discharge Instructions (Signed)
Contusion °A contusion is a deep bruise. Contusions are the result of an injury that caused bleeding under the skin. The contusion may turn blue, purple, or yellow. Minor injuries will give you a painless contusion, but more severe contusions may stay painful and swollen for a few weeks.  °CAUSES  °A contusion is usually caused by a blow, trauma, or direct force to an area of the body. °SYMPTOMS  °· Swelling and redness of the injured area. °· Bruising of the injured area. °· Tenderness and soreness of the injured area. °· Pain. °DIAGNOSIS  °The diagnosis can be made by taking a history and physical exam. An X-ray, CT scan, or MRI may be needed to determine if there were any associated injuries, such as fractures. °TREATMENT  °Specific treatment will depend on what area of the body was injured. In general, the best treatment for a contusion is resting, icing, elevating, and applying cold compresses to the injured area. Over-the-counter medicines may also be recommended for pain control. Ask your caregiver what the best treatment is for your contusion. °HOME CARE INSTRUCTIONS  °· Put ice on the injured area. °· Put ice in a plastic bag. °· Place a towel between your skin and the bag. °· Leave the ice on for 15-20 minutes, 03-04 times a day. °· Only take over-the-counter or prescription medicines for pain, discomfort, or fever as directed by your caregiver. Your caregiver may recommend avoiding anti-inflammatory medicines (aspirin, ibuprofen, and naproxen) for 48 hours because these medicines may increase bruising. °· Rest the injured area. °· If possible, elevate the injured area to reduce swelling. °SEEK IMMEDIATE MEDICAL CARE IF:  °· You have increased bruising or swelling. °· You have pain that is getting worse. °· Your swelling or pain is not relieved with medicines. °MAKE SURE YOU:  °· Understand these instructions. °· Will watch your condition. °· Will get help right away if you are not doing well or get  worse. °Document Released: 01/14/2005 Document Revised: 06/29/2011 Document Reviewed: 02/09/2011 °ExitCare® Patient Information ©2014 [employer]. °RICE: Routine Care for Injuries °The routine care of many injuries includes Rest, Ice, Compression, and Elevation (RICE). °HOME CARE INSTRUCTIONS °· Rest is needed to allow your body to heal. Routine activities can usually be resumed when comfortable. Injured tendons and bones can take up to 6 weeks to heal. Tendons are the cord-like structures that attach muscle to bone. °· Ice following an injury helps keep the swelling down and reduces pain. °· Put ice in a plastic bag. °· Place a towel between your skin and the bag. °· Leave the ice on for 15-20 minutes, 03-04 times a day. Do this while awake, for the first 24 to 48 hours. After that, continue as directed by your caregiver. °· Compression helps keep swelling down. It also gives support and helps with discomfort. If an elastic bandage has been applied, it should be removed and reapplied every 3 to 4 hours. It should not be applied tightly, but firmly enough to keep swelling down. Watch fingers or toes for swelling, bluish discoloration, coldness, numbness, or excessive pain. If any of these problems occur, remove the bandage and reapply loosely. Contact your caregiver if these problems continue. °· Elevation helps reduce swelling and decreases pain. With extremities, such as the arms, hands, legs, and feet, the injured area should be placed near or above the level of the heart, if possible. °SEEK IMMEDIATE MEDICAL CARE IF: °· You have persistent pain and swelling. °· You develop redness,   numbness, or unexpected weakness. °· Your symptoms are getting worse rather than improving after several days. °These symptoms may indicate that further evaluation or further X-rays are needed. Sometimes, X-rays may not show a small broken bone (fracture) until 1 week or 10 days later. Make a follow-up appointment with your  caregiver. Ask when your X-ray results will be ready. Make sure you get your X-ray results. °Document Released: 07/19/2000 Document Revised: 06/29/2011 Document Reviewed: 09/05/2010 °ExitCare® Patient Information ©2014 [employer]. ° °

## 2013-09-17 NOTE — ED Notes (Signed)
Right hand slammed in door this Am, now painful and swollen, bruising to outside of hand
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
## 2013-10-12 ENCOUNTER — Encounter (HOSPITAL_BASED_OUTPATIENT_CLINIC_OR_DEPARTMENT_OTHER): Payer: Self-pay | Admitting: Emergency Medicine

## 2013-10-12 ENCOUNTER — Emergency Department (HOSPITAL_BASED_OUTPATIENT_CLINIC_OR_DEPARTMENT_OTHER)
Admission: EM | Admit: 2013-10-12 | Discharge: 2013-10-12 | Disposition: A | Discharge status: Home / Self Care | Payer: Medicaid Other | Attending: Emergency Medicine | Admitting: Emergency Medicine

## 2013-10-12 DIAGNOSIS — Z3202 Encounter for pregnancy test, result negative: Secondary | ICD-10-CM | POA: Insufficient documentation

## 2013-10-12 DIAGNOSIS — G894 Chronic pain syndrome: Secondary | ICD-10-CM | POA: Insufficient documentation

## 2013-10-12 DIAGNOSIS — Q762 Congenital spondylolisthesis: Secondary | ICD-10-CM | POA: Insufficient documentation

## 2013-10-12 DIAGNOSIS — M546 Pain in thoracic spine: Secondary | ICD-10-CM | POA: Insufficient documentation

## 2013-10-12 DIAGNOSIS — F411 Generalized anxiety disorder: Secondary | ICD-10-CM | POA: Insufficient documentation

## 2013-10-12 DIAGNOSIS — F329 Major depressive disorder, single episode, unspecified: Secondary | ICD-10-CM | POA: Insufficient documentation

## 2013-10-12 DIAGNOSIS — Z79899 Other long term (current) drug therapy: Secondary | ICD-10-CM | POA: Insufficient documentation

## 2013-10-12 DIAGNOSIS — F17200 Nicotine dependence, unspecified, uncomplicated: Secondary | ICD-10-CM | POA: Insufficient documentation

## 2013-10-12 DIAGNOSIS — F3289 Other specified depressive episodes: Secondary | ICD-10-CM | POA: Insufficient documentation

## 2013-10-12 DIAGNOSIS — Z8739 Personal history of other diseases of the musculoskeletal system and connective tissue: Secondary | ICD-10-CM | POA: Insufficient documentation

## 2013-10-12 DIAGNOSIS — Z87440 Personal history of urinary (tract) infections: Secondary | ICD-10-CM | POA: Insufficient documentation

## 2013-10-12 LAB — URINALYSIS, ROUTINE W REFLEX MICROSCOPIC
Glucose, UA: NEGATIVE mg/dL
Hgb urine dipstick: NEGATIVE
Ketones, ur: 15 mg/dL — AB
Leukocytes, UA: NEGATIVE
Nitrite: NEGATIVE
Protein, ur: NEGATIVE mg/dL
Specific Gravity, Urine: 1.02 (ref 1.005–1.030)
Urobilinogen, UA: 1 mg/dL (ref 0.0–1.0)
pH: 7 (ref 5.0–8.0)

## 2013-10-12 LAB — PREGNANCY, URINE: Preg Test, Ur: NEGATIVE

## 2013-10-12 LAB — URINE MICROSCOPIC-ADD ON

## 2013-10-12 MED ORDER — METHOCARBAMOL 500 MG PO TABS: 500.0000 mg | ORAL_TABLET | Freq: Two times a day (BID) | ORAL | Status: DC

## 2013-10-12 MED ORDER — IBUPROFEN 800 MG PO TABS: 800.0000 mg | ORAL_TABLET | Freq: Three times a day (TID) | ORAL | Status: DC

## 2013-10-12 NOTE — ED Notes (Signed)
Left flank and left lower abd pain started last night

## 2013-10-12 NOTE — Discharge Instructions (Signed)

## 2013-10-12 NOTE — ED Provider Notes (Signed)
CSN: [id number]     Arrival date & time 10/12/13  1329 History   First MD Initiated Contact with Patient 10/12/13 1345     Chief Complaint  Patient presents with  . Flank Pain     (Consider location/radiation/quality/duration/timing/severity/associated sxs/prior Treatment) HPI Comments: Patient presents to the ER for evaluation of left sided low back pain that began last night. Patient reports that the pain has been intermittent. She has not had any nausea, vomiting or fever. She does think that her urine had a slightly different odor. She reports previous history of kidney infection. Symptoms were somewhat similar.  Patient is a 33 y.o. female presenting with flank pain.  Flank Pain    Past Medical History  Diagnosis Date  . Anxiety   . Depression   . Headache(784.0) hemiplegic migraine  . Chondromalacia of patella   . Lumbar spondylolysis   . Low back pain   . Chronic pelvic pain in female   . Chronic pain syndrome   . Spinal stenosis in cervical region   . Congenital spondylolisthesis   . Spinal stenosis in cervical region   . Congenital spondylolisthesis    Past Surgical History  Procedure Laterality Date  . Cesarean section    . Tonsillectomy    . Tubal ligation    . Mouth surgery     No family history on file. History  Substance Use Topics  . Smoking status: Current Every Day Smoker -- 1.00 packs/day    Types: Cigarettes  . Smokeless tobacco: Never Used  . Alcohol Use: No   OB History   Grav Para Term Preterm Abortions TAB SAB Ect Mult Living                 Review of Systems  Genitourinary: Positive for flank pain.  All other systems reviewed and are negative.     Allergies  Eggs or egg-derived products; Gabapentin; Hydrocodone; Toradol; Tramadol; and Metronidazole  Home Medications   Prior to Admission medications   Medication Sig Start Date End Date Taking? Authorizing Provider  ALPRAZolam (XANAX PO) Take by mouth.   Yes Historical Provider,  MD  carisoprodol (SOMA) 350 MG tablet Take 1 tablet (350 mg total) by mouth 3 (three) times daily as needed. 07/31/13   Nestor RampSara L Neal, MD  DULoxetine (CYMBALTA) 30 MG capsule Take 1 capsule (30 mg total) by mouth daily. 02/08/13   Sanjuana LettersWilliam Arthur Hensel, MD  ibuprofen (ADVIL,MOTRIN) 800 MG tablet Take 1 tablet (800 mg total) by mouth 3 (three) times daily. 06/09/13   Glynn OctaveStephen Rancour, MD  ibuprofen (ADVIL,MOTRIN) 800 MG tablet Take 1 tablet (800 mg total) by mouth every 8 (eight) hours as needed for mild pain. 09/17/13   Kristen N Ward, DO  oxyCODONE-acetaminophen (PERCOCET/ROXICET) 5-325 MG per tablet Take 1 tablet by mouth every 4 (four) hours as needed. 09/17/13   Kristen N Ward, DO   BP 138/78  Pulse 66  Temp(Src) 97.8 F (36.6 C) (Oral)  Resp 16  Ht 5\' 4"  (1.626 m)  Wt 120 lb (54.432 kg)  BMI 20.59 kg/m2  SpO2 100%  LMP 10/06/2013 Physical Exam  Constitutional: She is oriented to person, place, and time. She appears well-developed and well-nourished. No distress.  HENT:  Head: Normocephalic and atraumatic.  Right Ear: Hearing normal.  Left Ear: Hearing normal.  Nose: Nose normal.  Mouth/Throat: Oropharynx is clear and moist and mucous membranes are normal.  Eyes: Conjunctivae and EOM are normal. Pupils are equal, round, and reactive to  light.  Neck: Normal range of motion. Neck supple.  Cardiovascular: Regular rhythm, S1 normal and S2 normal.  Exam reveals no gallop and no friction rub.   No murmur heard. Pulmonary/Chest: Effort normal and breath sounds normal. No respiratory distress. She exhibits no tenderness.  Abdominal: Soft. Normal appearance and bowel sounds are normal. There is no hepatosplenomegaly. There is no tenderness. There is no rebound, no guarding, no CVA tenderness, no tenderness at McBurney's point and negative Murphy's sign. No hernia.  Musculoskeletal: Normal range of motion.       Back:  Neurological: She is alert and oriented to person, place, and time. She has  normal strength. No cranial nerve deficit or sensory deficit. Coordination normal. GCS eye subscore is 4. GCS verbal subscore is 5. GCS motor subscore is 6.  Skin: Skin is warm, dry and intact. No rash noted. No cyanosis.  Psychiatric: She has a normal mood and affect. Her speech is normal and behavior is normal. Thought content normal.    ED Course  Procedures (including critical care time) Labs Review Labs Reviewed  PREGNANCY, URINE  URINALYSIS, ROUTINE W REFLEX MICROSCOPIC    Imaging Review No results found.   EKG Interpretation None      MDM   Final diagnoses:  None  Back Pain  Work up unremarkable. Patient's symptoms are more consistent with musculoskeletal pain. The pain is much higher than the CVA region. Patient has worsening pain with bending and twisting consistent with musculoskeletal pain. Lab work unremarkable. Urinalysis does not suggest infection.    Gilda Creasehristopher J. Pollina, MD 10/12/13 [phone]

## 2013-10-12 NOTE — ED Notes (Signed)
MD at bedside. 

## 2013-11-15 ENCOUNTER — Encounter (HOSPITAL_BASED_OUTPATIENT_CLINIC_OR_DEPARTMENT_OTHER): Payer: Self-pay | Admitting: Emergency Medicine

## 2013-11-15 ENCOUNTER — Emergency Department (HOSPITAL_BASED_OUTPATIENT_CLINIC_OR_DEPARTMENT_OTHER)
Admission: EM | Admit: 2013-11-15 | Discharge: 2013-11-15 | Disposition: A | Discharge status: Home / Self Care | Payer: Medicaid Other | Attending: Emergency Medicine | Admitting: Emergency Medicine

## 2013-11-15 DIAGNOSIS — R52 Pain, unspecified: Secondary | ICD-10-CM | POA: Diagnosis not present

## 2013-11-15 DIAGNOSIS — G8929 Other chronic pain: Secondary | ICD-10-CM | POA: Insufficient documentation

## 2013-11-15 DIAGNOSIS — X500XXA Overexertion from strenuous movement or load, initial encounter: Secondary | ICD-10-CM | POA: Diagnosis not present

## 2013-11-15 DIAGNOSIS — F3289 Other specified depressive episodes: Secondary | ICD-10-CM | POA: Insufficient documentation

## 2013-11-15 DIAGNOSIS — Z8742 Personal history of other diseases of the female genital tract: Secondary | ICD-10-CM | POA: Diagnosis not present

## 2013-11-15 DIAGNOSIS — F17200 Nicotine dependence, unspecified, uncomplicated: Secondary | ICD-10-CM | POA: Insufficient documentation

## 2013-11-15 DIAGNOSIS — F329 Major depressive disorder, single episode, unspecified: Secondary | ICD-10-CM | POA: Diagnosis not present

## 2013-11-15 DIAGNOSIS — Z791 Long term (current) use of non-steroidal anti-inflammatories (NSAID): Secondary | ICD-10-CM | POA: Diagnosis not present

## 2013-11-15 DIAGNOSIS — Z79899 Other long term (current) drug therapy: Secondary | ICD-10-CM | POA: Insufficient documentation

## 2013-11-15 DIAGNOSIS — Y9289 Other specified places as the place of occurrence of the external cause: Secondary | ICD-10-CM | POA: Insufficient documentation

## 2013-11-15 DIAGNOSIS — IMO0002 Reserved for concepts with insufficient information to code with codable children: Secondary | ICD-10-CM | POA: Insufficient documentation

## 2013-11-15 DIAGNOSIS — Z8739 Personal history of other diseases of the musculoskeletal system and connective tissue: Secondary | ICD-10-CM | POA: Insufficient documentation

## 2013-11-15 DIAGNOSIS — F411 Generalized anxiety disorder: Secondary | ICD-10-CM | POA: Insufficient documentation

## 2013-11-15 DIAGNOSIS — Y9389 Activity, other specified: Secondary | ICD-10-CM | POA: Insufficient documentation

## 2013-11-15 DIAGNOSIS — S335XXA Sprain of ligaments of lumbar spine, initial encounter: Secondary | ICD-10-CM | POA: Diagnosis not present

## 2013-11-15 DIAGNOSIS — Q762 Congenital spondylolisthesis: Secondary | ICD-10-CM | POA: Insufficient documentation

## 2013-11-15 DIAGNOSIS — S0993XA Unspecified injury of face, initial encounter: Secondary | ICD-10-CM | POA: Insufficient documentation

## 2013-11-15 DIAGNOSIS — S199XXA Unspecified injury of neck, initial encounter: Secondary | ICD-10-CM

## 2013-11-15 MED ORDER — METHOCARBAMOL 500 MG PO TABS: 500.0000 mg | ORAL_TABLET | Freq: Two times a day (BID) | ORAL | Status: DC

## 2013-11-15 MED ORDER — IBUPROFEN 800 MG PO TABS: 800.0000 mg | ORAL_TABLET | Freq: Three times a day (TID) | ORAL | Status: DC | PRN

## 2013-11-15 MED ORDER — OXYCODONE-ACETAMINOPHEN 5-325 MG PO TABS
2.0000 | ORAL_TABLET | Freq: Once | ORAL | Status: AC
  Administered 2013-11-15: 2 via ORAL
  Filled 2013-11-15: qty 2

## 2013-11-15 NOTE — ED Notes (Signed)
Patient states she was doing "tree work" and dropped a limb, which "jerked" her back and she is now having lower back pain.

## 2013-11-15 NOTE — ED Notes (Signed)
Patient asked to change into a gown.  

## 2013-11-15 NOTE — ED Provider Notes (Signed)
Pt seen and evaluated.  History of chronic back pain.  Exacerbation today after moving tree limbs yesterday.  Intact on exam without focal neuro loss. I discussed ER role in treatment of chronic pain.  Recommended to avoid lumberjacking in face of chronic back pain requiring narcotic treatment.  Normal symmetric Strength to shoulder shrug, triceps, biceps, grip,wrist flex/extend,and intrinsics  Norma lsymmetric sensation above and below clavicles, and to all distributions to UEs. Norma symmetric strength to flex/.extend hip and knees, dorsi/plantar flex ankles. Normal symmetric sensation to all distributions to LEs Patellar and achilles reflexes 1-2+. Downgoing Babinski     Kiara PorterMark Jvion Turgeon, MD 11/15/13 [phone]

## 2013-11-15 NOTE — ED Provider Notes (Signed)
CSN: [id number]     Arrival date & time 11/15/13  1243 History   First MD Initiated Contact with Patient 11/15/13 1314     Chief Complaint  Patient presents with  . Back Pain     (Consider location/radiation/quality/duration/timing/severity/associated sxs/prior Treatment) HPI Kiara Murillo is a 33 year old female with a past medical history of lumbar spondylolysis, congenital spondylolisthesis, chronic lower back pain, chronic pain syndrome who presents today with a one-day history of back pain after being "jerked by a limb" while helping a friend cut down a tree. Patient states she was holding a rope which was connected to a large limb, when the limb was dropped. This resulted in her being "jerked forward". She denies falling. She reports the pain as being 8/10 and is made worse with movement. She states sitting still the pain is not as bad, however walking or doing any activity exacerbates it. Patient reports having some weakness in her left leg that is chronic and denies having a new weakness. Patient denies any numbness, tingling, urinary incontinence.  Past Medical History  Diagnosis Date  . Anxiety   . Depression   . Headache(784.0) hemiplegic migraine  . Chondromalacia of patella   . Lumbar spondylolysis   . Low back pain   . Chronic pelvic pain in female   . Chronic pain syndrome   . Spinal stenosis in cervical region   . Congenital spondylolisthesis   . Spinal stenosis in cervical region   . Congenital spondylolisthesis    Past Surgical History  Procedure Laterality Date  . Cesarean section    . Tonsillectomy    . Tubal ligation    . Mouth surgery     No family history on file. History  Substance Use Topics  . Smoking status: Current Every Day Smoker -- 1.00 packs/day    Types: Cigarettes  . Smokeless tobacco: Never Used  . Alcohol Use: No   OB History   Grav Para Term Preterm Abortions TAB SAB Ect Mult Living                 Review of Systems  Constitutional:  Negative for chills and fatigue.  HENT: Negative.   Eyes: Negative.   Respiratory: Negative.  Negative for shortness of breath.   Cardiovascular: Negative.   Gastrointestinal: Negative.   Genitourinary: Negative for difficulty urinating.  Musculoskeletal: Positive for back pain and neck pain. Negative for arthralgias, gait problem and neck stiffness.  Skin: Negative for rash.  Neurological: Negative for dizziness, weakness, numbness and headaches.  Psychiatric/Behavioral: Negative.       Allergies  Eggs or egg-derived products; Gabapentin; Hydrocodone; Toradol; Tramadol; and Metronidazole  Home Medications   Prior to Admission medications   Medication Sig Start Date End Date Taking? Authorizing Provider  ALPRAZolam (XANAX PO) Take by mouth.    Historical Provider, MD  carisoprodol (SOMA) 350 MG tablet Take 1 tablet (350 mg total) by mouth 3 (three) times daily as needed. 07/31/13   Nestor Ramp, MD  DULoxetine (CYMBALTA) 30 MG capsule Take 1 capsule (30 mg total) by mouth daily. 02/08/13   Sanjuana Letters, MD  ibuprofen (ADVIL,MOTRIN) 800 MG tablet Take 1 tablet (800 mg total) by mouth 3 (three) times daily. 06/09/13   Glynn Octave, MD  ibuprofen (ADVIL,MOTRIN) 800 MG tablet Take 1 tablet (800 mg total) by mouth 3 (three) times daily. 10/12/13   Gilda Crease, MD  ibuprofen (ADVIL,MOTRIN) 800 MG tablet Take 1 tablet (800 mg total) by mouth every  8 (eight) hours as needed for mild pain. 11/15/13   Monte FantasiaJoseph W Roxy Filler, PA-C  methocarbamol (ROBAXIN) 500 MG tablet Take 1 tablet (500 mg total) by mouth 2 (two) times daily. 11/15/13   Monte FantasiaJoseph W Quenisha Lovins, PA-C  oxyCODONE-acetaminophen (PERCOCET/ROXICET) 5-325 MG per tablet Take 1 tablet by mouth every 4 (four) hours as needed. 09/17/13   Kristen N Ward, DO   BP 139/80  Pulse 88  Temp(Src) 98.5 F (36.9 C) (Oral)  Resp 16  Ht 5\' 4"  (1.626 m)  Wt 120 lb (54.432 kg)  BMI 20.59 kg/m2  SpO2 100% Physical Exam  Constitutional: She is  oriented to person, place, and time. She appears well-developed and well-nourished. No distress.  HENT:  Head: Normocephalic and atraumatic.  Eyes: Pupils are equal, round, and reactive to light. No scleral icterus.  Neck: Normal range of motion. Spinous process tenderness and muscular tenderness present. No rigidity. No edema and no erythema present.  Pt has chronic neck pain with spinous process tenderness which she reports is typical for her.  Pt's new pain is on the lateral aspect of her neck bilaterally.    Musculoskeletal:  Spinous processes intact without obvious deformity, step-off.  Pt has tenderness in the L lateral lumbar, L lateral sacral region which is reproducible and tender to palpation.    Neurological: She is alert and oriented to person, place, and time. She has normal strength. No sensory deficit. GCS eye subscore is 4. GCS verbal subscore is 5. GCS motor subscore is 6.  Reflex Scores:      Patellar reflexes are 2+ on the right side and 2+ on the left side.      Achilles reflexes are 2+ on the right side and 2+ on the left side. Motor strength 5/5 in all major muscle groups.  Negative leg raise bilaterally.  Proximal/distal sensation intact.    Skin: Skin is warm, dry and intact. She is not diaphoretic.  Psychiatric: She has a normal mood and affect. Her speech is normal.    ED Course  Procedures (including critical care time) Labs Review Labs Reviewed - No data to display  Imaging Review No results found.   EKG Interpretation None      MDM   Final diagnoses:  Lumbar back sprain, initial encounter    Based on a history of one day of new back pain after an incident which potentially have strained patient's back, Initially on the patient's differential diagnoses were musculoskeletal sprain/strain, acute on chronic spondylolysis, cauda equina syndrome. The fact that the patient denied any urinary incontinence, new weakness, sensory deficit, ataxia made the  diagnosis of cauda equina syndrome less likely. During physical exam it was noted that patient has 5 out of 5 motor strength in all major muscle groups and she is moving freely in the bed and around the room throughout the visit without distress. Patient has negative leg raise bilaterally and has no evidence of motor or sensory deficit. Patient localizes the acute pain today as being in different locations as her chronic pain which makes less likely the differential of and acute on chronic spondylolysis. Based on patient's physical exam, the low mechanism injury, and the description of patient's pain today and patient's past medical history of lumbar spondylolisthesis, and chronic low back pain,  it was decided that imaging would not provide any diagnostic benefit at this point.    1532: Patient reports oxycodone helped ease her pain. Patient is walking freely around the room in no distress.  1553: Patient reports he still has mild pain, however is improved after the oxycodone. Patient expressed interest in finding a new PCP, and we provided her with information on several different resources in the area. Patient is discharged with a prescription for ibuprofen 800 mg 3 times daily and Robaxin 500 mg twice daily and was instructed to followup with her PCP in approximately one week for her muscle strain as well as for her chronic pain. Patient was encouraged to return to the ER should she have any concerns or should she wish to have further evaluation.   Signed,  Ladona Mow, PA-C [address]   Monte Fantasia, [place] 11/15/13 2333

## 2013-11-15 NOTE — Discharge Instructions (Signed)
Back Pain, Adult °Low back pain is very common. About 1 in 5 people have back pain. The cause of low back pain is rarely dangerous. The pain often gets better over time. About half of people with a sudden onset of back pain feel better in just 2 weeks. About 8 in 10 people feel better by 6 weeks.  °CAUSES °Some common causes of back pain include: °· Strain of the muscles or ligaments supporting the spine. °· Wear and tear (degeneration) of the spinal discs. °· Arthritis. °· Direct injury to the back. °DIAGNOSIS °Most of the time, the direct cause of low back pain is not known. However, back pain can be treated effectively even when the exact cause of the pain is unknown. Answering your caregiver's questions about your overall health and symptoms is one of the most accurate ways to make sure the cause of your pain is not dangerous. If your caregiver needs more information, he or she may order lab work or imaging tests (X-rays or MRIs). However, even if imaging tests show changes in your back, this usually does not require surgery. °HOME CARE INSTRUCTIONS °For many people, back pain returns. Since low back pain is rarely dangerous, it is often a condition that people can learn to manage on their own.  °· Remain active. It is stressful on the back to sit or stand in one place. Do not sit, drive, or stand in one place for more than 30 minutes at a time. Take short walks on level surfaces as soon as pain allows. Try to increase the length of time you walk each day. °· Do not stay in bed. Resting more than 1 or 2 days can delay your recovery. °· Do not avoid exercise or work. Your body is made to move. It is not dangerous to be active, even though your back may hurt. Your back will likely heal faster if you return to being active before your pain is gone. °· Pay attention to your body when you  bend and lift. Many people have less discomfort when lifting if they bend their knees, keep the load close to their bodies, and  avoid twisting. Often, the most comfortable positions are those that put less stress on your recovering back. °· Find a comfortable position to sleep. Use a firm mattress and lie on your side with your knees slightly bent. If you lie on your back, put a pillow under your knees. °· Only take over-the-counter or prescription medicines as directed by your caregiver. Over-the-counter medicines to reduce pain and inflammation are often the most helpful. Your caregiver may prescribe muscle relaxant drugs. These medicines help dull your pain so you can more quickly return to your normal activities and healthy exercise. °· Put ice on the injured area. °¨ Put ice in a plastic bag. °¨ Place a towel between your skin and the bag. °¨ Leave the ice on for 15-20 minutes, 03-04 times a day for the first 2 to 3 days. After that, ice and heat may be alternated to reduce pain and spasms. °· Ask your caregiver about trying back exercises and gentle massage. This may be of some benefit. °· Avoid feeling anxious or stressed. Stress increases muscle tension and can worsen back pain. It is important to recognize when you are anxious or stressed and learn ways to manage it. Exercise is a great option. °SEEK MEDICAL CARE IF: °· You have pain that is not relieved with rest or medicine. °· You have pain that does not improve in 1 week. °· You have new symptoms. °· You are generally not feeling well. °SEEK   IMMEDIATE MEDICAL CARE IF:  °· You have pain that radiates from your back into your legs. °· You develop new bowel or bladder control problems. °· You have unusual weakness or numbness in your arms or legs. °· You develop nausea or vomiting. °· You develop abdominal pain. °· You feel faint. °Document Released: 04/06/2005 Document Revised: 10/06/2011 Document Reviewed: 08/08/2013 °ExitCare® Patient Information ©2015 [employer]. This information is not intended to replace advice given to you by your health care provider. Make sure you  discuss any questions you have with your health care provider. ° ° ° ° °Emergency Department Resource Guide °1) Find a Doctor and Pay Out of Pocket °Although you won't have to find out who is covered by your insurance plan, it is a good idea to ask around and get recommendations. You will then need to call the office and see if the doctor you have chosen will accept you as a new patient and what types of options they offer for patients who are self-pay. Some doctors offer discounts or will set up payment plans for their patients who do not have insurance, but you will need to ask so you aren't surprised when you get to your appointment. ° °2) Contact Your Local Health Department °Not all health departments have doctors that can see patients for sick visits, but many do, so it is worth a call to see if yours does. If you don't know where your local health department is, you can check in your phone book. The CDC also has a tool to help you locate your state's health department, and many state websites also have listings of all of their local health departments. ° °3) Find a Walk-in Clinic °If your illness is not likely to be very severe or complicated, you may want to try a walk in clinic. These are popping up all over the country in pharmacies, drugstores, and shopping centers. They're usually staffed by nurse practitioners or physician assistants that have been trained to treat common illnesses and complaints. They're usually fairly quick and inexpensive. However, if you have serious medical issues or chronic medical problems, these are probably not your best option. ° °No Primary Care Doctor: °- Call [facility] at  832-8000 - they can help you locate a primary care doctor that  accepts your insurance, provides certain services, etc. °- Physician Referral Service- 1-800-533-3463 ° °Chronic Pain Problems: °Organization         Address  Phone   Notes  °[facility]  (336) 297-2271 Patients need  to be referred by their primary care doctor.  ° °Medication Assistance: °Organization         Address  Phone   Notes  °[place] [facility] [address] °[place], [place] [address] (336) 641-8030 --Must be a resident of [place] °-- Must have NO insurance coverage whatsoever (no Medicaid/ Medicare, etc.) °-- The pt. MUST have a primary care doctor that directs their care regularly and follows them in the community °  °[facility]  (866) 331-1348   °[employer]  (888) 892-1162   ° °Agencies that provide inexpensive medical care: °Organization         Address  Phone   Notes  °[facility]  (336) 832-8035   °[facility]    (336) 832-7272   °[facility] [address] °[place], [place] [address] (336) 832-4777   °[facility] [address], °[place] (336) 271-4999   °[facility]    (336) 373-0678   °[facility]    (336) 272-1050   °[facility] ° [address], [place] Phone:  (336) 832-4444, Fax:  (336) 832-4440 Hours of Operation:  9 am - 6 pm, M-F.  Also accepts Medicaid/Medicare and self-pay.  ° [facility] ° [address], [place] Phone: (336) 832-3150, Fax: (336) 832-3151. Hours of Operation:  8:30 am - 5:30 pm, M-F.  Also accepts Medicaid and self-pay.  °[facility] [place] [address], [place] Phone: (336) 878-6027   °[facility] [address], [place], [place] (336)723-1848, Ext. 123 Mondays & Thursdays: 7-9 AM.  First 15 patients are seen on a first come, first serve basis. °  ° °Medicaid-accepting [place] Providers: ° °Organization         Address  Phone   Notes  °[facility] [address], [place] (336) 641-2100 Also accepts self-pay patients.  °[facility] [address], [place] ° (336) 856-9996   °[facility] [address], [place] (336) 288-8857   °[facility] [address] [place] [address], [place] (336) 299-7000   °Veita Bland [address], [place]  ° (336) 373-1557 Only accepts [place] Access Medicaid patients after they have their name applied to their card.  ° °Self-Pay (no insurance) in [place]: ° °Organization         Address  Phone   Notes  °Sickle Cell Patients, [facility] [address], [place] (336) 832-1970   °[facility] [address], [place] (336) 832-4400   °[facility] ° [address], [place] (336) 992-4800   °[facility]/Dr. Osei-Bonsu ° [address] [place] [address], [place] or [address], [place] (336) 841-8500 Phone number for both [place] and [place] locations is the same.  °[facility] [address], [place] (336) 299-0000   °[facility] [address] [place] [address], [place] or [address] (336) 852-7530 °(336) 878-2260   °[facility] [address], [place] (336) 350-1642, phone; (336) 294-5005, fax Sees patients 1st and 3rd Saturday of every month.  Must not qualify for public or private insurance (i.e. Medicaid, Medicare, [place] Health Choice, Veterans' Benefits) • Household income should be no more than 200% of the poverty level •The clinic cannot treat you if you are pregnant or think you are pregnant • Sexually transmitted diseases are not treated at the clinic.  ° ° °Dental Care: °Organization         Address  Phone  Notes  °[facility] [address], [place] (336) 641-6152 Accepts children up to age 21 who are enrolled in Medicaid or [place] Health Choice; pregnant women with a Medicaid card; and children who have applied for Medicaid or [place] Health Choice, but were declined, whose  parents can pay a reduced fee at time of service.  °[facility] [place]  [address], [place] (336) 641-7733 Accepts children up to age 21 who are enrolled in Medicaid or [place] Health Choice; pregnant women with a Medicaid card; and children who have applied for Medicaid   or [place] Health Choice, but were declined, whose parents can pay a reduced fee at time of service.  °[facility] ° [address], [place] (336) 641-4533 Patients are seen by appointment only. Walk-ins are not accepted. [facility] will see patients 18 years of age and older. °Monday - Tuesday (8am-5pm) °Most Wednesdays (8:30-5pm) °$30 per visit, cash only  °[facility] ° [address], [place] (336) 641-4533 Patients are seen by appointment only. Walk-ins are not accepted. [facility] will see patients 18 years of age and older. °One Wednesday Evening (Monthly: Volunteer Based).  $30 per visit, cash only  °[facility]  (919) 537-3737 for adults; Children under age 4, call Graduate Pediatric Dentistry at (919) [phone]. Children aged 4-14, please call (919) [phone] to request a pediatric application. ° Dental services are provided in all areas of dental care including fillings, crowns and bridges, complete and partial dentures, implants, gum treatment, root canals, and extractions. Preventive care is also provided. Treatment is provided to both adults and children. °Patients are selected via a lottery and there is often a waiting list. °  °[facility] [address], °[place] ° (336) 763-8833 [email or web address] °  °[facility] [address], [place], [place] (336)723-1848, Ext. 123 Second and Fourth Thursday of each month, opens at 6:30 AM; Clinic ends at 9 AM.  Patients are seen on a first-come first-served basis, and a limited number are seen during each clinic.  ° °[facility] °  [address], [place], [place] (336) 723-7904   Eligibility Requirements °You must have lived in [place], [place], or [place] counties for at least the last three months. °  You cannot be eligible for state or federal sponsored healthcare insurance, including Veterans Administration, Medicaid, or Medicare. °  You generally cannot be eligible for healthcare insurance through your employer.  °  How to apply: °Eligibility screenings are held every Tuesday and Wednesday afternoon from 1:00 pm until 4:00 pm. You do not need an appointment for the interview!  °[facility] [address], [place], [place] 336-631-2330   °[facility]  336-342-8273   °[facility]  336-703-3100   °[facility]  336-570-6415   ° °Behavioral Health Resources in the Community: °Intensive Outpatient Programs °Organization         Address  Phone  Notes  °[facility] [address], [place], [place] 336-878-6098   °[facility] [address], [place], [place] 336-832-9800   °[facility] [address], [place], [place] ° 336-882-2125   °[facility] [address],  °[place],  1-800-853-5163 or 336-641-4981   °Substance Abuse Resources °Organization         Address  Phone  Notes  °[facility]  336-882-2125   °[facility]  336-784-9470   °[facility]  336-285-9073   °[facility]  336-845-3988   °Residential & Outpatient Substance Abuse Program  1-800-659-3381   °Psychological Services °Organization         Address  Phone  Notes  °[facility]  336- 832-9600   °[facility]  336- 378-7881   °[facility] [address], [place] 1-800-853-5163 or 336-641-4981   ° °Mobile Crisis Teams °Organization         Address  Phone    Notes  °[facility], Mobile Crisis Care Unit  1-877-626-1772     °Assertive °[facility] ° [address] [place], [place] 336-834-9664   °Sharon DeEsch [address] °[place] 336-554-5454   ° °Self-Help/Support Groups °Organization         Address  Phone             Notes  °[facility] - variety of support groups  336- 373-1402 Call for more information  °Narcotics Anonymous (NA), [facility] [address], °[place]  2 meetings at this location  ° °Residential Treatment Programs °Organization         Address  Phone  Notes  °[facility] [address],    °[place]  1-866-801-8205   °[facility] ° [address], [place], [place] 704-293-8524   °[facility] [address], [place] 336-845-3988 Admissions: 8am-3pm M-F  °[facility] [address]    °[place], [place] 336-841-1104   °[facility] [address], [place], [place] 336-379-7146   °[facility] [address]  °[place], [place] 336-285-9073   °[facility] - Intensive Outpatient [address], [place], [place] 336-852-3033   °[facility] [address]  °[place], [place] 1-877-615-2722 or 336-784-9470   °[facility] [address] [place], [place] 336-227-7417 Accepts Medicaid  °[facility] [address]  °[place] 1-800-659-3381 Substance Abuse/Addiction Treatment  ° °[place] Behavioral Health Resources °Organization         Address  Phone  Notes  °[facility]  (888) 581-9988   °Julie Brannon, PhD [address] [place], [place]   (336) 349-5553 or (336) 951-0000   °[facility]   [address] °[place], [place] (336) 349-4454   °[facility] [address], [place], [place] (336) 342-8316 Insurance/Medicaid/sponsorship through [facility]  °[facility] [address]                                     [place], [place] (336) 342-8316 Therapy/tele-psych/case  °[facility] [address]  ° [place], [place] (336) 349-2233    °Dr. Arfeen  (336) 349-4544   °[facility] [place]  [employer] [place] [facility] 1) [address], [place] °2) [address], [place] °3)  [address], [place] (336) 349-3220 °(336) 342-7768 ° °(336) 342-8140   °[place] [facility] (336) 342-1394 or (336) 342-3537 (After Hours)    ° ° ° ° °

## 2013-11-21 NOTE — ED Provider Notes (Signed)
Medical screening examination/treatment/procedure(s) were conducted as a shared visit with non-physician practitioner(s) and myself.  I personally evaluated the patient during the encounter.   EKG Interpretation None      Pt seen and examined.  History reviewed.  No PE findings to suggest Acute HNP.  No deficits on exam.  I discussed workup with PA Rexanne ManoMintz.  I agree with his assessment and outlined plan.    Rolland PorterMark Linville Decarolis, MD 11/21/13 [phone]

## 2014-02-18 ENCOUNTER — Encounter (HOSPITAL_BASED_OUTPATIENT_CLINIC_OR_DEPARTMENT_OTHER): Payer: Self-pay

## 2014-02-18 ENCOUNTER — Emergency Department (HOSPITAL_BASED_OUTPATIENT_CLINIC_OR_DEPARTMENT_OTHER)
Admission: EM | Admit: 2014-02-18 | Discharge: 2014-02-18 | Disposition: A | Discharge status: Home / Self Care | Payer: Medicaid Other | Attending: Emergency Medicine | Admitting: Emergency Medicine

## 2014-02-18 ENCOUNTER — Emergency Department (HOSPITAL_BASED_OUTPATIENT_CLINIC_OR_DEPARTMENT_OTHER): Payer: Medicaid Other

## 2014-02-18 DIAGNOSIS — S61512A Laceration without foreign body of left wrist, initial encounter: Secondary | ICD-10-CM | POA: Insufficient documentation

## 2014-02-18 DIAGNOSIS — F419 Anxiety disorder, unspecified: Secondary | ICD-10-CM | POA: Insufficient documentation

## 2014-02-18 DIAGNOSIS — G43409 Hemiplegic migraine, not intractable, without status migrainosus: Secondary | ICD-10-CM | POA: Insufficient documentation

## 2014-02-18 DIAGNOSIS — S60212A Contusion of left wrist, initial encounter: Secondary | ICD-10-CM | POA: Diagnosis not present

## 2014-02-18 DIAGNOSIS — Z79899 Other long term (current) drug therapy: Secondary | ICD-10-CM | POA: Insufficient documentation

## 2014-02-18 DIAGNOSIS — G894 Chronic pain syndrome: Secondary | ICD-10-CM | POA: Diagnosis not present

## 2014-02-18 DIAGNOSIS — Z8739 Personal history of other diseases of the musculoskeletal system and connective tissue: Secondary | ICD-10-CM | POA: Insufficient documentation

## 2014-02-18 DIAGNOSIS — Q762 Congenital spondylolisthesis: Secondary | ICD-10-CM | POA: Diagnosis not present

## 2014-02-18 DIAGNOSIS — W25XXXA Contact with sharp glass, initial encounter: Secondary | ICD-10-CM | POA: Insufficient documentation

## 2014-02-18 DIAGNOSIS — S6992XA Unspecified injury of left wrist, hand and finger(s), initial encounter: Secondary | ICD-10-CM | POA: Diagnosis present

## 2014-02-18 DIAGNOSIS — F329 Major depressive disorder, single episode, unspecified: Secondary | ICD-10-CM | POA: Insufficient documentation

## 2014-02-18 DIAGNOSIS — Z791 Long term (current) use of non-steroidal anti-inflammatories (NSAID): Secondary | ICD-10-CM | POA: Diagnosis not present

## 2014-02-18 DIAGNOSIS — S50812A Abrasion of left forearm, initial encounter: Secondary | ICD-10-CM | POA: Diagnosis not present

## 2014-02-18 DIAGNOSIS — W228XXA Striking against or struck by other objects, initial encounter: Secondary | ICD-10-CM | POA: Insufficient documentation

## 2014-02-18 DIAGNOSIS — Z720 Tobacco use: Secondary | ICD-10-CM | POA: Diagnosis not present

## 2014-02-18 DIAGNOSIS — R52 Pain, unspecified: Secondary | ICD-10-CM

## 2014-02-18 MED ORDER — OXYCODONE-ACETAMINOPHEN 5-325 MG PO TABS: 1.0000 | ORAL_TABLET | ORAL | Status: DC | PRN

## 2014-02-18 MED ORDER — BACITRACIN 500 UNIT/GM EX OINT
1.0000 "application " | TOPICAL_OINTMENT | Freq: Two times a day (BID) | CUTANEOUS | Status: DC
  Filled 2014-02-18: qty 0.9
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
## 2014-02-18 NOTE — Discharge Instructions (Signed)
Clean the wounds and apply neosporin ointment. Wear the splint for comfort, apply ice, elevate and follow up with Dr. Pearletha ForgeHudnall if symptoms persist. Do not take the narcotic if you are driving as it will make you sleepy. Return here as needed for problems.

## 2014-06-05 ENCOUNTER — Emergency Department (HOSPITAL_BASED_OUTPATIENT_CLINIC_OR_DEPARTMENT_OTHER): Payer: Medicaid Other

## 2014-06-05 ENCOUNTER — Emergency Department (HOSPITAL_BASED_OUTPATIENT_CLINIC_OR_DEPARTMENT_OTHER)
Admission: EM | Admit: 2014-06-05 | Discharge: 2014-06-05 | Disposition: A | Discharge status: Home / Self Care | Payer: Medicaid Other | Attending: Emergency Medicine | Admitting: Emergency Medicine

## 2014-06-05 ENCOUNTER — Encounter (HOSPITAL_BASED_OUTPATIENT_CLINIC_OR_DEPARTMENT_OTHER): Payer: Self-pay | Admitting: *Deleted

## 2014-06-05 DIAGNOSIS — F419 Anxiety disorder, unspecified: Secondary | ICD-10-CM | POA: Diagnosis not present

## 2014-06-05 DIAGNOSIS — S139XXA Sprain of joints and ligaments of unspecified parts of neck, initial encounter: Secondary | ICD-10-CM

## 2014-06-05 DIAGNOSIS — S239XXA Sprain of unspecified parts of thorax, initial encounter: Secondary | ICD-10-CM

## 2014-06-05 DIAGNOSIS — Z8739 Personal history of other diseases of the musculoskeletal system and connective tissue: Secondary | ICD-10-CM | POA: Insufficient documentation

## 2014-06-05 DIAGNOSIS — S233XXA Sprain of ligaments of thoracic spine, initial encounter: Secondary | ICD-10-CM | POA: Insufficient documentation

## 2014-06-05 DIAGNOSIS — Y998 Other external cause status: Secondary | ICD-10-CM | POA: Insufficient documentation

## 2014-06-05 DIAGNOSIS — F329 Major depressive disorder, single episode, unspecified: Secondary | ICD-10-CM | POA: Insufficient documentation

## 2014-06-05 DIAGNOSIS — Y9389 Activity, other specified: Secondary | ICD-10-CM | POA: Insufficient documentation

## 2014-06-05 DIAGNOSIS — Z720 Tobacco use: Secondary | ICD-10-CM | POA: Diagnosis not present

## 2014-06-05 DIAGNOSIS — M25511 Pain in right shoulder: Secondary | ICD-10-CM

## 2014-06-05 DIAGNOSIS — Z79899 Other long term (current) drug therapy: Secondary | ICD-10-CM | POA: Insufficient documentation

## 2014-06-05 DIAGNOSIS — Y92410 Unspecified street and highway as the place of occurrence of the external cause: Secondary | ICD-10-CM | POA: Diagnosis not present

## 2014-06-05 DIAGNOSIS — G8929 Other chronic pain: Secondary | ICD-10-CM | POA: Diagnosis not present

## 2014-06-05 DIAGNOSIS — S161XXA Strain of muscle, fascia and tendon at neck level, initial encounter: Secondary | ICD-10-CM | POA: Insufficient documentation

## 2014-06-05 DIAGNOSIS — Z8776 Personal history of (corrected) congenital malformations of integument, limbs and musculoskeletal system: Secondary | ICD-10-CM | POA: Insufficient documentation

## 2014-06-05 DIAGNOSIS — S199XXA Unspecified injury of neck, initial encounter: Secondary | ICD-10-CM | POA: Diagnosis present

## 2014-06-05 MED ORDER — IBUPROFEN 800 MG PO TABS
800.0000 mg | ORAL_TABLET | Freq: Once | ORAL | Status: AC
  Administered 2014-06-05: 800 mg via ORAL
  Filled 2014-06-05: qty 1

## 2014-06-05 MED ORDER — CYCLOBENZAPRINE HCL 10 MG PO TABS: 10.0000 mg | ORAL_TABLET | Freq: Two times a day (BID) | ORAL | Status: DC | PRN

## 2014-06-05 MED ORDER — CYCLOBENZAPRINE HCL 10 MG PO TABS
10.0000 mg | ORAL_TABLET | Freq: Once | ORAL | Status: AC
  Administered 2014-06-05: 10 mg via ORAL
  Filled 2014-06-05: qty 1
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
## 2015-03-10 ENCOUNTER — Emergency Department (HOSPITAL_BASED_OUTPATIENT_CLINIC_OR_DEPARTMENT_OTHER)
Admission: EM | Admit: 2015-03-10 | Discharge: 2015-03-10 | Disposition: A | Discharge status: Home / Self Care | Payer: Medicaid Other | Attending: Emergency Medicine | Admitting: Emergency Medicine

## 2015-03-10 ENCOUNTER — Encounter (HOSPITAL_BASED_OUTPATIENT_CLINIC_OR_DEPARTMENT_OTHER): Payer: Self-pay | Admitting: *Deleted

## 2015-03-10 DIAGNOSIS — K0889 Other specified disorders of teeth and supporting structures: Secondary | ICD-10-CM | POA: Diagnosis not present

## 2015-03-10 DIAGNOSIS — G8929 Other chronic pain: Secondary | ICD-10-CM | POA: Insufficient documentation

## 2015-03-10 DIAGNOSIS — F419 Anxiety disorder, unspecified: Secondary | ICD-10-CM | POA: Insufficient documentation

## 2015-03-10 DIAGNOSIS — F17210 Nicotine dependence, cigarettes, uncomplicated: Secondary | ICD-10-CM | POA: Insufficient documentation

## 2015-03-10 DIAGNOSIS — F329 Major depressive disorder, single episode, unspecified: Secondary | ICD-10-CM | POA: Diagnosis not present

## 2015-03-10 DIAGNOSIS — Q762 Congenital spondylolisthesis: Secondary | ICD-10-CM | POA: Insufficient documentation

## 2015-03-10 DIAGNOSIS — Z79899 Other long term (current) drug therapy: Secondary | ICD-10-CM | POA: Diagnosis not present

## 2015-03-10 DIAGNOSIS — M542 Cervicalgia: Secondary | ICD-10-CM | POA: Insufficient documentation

## 2015-03-10 MED ORDER — OXYCODONE-ACETAMINOPHEN 5-325 MG PO TABS: 1.0000 | ORAL_TABLET | Freq: Four times a day (QID) | ORAL | Status: DC | PRN

## 2015-03-10 MED ORDER — NAPROXEN 500 MG PO TABS: 500.0000 mg | ORAL_TABLET | Freq: Two times a day (BID) | ORAL | Status: DC

## 2015-03-10 MED ORDER — OXYCODONE-ACETAMINOPHEN 5-325 MG PO TABS
1.0000 | ORAL_TABLET | Freq: Once | ORAL | Status: AC
  Administered 2015-03-10: 1 via ORAL
  Filled 2015-03-10: qty 1

## 2015-03-10 MED ORDER — PENICILLIN V POTASSIUM 500 MG PO TABS: 500.0000 mg | ORAL_TABLET | Freq: Four times a day (QID) | ORAL | Status: DC

## 2015-03-10 NOTE — Discharge Instructions (Signed)
Follow-up with dentist as soon as possible. Take the penicillin as directed take Naprosyn on a regular basis supplement with the Percocet as needed for additional pain control. Resource guide provided below to help you find a local dentist.   Emergency Department Resource Guide 1) Find a Doctor and Pay Out of Pocket Although you won't have to find out who is covered by your insurance plan, it is a good idea to ask around and get recommendations. You will then need to call the office and see if the doctor you have chosen will accept you as a new patient and what types of options they offer for patients who are self-pay. Some doctors offer discounts or will set up payment plans for their patients who do not have insurance, but you will need to ask so you aren't surprised when you get to your appointment.  2) Contact Your Local Health Department Not all health departments have doctors that can see patients for sick visits, but many do, so it is worth a call to see if yours does. If you don't know where your local health department is, you can check in your phone book. The CDC also has a tool to help you locate your state's health department, and many state websites also have listings of all of their local health departments.  3) Find a Walk-in Clinic If your illness is not likely to be very severe or complicated, you may want to try a walk in clinic. These are popping up all over the country in pharmacies, drugstores, and shopping centers. They're usually staffed by nurse practitioners or physician assistants that have been trained to treat common illnesses and complaints. They're usually fairly quick and inexpensive. However, if you have serious medical issues or chronic medical problems, these are probably not your best option.  No Primary Care Doctor: - Call Health Connect at  [phone] - they can help you locate a primary care doctor that  accepts your insurance, provides certain services,  etc. - Physician Referral Service- [phone]  Chronic Pain Problems: Organization         Address  Phone   Notes  [facility]  [phone] Patients need to be referred by their primary care doctor.   Medication Assistance: Organization         Address  Phone   Notes  [facility] Medication [facility] [address] [place], [place] [address] [phone]) [phone] --Must be a resident of [facility] -- Must have NO insurance coverage whatsoever (no Medicaid/ Medicare, etc.) -- The pt. MUST have a primary care doctor that directs their care regularly and follows them in the community   MedAssist  [phone]   [employer]  [phone]) [phone]    Agencies that provide inexpensive medical care: Organization         Address  Phone   Notes  [facility]  [phone]) [phone]   [facility]    [phone]   [facility] [address] [place], [place] [address] [phone]   [facility] [address], [place] [phone]   [facility]anned Parenthood    [phone]) [phone]   [facility]    [phone]   [facility]  [address], [place] Phone:  [phone], Fax:  [phone] Hours of Operation:  9 am - 6 pm, M-F.  Also accepts Medicaid/Medicare and self-pay.  [facility] [address], [address], [place] Phone: [phone], Fax: [phone]. Hours of Operation:  8:30 am - 5:30 pm, M-F.  Also accepts Medicaid and self-pay.  [facility] [address] [place] Phone: [phone]   [place], [place], [place] [phone], Ext. [phone] Mondays & Thursdays: 7-9 AM.  First 15 patients are seen on a first come, first serve basis.    [place] Providers:  Organization         Address  Phone   Notes  [facility] [address], [place] [phone] Also accepts self-pay patients.  [facility] [address], [place]  [phone]   [place], [address], [place] [phone]   [facility] [address], [place] [phone]   Lucianne Lei [address], [place]   [phone] Only accepts [place] Access [place] patients after they have their name applied to their card.   Self-Pay (no insurance) in [facility]:  Organization         Address  Phone   Notes  Sickle Cell Patients, [facility] [place] [phone]   [facility] [place] [phone]   [facility]  [place], [place], [place] [phone]   [facility]/Dr. Osei-Bonsu  [address] [place] or [address], [place] [phone] Phone number for both [place] and [place] locations is the same.  [facility] [address], [place] [phone]   [facility] [address], [place] or [address] [phone]   [facility] [address] [place] [phone], phone; [phone], fax Sees patients 1st and 3rd Saturday of every month.  Must not qualify for public or private insurance (i.e. Medicaid, Medicare, [place] Health Choice, Veterans' Benefits)  Household income should be no more than 200% of the poverty level The clinic cannot treat you if you are pregnant or think you are pregnant  Sexually transmitted diseases are not treated at the clinic.    Dental Care: Organization         Address  Phone  Notes  [facility] Department of [facility] [place] [phone] Accepts children up to  age 78 who are enrolled in [place] or [place]; pregnant women with a Medicaid card; and children who have applied for Medicaid or [place] Health Choice, but were declined, whose parents can pay a reduced fee at time of service.  [facility] Department of [facility]  [address], [place] [phone] Accepts children up to age 39 who are enrolled in [place] or [place]; pregnant women with a Medicaid card; and children who have applied for Medicaid or [place] Health Choice, but were declined, whose parents can pay a reduced fee at time of service.  [facility] Adult Dental Access PROGRAM  [place] [phone] Patients are seen by appointment only. Walk-ins are not accepted. [place] will see patients 78 years of age and older. Monday - Tuesday (8am-5pm) Most Wednesdays (8:30-5pm) $30 per  visit, cash only  [facility] Adult [employer] PROGRAM  [address], [facility] [phone] Patients are seen by appointment only. Walk-ins are not accepted. [place] will see patients 25 years of age and older. One Wednesday Evening (Monthly: Volunteer Based).  $30 per visit, cash only  [place]  [phone] for adults; Children under age 88, call Graduate Pediatric Dentistry at [phone]. Children aged 53-14, please call [phone] to request a pediatric application.  Dental services are provided in all areas of dental care including fillings, crowns and bridges, complete and partial dentures, implants, gum treatment, root canals, and extractions. Preventive care is also provided. Treatment is provided to both adults and children. Patients are selected via a lottery and there is often a waiting list.   [facility] [address] [place]  [phone] [email or web address]   [facility] [address] [place], [place] [phone], Ext. [phone] Second and Fourth Thursday of  each month, opens at 6:30 AM; Clinic ends at 9 AM.  Patients are seen on a first-come first-served basis, and a limited number are seen during each clinic.   [facility]  [address] Hillard Danker [place], [place] [phone]   Eligibility Requirements You must have lived in [place], [place], or [place] counties for at least the last three months.   You cannot be eligible for state or federal sponsored [employer], including [employer], [place], or [employer].   You generally cannot be eligible for healthcare insurance through your employer.    How to apply: Eligibility screenings are held every Tuesday and Wednesday afternoon from 1:00 pm until 4:00 pm. You do not need an appointment for the interview!  [facility] [address], [place], [place]   [place]  [place] Department  [place]  [phone]    Behavioral Health Resources in the Community: Intensive Outpatient Programs Organization         Address  Phone  Notes  [place]. [address] [place], [place] [phone]   [facility] Outpatient [address] [place], [place]   ADS: Alcohol & Drug Svcs [address] [place], [place]   [place] [address],  [place], [place] or [phone]   Substance Abuse Resources Organization         Address  Phone  Notes  Alcohol and Drug Services  [phone]   [place]  [phone]   [facility] [place]   Chinita Pester  [phone]   Residential & Outpatient Substance Abuse Program  [phone]   Psychological Services Organization         Address  Phone  Notes  [facility]  [place]  [phone]   [place] [address],  [place] or [phone]    Mobile Crisis Teams Organization         Address  Phone  Notes  [facility], Mobile Crisis Care Unit  [phone]   [facility]  [address] [place], [place]   [place] [address], [place] [phone]    Self-Help/Support Groups Organization         Address  Phone  Notes  [facility] [place] - variety of support groups  [place] Call for more information  Narcotics Anonymous (NA), [facility]aring [facility] [address], [employer] [place]  2 meetings at this location   Special educational needs teacher         Address  Phone  Notes  [facility]AP Residential Treatment [place],    [place]  [phone]   [facility]  [address], [place] [phone], [place], [place]   [place], [place] [phone] Admissions: 8am-3pm M-F  Incentives Substance [place] [address],    [place], [place] [phone]   [facility] [address] [place], [place], [place]   [facility] [address]  [place], [place]   [facility] Programs - Intensive Outpatient [address], [place], [place]   [facility] ([place].,  [place], [place] [phone] or [phone]   [facility] Services (RTS) [address] [place], [place] Accepts Medicaid  [facility] [place] [address],  [place] [phone] Substance Abuse/Addiction Treatment   [facility] Organization         Address  Phone  Notes  [facility]  [phone]   Domenic Schwab, PhD [address] [place]lis [place], [place]   [phone] or [phone]   [place], [place] [phone]     [facility] [address], [place], [place] [phone] Insurance/Medicaid/sponsorship through [facility] and Families [address], [address]                                    [place], [place] [phone] [place] [address], [place] [phone]    Dr. Adele Schilder  [phone]   [facility] Dept. 1) [address], [place] 2) [place] 3)  [address], [place] [phone]  [phone]   [place] [phone] or [phone] (After Hours)

## 2015-03-10 NOTE — ED Notes (Signed)
DC instructions reviewed with pt, stressed importance of making a dental appointment for follow up per EDP recommendations. Also, pt teaching done re: safety while taking PO narcotics and to take and complete all abx prescribed for this visit

## 2015-03-10 NOTE — ED Notes (Signed)
Patient c/o left lower jaw pain due to crack tooth for the past few days

## 2015-03-10 NOTE — ED Provider Notes (Signed)
CSN: [id number]     Arrival date & time 03/10/15  0711 History   First MD Initiated Contact with Patient 03/10/15 [phone]     Chief Complaint  Patient presents with  . Dental Pain     (Consider location/radiation/quality/duration/timing/severity/associated sxs/prior Treatment) Patient is a 34 y.o. female presenting with tooth pain. The history is provided by the patient.  Dental Pain Associated symptoms: facial swelling, headaches and neck pain   Associated symptoms: no congestion and no fever    patient with onset of left lower molar tooth pain with facial swelling yesterday. Got worse today. Patient states the pain is 10 out of 10. Patient able to swallow okay. No fevers.  Past Medical History  Diagnosis Date  . Anxiety   . Depression   . Headache(784.0) hemiplegic migraine  . Chondromalacia of patella   . Lumbar spondylolysis   . Low back pain   . Chronic pelvic pain in female   . Chronic pain syndrome   . Spinal stenosis in cervical region   . Congenital spondylolisthesis   . Spinal stenosis in cervical region   . Congenital spondylolisthesis    Past Surgical History  Procedure Laterality Date  . Cesarean section    . Tonsillectomy    . Tubal ligation    . Mouth surgery     No family history on file. Social History  Substance Use Topics  . Smoking status: Current Every Day Smoker -- 1.00 packs/day    Types: Cigarettes  . Smokeless tobacco: Never Used  . Alcohol Use: No   OB History    No data available     Review of Systems  Constitutional: Negative for fever.  HENT: Positive for dental problem and facial swelling. Negative for congestion and trouble swallowing.   Eyes: Negative for redness.  Respiratory: Negative for shortness of breath.   Cardiovascular: Negative for chest pain.  Gastrointestinal: Negative for abdominal pain.  Musculoskeletal: Positive for neck pain. Negative for neck stiffness.  Skin: Negative for rash.  Neurological: Positive for  headaches.  Hematological: Does not bruise/bleed easily.  Psychiatric/Behavioral: Negative for confusion.      Allergies  Eggs or egg-derived products; Gabapentin; Hydrocodone; Toradol; Tramadol; and Metronidazole  Home Medications   Prior to Admission medications   Medication Sig Start Date End Date Taking? Authorizing Provider  ALPRAZolam (XANAX PO) Take by mouth.    Historical Provider, MD  carisoprodol (SOMA) 350 MG tablet Take 1 tablet (350 mg total) by mouth 3 (three) times daily as needed. 07/31/13   Nestor RampSara L Neal, MD  cyclobenzaprine (FLEXERIL) 10 MG tablet Take 1 tablet (10 mg total) by mouth 2 (two) times daily as needed for muscle spasms. 06/05/14   Ladona MowJoe Mintz, PA-C  DULoxetine (CYMBALTA) 30 MG capsule Take 1 capsule (30 mg total) by mouth daily. 02/08/13   Moses MannersWilliam A Hensel, MD  methocarbamol (ROBAXIN) 500 MG tablet Take 1 tablet (500 mg total) by mouth 2 (two) times daily. 11/15/13   Ladona MowJoe Mintz, PA-C  naproxen (NAPROSYN) 500 MG tablet Take 1 tablet (500 mg total) by mouth 2 (two) times daily. 03/10/15   Vanetta MuldersScott Arjay Jaskiewicz, MD  oxyCODONE-acetaminophen (PERCOCET/ROXICET) 5-325 MG tablet Take 1 tablet by mouth every 6 (six) hours as needed for severe pain. 03/10/15   Vanetta MuldersScott Waunetta Riggle, MD  oxyCODONE-acetaminophen (ROXICET) 5-325 MG per tablet Take 1 tablet by mouth every 4 (four) hours as needed for severe pain. 02/18/14   Hope Orlene OchM Neese, NP  penicillin v potassium (VEETID) 500 MG tablet  Take 1 tablet (500 mg total) by mouth 4 (four) times daily. 03/10/15   Vanetta Mulders, MD   BP 144/84 mmHg  Pulse 116  Temp(Src) 97.6 F (36.4 C) (Oral)  Resp 22  SpO2 100% Physical Exam  Constitutional: She is oriented to person, place, and time. She appears well-developed and well-nourished. No distress.  HENT:  Head: Normocephalic and atraumatic.  Mouth/Throat: Oropharynx is clear and moist.  The left lower molar with an avulsion fracture and decay swelling to the cheek area. No erythema. No swelling  to the floor the mouth.  Eyes: Conjunctivae and EOM are normal. Pupils are equal, round, and reactive to light.  Neck: Normal range of motion. Neck supple.  Cardiovascular: Normal rate, regular rhythm and normal heart sounds.   No murmur heard. Pulmonary/Chest: Effort normal and breath sounds normal. No respiratory distress.  Abdominal: Soft. Bowel sounds are normal. There is no tenderness.  Musculoskeletal: Normal range of motion.  Neurological: She is alert and oriented to person, place, and time. No cranial nerve deficit. She exhibits normal muscle tone. Coordination normal.  Skin: Skin is warm. No erythema.  Nursing note and vitals reviewed.   ED Course  Procedures (including critical care time) Labs Review Labs Reviewed - No data to display  Imaging Review No results found. I have personally reviewed and evaluated these images and lab results as part of my medical decision-making.   EKG Interpretation None      MDM   Final diagnoses:  Toothache    Patient with onset of left lower molar tooth pain and swelling to the left lower part of the cheek. Tooth shows evidence of avulsion fracture and decay. Patient will be treated with penicillin Naprosyn. Patient able to take Motrin despite allergy profile and Percocet patient able to take that as well.    Vanetta Mulders, MD 03/10/15 [phone]

## 2015-09-18 ENCOUNTER — Emergency Department (HOSPITAL_BASED_OUTPATIENT_CLINIC_OR_DEPARTMENT_OTHER)
Admission: EM | Admit: 2015-09-18 | Discharge: 2015-09-18 | Disposition: A | Discharge status: Home / Self Care | Payer: No Typology Code available for payment source | Attending: Emergency Medicine | Admitting: Emergency Medicine

## 2015-09-18 ENCOUNTER — Encounter (HOSPITAL_BASED_OUTPATIENT_CLINIC_OR_DEPARTMENT_OTHER): Payer: Self-pay

## 2015-09-18 DIAGNOSIS — F329 Major depressive disorder, single episode, unspecified: Secondary | ICD-10-CM | POA: Insufficient documentation

## 2015-09-18 DIAGNOSIS — R109 Unspecified abdominal pain: Secondary | ICD-10-CM

## 2015-09-18 DIAGNOSIS — F17210 Nicotine dependence, cigarettes, uncomplicated: Secondary | ICD-10-CM | POA: Insufficient documentation

## 2015-09-18 DIAGNOSIS — Z791 Long term (current) use of non-steroidal anti-inflammatories (NSAID): Secondary | ICD-10-CM | POA: Insufficient documentation

## 2015-09-18 DIAGNOSIS — N390 Urinary tract infection, site not specified: Secondary | ICD-10-CM | POA: Insufficient documentation

## 2015-09-18 LAB — URINALYSIS, ROUTINE W REFLEX MICROSCOPIC
Bilirubin Urine: NEGATIVE
Glucose, UA: NEGATIVE mg/dL
Ketones, ur: NEGATIVE mg/dL
Nitrite: POSITIVE — AB
Protein, ur: NEGATIVE mg/dL
Specific Gravity, Urine: 1.009 (ref 1.005–1.030)
pH: 7 (ref 5.0–8.0)

## 2015-09-18 LAB — URINE MICROSCOPIC-ADD ON

## 2015-09-18 LAB — PREGNANCY, URINE: Preg Test, Ur: NEGATIVE

## 2015-09-18 MED ORDER — CEPHALEXIN 500 MG PO CAPS: 500.0000 mg | ORAL_CAPSULE | Freq: Two times a day (BID) | ORAL | Status: DC

## 2015-09-18 MED ORDER — PHENAZOPYRIDINE HCL 200 MG PO TABS: 200.0000 mg | ORAL_TABLET | Freq: Three times a day (TID) | ORAL | Status: DC | PRN

## 2015-09-18 MED ORDER — ACETAMINOPHEN-CODEINE #3 300-30 MG PO TABS: 1.0000 | ORAL_TABLET | Freq: Four times a day (QID) | ORAL | Status: DC | PRN

## 2015-09-18 MED FILL — ACETAMINOPHEN/COD #3 TABLET: 300-30 | 2 days supply | Qty: 10 | Fill #0

## 2015-09-18 MED FILL — CEPHALEXIN 500 MG CAPSULE: 500 | 10 days supply | Qty: 20 | Fill #0

## 2015-09-18 NOTE — ED Notes (Signed)
Left flank pain x 2 days-urinary freq x 1 week-NAD-steady gait

## 2015-09-18 NOTE — Discharge Instructions (Signed)
Take the prescribed medication as directed. Follow-up with urology if symptoms worsen. Return to the ED for new concerns.

## 2015-09-18 NOTE — ED Provider Notes (Signed)
CSN: [id number]     Arrival date & time 09/18/15  1226 History   First MD Initiated Contact with Patient 09/18/15 1312     Chief Complaint  Patient presents with  . Flank Pain     (Consider location/radiation/quality/duration/timing/severity/associated sxs/prior Treatment) Patient is a 35 y.o. female presenting with flank pain. The history is provided by the patient and medical records.  Flank Pain    35 year old female with history of anxiety, depression, chronic back pain, presenting to the ED for left flank pain for the past 2 days. She states for the past week she has had urinary frequency and dysuria. She denies any fever or chills. She denies any history of kidney stones. She does have history of kidney infection in the past with similar symptoms. She has taken naproxen and Motrin at home without relief.  VSS.  Past Medical History  Diagnosis Date  . Anxiety   . Depression   . Headache(784.0) hemiplegic migraine  . Chondromalacia of patella   . Lumbar spondylolysis   . Low back pain   . Chronic pelvic pain in female   . Chronic pain syndrome   . Spinal stenosis in cervical region   . Congenital spondylolisthesis   . Spinal stenosis in cervical region   . Congenital spondylolisthesis    Past Surgical History  Procedure Laterality Date  . Cesarean section    . Tonsillectomy    . Tubal ligation    . Mouth surgery     No family history on file. Social History  Substance Use Topics  . Smoking status: Current Every Day Smoker -- 1.00 packs/day    Types: Cigarettes  . Smokeless tobacco: Never Used  . Alcohol Use: No   OB History    No data available     Review of Systems  Genitourinary: Positive for dysuria, frequency and flank pain.  All other systems reviewed and are negative.     Allergies  Eggs or egg-derived products; Gabapentin; Hydrocodone; Toradol; Tramadol; and Metronidazole  Home Medications   Prior to Admission medications   Medication Sig  Start Date End Date Taking? Authorizing Provider  ibuprofen (ADVIL,MOTRIN) 200 MG tablet Take 200 mg by mouth every 6 (six) hours as needed.   Yes Historical Provider, MD  naproxen (NAPROSYN) 500 MG tablet Take 1 tablet (500 mg total) by mouth 2 (two) times daily. 03/10/15   Vanetta Mulders, MD  penicillin v potassium (VEETID) 500 MG tablet Take 1 tablet (500 mg total) by mouth 4 (four) times daily. 03/10/15   Vanetta Mulders, MD   BP 132/95 mmHg  Pulse 89  Temp(Src) 98.6 F (37 C) (Oral)  Resp 18  Ht  (1.626 m)  Wt 50.803 kg  BMI 19.22 kg/m2  SpO2 98%  LMP 09/04/2015   Physical Exam  Constitutional: She is oriented to person, place, and time. She appears well-developed and well-nourished. No distress.  HENT:  Head: Normocephalic and atraumatic.  Mouth/Throat: Oropharynx is clear and moist.  Eyes: Conjunctivae and EOM are normal. Pupils are equal, round, and reactive to light.  Neck: Normal range of motion. Neck supple.  Cardiovascular: Normal rate, regular rhythm and normal heart sounds.   Pulmonary/Chest: Effort normal and breath sounds normal. No respiratory distress. She has no wheezes.  Abdominal: Soft. Bowel sounds are normal. There is no tenderness. There is no guarding.  Abdomen soft, nontender Reports left CVA pain, no tenderness on exam  Musculoskeletal: Normal range of motion. She exhibits no edema.  Neurological:  She is alert and oriented to person, place, and time.  Skin: Skin is warm and dry. She is not diaphoretic.  Psychiatric: She has a normal mood and affect.  Nursing note and vitals reviewed.   ED Course  Procedures (including critical care time) Labs Review Labs Reviewed  URINALYSIS, ROUTINE W REFLEX MICROSCOPIC (NOT [facility]) - Abnormal; Notable for the following:    APPearance CLOUDY (*)    Hgb urine dipstick SMALL (*)    Nitrite POSITIVE (*)    Leukocytes, UA MODERATE (*)    All other components within normal limits  URINE MICROSCOPIC-ADD ON -  Abnormal; Notable for the following:    Squamous Epithelial / LPF 0-5 (*)    Bacteria, UA MANY (*)    All other components within normal limits  PREGNANCY, URINE    Imaging Review No results found. I have personally reviewed and evaluated these images and lab results as part of my medical decision-making.   EKG Interpretation None      MDM   Final diagnoses:  UTI (lower urinary tract infection)  Left flank pain   35 year old female here with urinary frequency, dysuria, and new left flank pain. Patient is afebrile, nontoxic. She is in no acute distress, does not appear uncomfortable. Abdomen is soft and benign. No CVA tenderness noted on exam. UA is infectious, nitrite positive. Urine pregnancy test is negative. No history of kidney stones, lower suspicion for obstructed stone at this time.  Symptoms more consistent with early/developing pyelonephritis. Will start on Keflex, Pyridium. Small supply pain medication given at patient's request.  Discussed plan with patient, he/she acknowledged understanding and agreed with plan of care.  Return precautions given for new or worsening symptoms.  Garlon HatchetLisa M Joselinne Lawal, PA-C 09/18/15 1451  Raeford RazorStephen Kohut, MD 09/26/15 [phone]

## 2016-05-16 ENCOUNTER — Emergency Department (HOSPITAL_COMMUNITY)
Admission: EM | Admit: 2016-05-16 | Discharge: 2016-05-16 | Disposition: A | Discharge status: Home / Self Care | Payer: No Typology Code available for payment source | Attending: Emergency Medicine | Admitting: Emergency Medicine

## 2016-05-16 ENCOUNTER — Encounter (HOSPITAL_COMMUNITY): Payer: Self-pay

## 2016-05-16 DIAGNOSIS — F17210 Nicotine dependence, cigarettes, uncomplicated: Secondary | ICD-10-CM | POA: Insufficient documentation

## 2016-05-16 DIAGNOSIS — Y939 Activity, unspecified: Secondary | ICD-10-CM | POA: Diagnosis not present

## 2016-05-16 DIAGNOSIS — Y92410 Unspecified street and highway as the place of occurrence of the external cause: Secondary | ICD-10-CM | POA: Insufficient documentation

## 2016-05-16 DIAGNOSIS — Z79899 Other long term (current) drug therapy: Secondary | ICD-10-CM | POA: Diagnosis not present

## 2016-05-16 DIAGNOSIS — S199XXA Unspecified injury of neck, initial encounter: Secondary | ICD-10-CM | POA: Diagnosis not present

## 2016-05-16 DIAGNOSIS — Y999 Unspecified external cause status: Secondary | ICD-10-CM | POA: Diagnosis not present

## 2016-05-16 DIAGNOSIS — M25561 Pain in right knee: Secondary | ICD-10-CM | POA: Diagnosis not present

## 2016-05-16 MED ORDER — CARISOPRODOL 350 MG PO TABS
350.0000 mg | ORAL_TABLET | Freq: Once | ORAL | Status: AC
  Administered 2016-05-16: 350 mg via ORAL
  Filled 2016-05-16: qty 1

## 2016-05-16 MED ORDER — ACETAMINOPHEN 500 MG PO TABS
1000.0000 mg | ORAL_TABLET | Freq: Once | ORAL | Status: AC
Start: 2016-05-16 — End: 2016-05-16
  Administered 2016-05-16: 1000 mg via ORAL
  Filled 2016-05-16: qty 2

## 2016-05-16 MED ORDER — CARISOPRODOL 350 MG PO TABS: 350.0000 mg | ORAL_TABLET | Freq: Three times a day (TID) | ORAL | 0 refills | Status: DC
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
## 2016-05-18 ENCOUNTER — Emergency Department (HOSPITAL_BASED_OUTPATIENT_CLINIC_OR_DEPARTMENT_OTHER): Payer: No Typology Code available for payment source

## 2016-05-18 ENCOUNTER — Emergency Department (HOSPITAL_BASED_OUTPATIENT_CLINIC_OR_DEPARTMENT_OTHER)
Admission: EM | Admit: 2016-05-18 | Discharge: 2016-05-19 | Disposition: A | Discharge status: Home / Self Care | Payer: No Typology Code available for payment source | Attending: Emergency Medicine | Admitting: Emergency Medicine

## 2016-05-18 ENCOUNTER — Encounter (HOSPITAL_BASED_OUTPATIENT_CLINIC_OR_DEPARTMENT_OTHER): Payer: Self-pay | Admitting: *Deleted

## 2016-05-18 DIAGNOSIS — S5002XA Contusion of left elbow, initial encounter: Secondary | ICD-10-CM | POA: Diagnosis not present

## 2016-05-18 DIAGNOSIS — F17210 Nicotine dependence, cigarettes, uncomplicated: Secondary | ICD-10-CM | POA: Insufficient documentation

## 2016-05-18 DIAGNOSIS — S60051A Contusion of right little finger without damage to nail, initial encounter: Secondary | ICD-10-CM

## 2016-05-18 DIAGNOSIS — Y939 Activity, unspecified: Secondary | ICD-10-CM | POA: Diagnosis not present

## 2016-05-18 DIAGNOSIS — S6991XA Unspecified injury of right wrist, hand and finger(s), initial encounter: Secondary | ICD-10-CM | POA: Diagnosis present

## 2016-05-18 DIAGNOSIS — Y92410 Unspecified street and highway as the place of occurrence of the external cause: Secondary | ICD-10-CM | POA: Insufficient documentation

## 2016-05-18 DIAGNOSIS — Y999 Unspecified external cause status: Secondary | ICD-10-CM | POA: Diagnosis not present
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
## 2016-05-18 NOTE — ED Notes (Signed)
States she is going to her car and will come right back.
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
## 2016-05-19 MED ORDER — ACETAMINOPHEN-CODEINE #3 300-30 MG PO TABS: 1.0000 | ORAL_TABLET | Freq: Four times a day (QID) | ORAL | 0 refills | Status: DC | PRN

## 2016-05-19 NOTE — ED Notes (Signed)
Pt verbalizes understanding of d/c instructions and denies any further needs at this time. 

## 2016-10-24 ENCOUNTER — Emergency Department (HOSPITAL_BASED_OUTPATIENT_CLINIC_OR_DEPARTMENT_OTHER): Payer: Self-pay

## 2016-10-24 ENCOUNTER — Emergency Department (HOSPITAL_BASED_OUTPATIENT_CLINIC_OR_DEPARTMENT_OTHER)
Admission: EM | Admit: 2016-10-24 | Discharge: 2016-10-25 | Disposition: A | Discharge status: Home / Self Care | Payer: Self-pay | Attending: Emergency Medicine | Admitting: Emergency Medicine

## 2016-10-24 ENCOUNTER — Encounter (HOSPITAL_BASED_OUTPATIENT_CLINIC_OR_DEPARTMENT_OTHER): Payer: Self-pay | Admitting: Emergency Medicine

## 2016-10-24 DIAGNOSIS — Y999 Unspecified external cause status: Secondary | ICD-10-CM | POA: Insufficient documentation

## 2016-10-24 DIAGNOSIS — Y939 Activity, unspecified: Secondary | ICD-10-CM | POA: Insufficient documentation

## 2016-10-24 DIAGNOSIS — Y929 Unspecified place or not applicable: Secondary | ICD-10-CM | POA: Insufficient documentation

## 2016-10-24 DIAGNOSIS — S60812A Abrasion of left wrist, initial encounter: Secondary | ICD-10-CM | POA: Insufficient documentation

## 2016-10-24 DIAGNOSIS — Z79899 Other long term (current) drug therapy: Secondary | ICD-10-CM | POA: Insufficient documentation

## 2016-10-24 DIAGNOSIS — S51819A Laceration without foreign body of unspecified forearm, initial encounter: Secondary | ICD-10-CM

## 2016-10-24 DIAGNOSIS — W25XXXA Contact with sharp glass, initial encounter: Secondary | ICD-10-CM | POA: Insufficient documentation

## 2016-10-24 DIAGNOSIS — S51812A Laceration without foreign body of left forearm, initial encounter: Secondary | ICD-10-CM | POA: Insufficient documentation

## 2016-10-24 DIAGNOSIS — S60512A Abrasion of left hand, initial encounter: Secondary | ICD-10-CM | POA: Insufficient documentation

## 2016-10-24 DIAGNOSIS — S5012XA Contusion of left forearm, initial encounter: Secondary | ICD-10-CM

## 2016-10-24 DIAGNOSIS — W548XXA Other contact with dog, initial encounter: Secondary | ICD-10-CM

## 2016-10-24 DIAGNOSIS — F17210 Nicotine dependence, cigarettes, uncomplicated: Secondary | ICD-10-CM | POA: Insufficient documentation

## 2016-10-24 NOTE — ED Triage Notes (Signed)
Pt presents with c/o laceration to left forearm after her arm went thru a window, sts her dog pulled her arm and it thru a window.

## 2016-10-25 MED ORDER — CEPHALEXIN 500 MG PO CAPS: 500.0000 mg | ORAL_CAPSULE | Freq: Four times a day (QID) | ORAL | 0 refills | Status: DC

## 2016-10-25 MED ORDER — CEPHALEXIN 250 MG PO CAPS
1000.0000 mg | ORAL_CAPSULE | Freq: Once | ORAL | Status: AC
  Administered 2016-10-25: 1000 mg via ORAL
  Filled 2016-10-25: qty 4

## 2016-10-25 MED ORDER — OXYCODONE-ACETAMINOPHEN 5-325 MG PO TABS: 1.0000 | ORAL_TABLET | Freq: Four times a day (QID) | ORAL | 0 refills | Status: DC | PRN

## 2016-10-25 MED ORDER — OXYCODONE-ACETAMINOPHEN 5-325 MG PO TABS
1.0000 | ORAL_TABLET | Freq: Once | ORAL | Status: AC
  Administered 2016-10-25: 1 via ORAL
  Filled 2016-10-25: qty 1

## 2016-10-25 NOTE — ED Provider Notes (Addendum)
[facility] Provider Note: Lowella DellJ. Lane Dove Gresham, MD, FACEP  CSN: [id number] MRN: [id number] ARRIVAL: 10/24/16 at 2254 ROOM: MH02/MH02   CHIEF COMPLAINT  Extremity Laceration   HISTORY OF PRESENT ILLNESS  Kiara Murillo is a 36 y.o. female who tripped over her dog yesterday evening. She fell and her left hand and forearm with a glass window. She has multiple small lacerations and abrasions to her left hand and forearm with a hematoma of her left forearm. She rates her pain as a 7 out of 10 at the hematoma site. Pain is worse with movement or palpation. She denies other injury. Tetanus is up-to-date.  [facility] [place] reveals the patient has received one prescription for oxycodone 6 tablets in October of last year.    Past Medical History:  Diagnosis Date  . Anxiety   . Chondromalacia of patella   . Chronic pain syndrome   . Chronic pelvic pain in female   . Congenital spondylolisthesis   . Congenital spondylolisthesis   . Depression   . Headache(784.0) hemiplegic migraine  . Low back pain   . Lumbar spondylolysis   . Spinal stenosis in cervical region   . Spinal stenosis in cervical region     Past Surgical History:  Procedure Laterality Date  . CESAREAN SECTION    . MOUTH SURGERY    . TONSILLECTOMY    . TUBAL LIGATION      No family history on file.  Social History  Substance Use Topics  . Smoking status: Current Every Day Smoker    Packs/day: 1.00    Types: Cigarettes  . Smokeless tobacco: Never Used  . Alcohol use No    Prior to Admission medications   Medication Sig Start Date End Date Taking? Authorizing Provider  cephALEXin (KEFLEX) 500 MG capsule Take 1 capsule (500 mg total) by mouth 4 (four) times daily. 10/25/16   Telly Broberg, MD  oxyCODONE-acetaminophen (PERCOCET) 5-325 MG tablet Take 1 tablet by mouth every 6 (six) hours as needed (for pain). 10/25/16   Xavier Munger, MD    Allergies Eggs  or egg-derived products; Gabapentin; Hydrocodone; Toradol [ketorolac tromethamine]; Tramadol; and Metronidazole   REVIEW OF SYSTEMS  Negative except as noted here or in the History of Present Illness.   PHYSICAL EXAMINATION  Initial Vital Signs Blood pressure 119/84, pulse 87, temperature 98.2 F (36.8 C), temperature source Oral, resp. rate 18, height 5\' 4"  (1.626 m), weight 54.4 kg (120 lb), last menstrual period 10/21/2016, SpO2 98 %.  Examination General: Well-developed, well-nourished female in no acute distress; appearance consistent with age of record HENT: normocephalic; atraumatic Eyes: pupils equal, round and reactive to light; extraocular muscles intact Neck: supple Heart: regular rate and rhythm Lungs: clear to auscultation bilaterally Abdomen: soft; nondistended; nontender; bowel sounds present Extremities: No deformity; full range of motion; pulses normal; tender, ecchymotic, swollen area ulnar side of left distal forearm consistent with hematoma Neurologic: Awake, alert and oriented; motor function intact in all extremities and symmetric; no facial droop Skin: Warm and dry; multiple superficial lacerations and abrasions of left hand and forearm Psychiatric: Normal mood and affect   RESULTS  Summary of this visit's results, reviewed by myself:   EKG Interpretation  Date/Time:    Ventricular Rate:    PR Interval:    QRS Duration:   QT Interval:    QTC Calculation:   R Axis:     Text Interpretation:  Laboratory Studies: No results found for this or any previous visit (from the past 24 hour(s)). Imaging Studies: Dg Forearm Left  Result Date: 10/24/2016 CLINICAL DATA:  Trip and fall through glass door with laceration of forearm. EXAM: LEFT FOREARM - 2 VIEW COMPARISON:  None. FINDINGS: There is no evidence of fracture or other focal bone lesions. Wrist and elbow alignment is maintained. There is soft tissue edema about the ulnar volar distal forearm  without radiopaque foreign body. IMPRESSION: Soft tissue edema without radiopaque foreign body or osseous abnormality. Electronically Signed   By: Rubye Oaks M.D.   On: 10/24/2016 23:57    ED COURSE  Nursing notes and initial vitals signs, including pulse oximetry, reviewed.  Vitals:   10/24/16 2305 10/24/16 2307  BP: 119/84   Pulse: 87   Resp: 18   Temp: 98.2 F (36.8 C)   TempSrc: Oral   SpO2: 98%   Weight:  54.4 kg (120 lb)  Height:  5\' 4"  (1.626 m)   None of the patient's wounds require primary closure.  PROCEDURES    ED DIAGNOSES     ICD-10-CM   1. Abrasion of multiple sites of hand and wrist, left, initial encounter Z61.096E    A54.098J   2. Laceration of forearm S51.819A DG Forearm Left    DG Forearm Left  3. Other contact with dog, initial encounter W54.8XXA   4. Traumatic hematoma of left forearm, initial encounter S50.12XA        Alycia Cooperwood, Jonny Ruiz, MD 10/25/16 0031    Paula Libra, MD 10/25/16 [phone]

## 2016-10-25 NOTE — ED Notes (Signed)
EDP into room, prior to RN assessment, see MD notes, pending orders.   

## 2017-05-30 ENCOUNTER — Encounter (HOSPITAL_BASED_OUTPATIENT_CLINIC_OR_DEPARTMENT_OTHER): Payer: Self-pay | Admitting: *Deleted

## 2017-05-30 ENCOUNTER — Emergency Department (HOSPITAL_BASED_OUTPATIENT_CLINIC_OR_DEPARTMENT_OTHER)
Admission: EM | Admit: 2017-05-30 | Discharge: 2017-05-30 | Disposition: A | Discharge status: Home / Self Care | Payer: Self-pay | Attending: Emergency Medicine | Admitting: Emergency Medicine

## 2017-05-30 ENCOUNTER — Other Ambulatory Visit: Payer: Self-pay

## 2017-05-30 DIAGNOSIS — F419 Anxiety disorder, unspecified: Secondary | ICD-10-CM | POA: Insufficient documentation

## 2017-05-30 DIAGNOSIS — F17210 Nicotine dependence, cigarettes, uncomplicated: Secondary | ICD-10-CM | POA: Insufficient documentation

## 2017-05-30 DIAGNOSIS — N390 Urinary tract infection, site not specified: Secondary | ICD-10-CM | POA: Insufficient documentation

## 2017-05-30 LAB — CBC WITH DIFFERENTIAL/PLATELET
Basophils Absolute: 0 10*3/uL (ref 0.0–0.1)
Basophils Relative: 0 %
Eosinophils Absolute: 0 10*3/uL (ref 0.0–0.7)
Eosinophils Relative: 0 %
HCT: 43.3 % (ref 36.0–46.0)
Hemoglobin: 14.7 g/dL (ref 12.0–15.0)
Lymphocytes Relative: 17 %
Lymphs Abs: 1.3 10*3/uL (ref 0.7–4.0)
MCH: 31.8 pg (ref 26.0–34.0)
MCHC: 33.9 g/dL (ref 30.0–36.0)
MCV: 93.7 fL (ref 78.0–100.0)
Monocytes Absolute: 0.7 10*3/uL (ref 0.1–1.0)
Monocytes Relative: 9 %
Neutro Abs: 5.8 10*3/uL (ref 1.7–7.7)
Neutrophils Relative %: 74 %
Platelets: 257 10*3/uL (ref 150–400)
RBC: 4.62 MIL/uL (ref 3.87–5.11)
RDW: 12.4 % (ref 11.5–15.5)
WBC: 7.8 10*3/uL (ref 4.0–10.5)

## 2017-05-30 LAB — URINALYSIS, ROUTINE W REFLEX MICROSCOPIC
Bilirubin Urine: NEGATIVE
Glucose, UA: NEGATIVE mg/dL
Ketones, ur: 15 mg/dL — AB
Leukocytes, UA: NEGATIVE
Nitrite: POSITIVE — AB
Protein, ur: NEGATIVE mg/dL
Specific Gravity, Urine: 1.025 (ref 1.005–1.030)
pH: 6.5 (ref 5.0–8.0)

## 2017-05-30 LAB — BASIC METABOLIC PANEL
Anion gap: 10 (ref 5–15)
BUN: 10 mg/dL (ref 6–20)
CO2: 22 mmol/L (ref 22–32)
Calcium: 9.3 mg/dL (ref 8.9–10.3)
Chloride: 105 mmol/L (ref 101–111)
Creatinine, Ser: 0.75 mg/dL (ref 0.44–1.00)
GFR calc Af Amer: >60 mL/min (ref >60)
GFR calc non Af Amer: >60 mL/min (ref >60)
Glucose, Bld: 149 mg/dL — ABNORMAL HIGH (ref 65–99)
Potassium: 3.6 mmol/L (ref 3.5–5.1)
Sodium: 137 mmol/L (ref 135–145)

## 2017-05-30 LAB — URINALYSIS, MICROSCOPIC (REFLEX)

## 2017-05-30 LAB — PREGNANCY, URINE: Preg Test, Ur: NEGATIVE

## 2017-05-30 MED ORDER — SODIUM CHLORIDE 0.9 % IV BOLUS (SEPSIS)
1000.0000 mL | Freq: Once | INTRAVENOUS | Status: AC
  Administered 2017-05-30: 1000 mL via INTRAVENOUS

## 2017-05-30 MED ORDER — CEPHALEXIN 500 MG PO CAPS: 500.0000 mg | ORAL_CAPSULE | Freq: Three times a day (TID) | ORAL | 0 refills | Status: DC

## 2017-05-30 MED ORDER — PROMETHAZINE HCL 25 MG PO TABS: 25.0000 mg | ORAL_TABLET | Freq: Three times a day (TID) | ORAL | 0 refills | Status: DC | PRN

## 2017-05-30 MED ORDER — DEXTROSE 5 % IV SOLN
1.0000 g | Freq: Once | INTRAVENOUS | Status: AC
  Administered 2017-05-30: 1 g via INTRAVENOUS
  Filled 2017-05-30: qty 10

## 2017-05-30 NOTE — ED Provider Notes (Signed)
[facility] EMERGENCY DEPARTMENT Provider Note   CSN: [id number] Arrival date & time: 05/30/17  1300     History   Chief Complaint Chief Complaint  Patient presents with  . Dysuria  . Flank Pain    HPI Kiara Murillo is a 37 y.o. female.  The history is provided by the patient. No language interpreter was used.  Dysuria   Associated symptoms include flank pain.  Flank Pain     Kiara Murillo is a 37 y.o. female who presents to the Emergency Department complaining of back pain.  She reports three days of bilateral flank/back pain that she describes as "kidney pain."  Pain is aching and constant in nature, occasionally sharp and non radiating.  The pain is in bilateral low back with left greater than right.  She has associated urinary frequency and nausea.  No fevers, abdominal pain, vaginal discharge.  Similar sxs in the past nine years ago and dx with pyelonephritis - sxs were worse at that time.  No IVDA.  No numbness/weakness.     Past Medical History:  Diagnosis Date  . Anxiety   . Chondromalacia of patella   . Chronic pain syndrome   . Chronic pelvic pain in female   . Congenital spondylolisthesis   . Congenital spondylolisthesis   . Depression   . Headache(784.0) hemiplegic migraine  . Low back pain   . Lumbar spondylolysis   . Spinal stenosis in cervical region   . Spinal stenosis in cervical region     Patient Active Problem List   Diagnosis Date Noted  . S/P tubal ligation 12/02/2012  . No narcotics to be prescribed by [facility] 01/19/2012  . Severe major depression with psychotic features (HCC) 09/04/2011  . Chronic pain syndrome 09/01/2010  . MIGRAINE, HEMIPLEGIC 11/22/2009  . CHONDROMALACIA OF PATELLA 11/15/2009  . SPONDYLOSIS, LUMBAR 05/16/2009  . LOW BACK PAIN 12/26/2008  . PELVIC PAIN, CHRONIC 11/23/2008  . Post traumatic stress disorder (PTSD) 01/28/2007  . TOBACCO DEPENDENCE 06/17/2006  . CERVICAL SPINE DISORDER, NOS 06/17/2006    Past  Surgical History:  Procedure Laterality Date  . CESAREAN SECTION    . MOUTH SURGERY    . TONSILLECTOMY    . TUBAL LIGATION      OB History    No data available       Home Medications    Prior to Admission medications   Medication Sig Start Date End Date Taking? Authorizing Provider  cephALEXin (KEFLEX) 500 MG capsule Take 1 capsule (500 mg total) by mouth 3 (three) times daily. 05/30/17   Tilden Fossa, MD  oxyCODONE-acetaminophen (PERCOCET) 5-325 MG tablet Take 1 tablet by mouth every 6 (six) hours as needed (for pain). 10/25/16   Molpus, Jonny Ruiz, MD  promethazine (PHENERGAN) 25 MG tablet Take 1 tablet (25 mg total) by mouth every 8 (eight) hours as needed for nausea or vomiting. 05/30/17   Tilden Fossa, MD    Family History No family history on file.  Social History Social History   Tobacco Use  . Smoking status: Current Every Day Smoker    Packs/day: 1.00    Types: Cigarettes  . Smokeless tobacco: Never Used  Substance Use Topics  . Alcohol use: No  . Drug use: No     Allergies   Eggs or egg-derived products; Gabapentin; Hydrocodone; Toradol [ketorolac tromethamine]; Tramadol; and Metronidazole   Review of Systems Review of Systems  Genitourinary: Positive for dysuria and flank pain.  All other systems reviewed  and are negative.    Physical Exam Updated Vital Signs BP (!) 147/93 (BP Location: Right Arm)   Pulse (!) 108   Temp 98.1 F (36.7 C) (Oral)   Resp 18   Ht 5\' 4"  (1.626 m)   Wt 56.7 kg (125 lb)   LMP 05/27/2017   SpO2 100%   BMI 21.46 kg/m   Physical Exam  Constitutional: She is oriented to person, place, and time. She appears well-developed and well-nourished.  HENT:  Head: Normocephalic and atraumatic.  Cardiovascular: Normal rate and regular rhythm.  No murmur heard. Pulmonary/Chest: Effort normal and breath sounds normal. No respiratory distress.  Abdominal: Soft. There is no tenderness. There is no rebound and no guarding.  No cva  tenderness  Musculoskeletal: She exhibits no edema or tenderness.  Neurological: She is alert and oriented to person, place, and time.  Skin: Skin is warm and dry.  Psychiatric: She has a normal mood and affect. Her behavior is normal.  Nursing note and vitals reviewed.    ED Treatments / Results  Labs (all labs ordered are listed, but only abnormal results are displayed) Labs Reviewed  URINALYSIS, ROUTINE W REFLEX MICROSCOPIC - Abnormal; Notable for the following components:      Result Value   APPearance CLOUDY (*)    Hgb urine dipstick TRACE (*)    Ketones, ur 15 (*)    Nitrite POSITIVE (*)    All other components within normal limits  URINALYSIS, MICROSCOPIC (REFLEX) - Abnormal; Notable for the following components:   Bacteria, UA MANY (*)    Squamous Epithelial / LPF 6-30 (*)    All other components within normal limits  BASIC METABOLIC PANEL - Abnormal; Notable for the following components:   Glucose, Bld 149 (*)    All other components within normal limits  URINE CULTURE  PREGNANCY, URINE  CBC WITH DIFFERENTIAL/PLATELET    EKG  EKG Interpretation None       Radiology No results found.  Procedures Procedures (including critical care time)  Medications Ordered in ED Medications  sodium chloride 0.9 % bolus 1,000 mL (0 mLs Intravenous Stopped 05/30/17 1655)  cefTRIAXone (ROCEPHIN) 1 g in dextrose 5 % 50 mL IVPB (0 g Intravenous Stopped 05/30/17 1643)     Initial Impression / Assessment and Plan / ED Course  I have reviewed the triage vital signs and the nursing notes.  Pertinent labs & imaging results that were available during my care of the patient were reviewed by me and considered in my medical decision making (see chart for details).     Patient here for evaluation of flank pain.  She is nontoxic appearing on examination.  UA is consistent with UTI.  Presentation is not consistent with stone, intra-abdominal abscess, epidural abscess, sepsis.  Patient  feels improved after treatment in the emergency department.  Counseled patient on home care for UTI.  Discussed outpatient follow-up and return precautions.  Final Clinical Impressions(s) / ED Diagnoses   Final diagnoses:  Acute UTI    ED Discharge Orders        Ordered    cephALEXin (KEFLEX) 500 MG capsule  3 times daily     05/30/17 1622    promethazine (PHENERGAN) 25 MG tablet  Every 8 hours PRN     05/30/17 1622       Tilden Fossaees, Delylah Stanczyk, MD 05/31/17 [phone]

## 2017-05-30 NOTE — ED Triage Notes (Signed)
Pt reports frequency and back pain x 2 days. ? UTI. Pt states she is on her period now, unsure if she is having vaginal discharge.

## 2017-06-02 LAB — URINE CULTURE: Culture: >=100000 — AB

## 2017-06-03 ENCOUNTER — Telehealth: Payer: Self-pay | Admitting: Emergency Medicine

## 2017-06-03 NOTE — Telephone Encounter (Signed)
Post ED Visit - Positive Culture Follow-up  Culture report reviewed by antimicrobial stewardship pharmacist:  [x]  Enzo BiNathan Batchelder, Pharm.D. []  Celedonio MiyamotoJeremy Frens, Pharm.D., BCPS AQ-ID []  Garvin FilaMike Maccia, Pharm.D., BCPS []  Georgina PillionElizabeth Martin, Pharm.D., BCPS []  BowmoreMinh Pham, [place]arm.D., BCPS, AAHIVP []  Estella HuskMichelle Turner, Pharm.D., BCPS, AAHIVP []  Lysle Pearlachel Rumbarger, PharmD, BCPS []  Blake DivineShannon Parkey, PharmD []  Pollyann SamplesAndy Johnston, PharmD, BCPS  Positive urine culture Treated with cephalexin, organism sensitive to the same and no further patient follow-up is required at this time.  Berle MullMiller, Lajuanna Pompa 06/03/2017, 1:14 PM

## 2017-06-04 ENCOUNTER — Encounter (HOSPITAL_COMMUNITY): Payer: Self-pay | Admitting: Nurse Practitioner

## 2017-06-04 ENCOUNTER — Emergency Department (HOSPITAL_COMMUNITY)
Admission: EM | Admit: 2017-06-04 | Discharge: 2017-06-05 | Disposition: A | Discharge status: Home / Self Care | Payer: Self-pay | Attending: Emergency Medicine | Admitting: Emergency Medicine

## 2017-06-04 DIAGNOSIS — S51812A Laceration without foreign body of left forearm, initial encounter: Secondary | ICD-10-CM | POA: Insufficient documentation

## 2017-06-04 DIAGNOSIS — W208XXA Other cause of strike by thrown, projected or falling object, initial encounter: Secondary | ICD-10-CM | POA: Insufficient documentation

## 2017-06-04 DIAGNOSIS — Y9389 Activity, other specified: Secondary | ICD-10-CM | POA: Insufficient documentation

## 2017-06-04 DIAGNOSIS — F17210 Nicotine dependence, cigarettes, uncomplicated: Secondary | ICD-10-CM | POA: Insufficient documentation

## 2017-06-04 DIAGNOSIS — Y999 Unspecified external cause status: Secondary | ICD-10-CM | POA: Insufficient documentation

## 2017-06-04 DIAGNOSIS — Y929 Unspecified place or not applicable: Secondary | ICD-10-CM | POA: Insufficient documentation

## 2017-06-04 MED ORDER — OXYCODONE-ACETAMINOPHEN 5-325 MG PO TABS
1.0000 | ORAL_TABLET | Freq: Once | ORAL | Status: AC
  Administered 2017-06-04: 1 via ORAL
  Filled 2017-06-04: qty 1

## 2017-06-04 MED ORDER — LIDOCAINE-EPINEPHRINE (PF) 2 %-1:200000 IJ SOLN
10.0000 mL | Freq: Once | INTRAMUSCULAR | Status: AC
  Administered 2017-06-05: 10 mL via INTRADERMAL
  Filled 2017-06-04: qty 20

## 2017-06-04 NOTE — ED Provider Notes (Signed)
[facility] Provider Note   CSN: [id number] Arrival date & time: 06/04/17  1837     History   Chief Complaint Chief Complaint  Patient presents with  . Extremity Laceration    Left  forearm    HPI Kiara Murillo is a 37 y.o. female.  HPI  Kiara Murillo is a 37 y.o. female presents to emergency department complaining of laceration to her left forearm.  Patient states that she was moving furniture and had a glass picture frame that fell and caught her left forearm.  Sent with a gaping laceration to the left forearm, states this happened early this morning.  She states she was afraid to come to emergency department because she is afraid of sutures.  Tetanus is up-to-date.  No numbness or weakness distally to the wrist or hand.  Denies self-inflicted wound.  No SI or HI.   Past Medical History:  Diagnosis Date  . Anxiety   . Chondromalacia of patella   . Chronic pain syndrome   . Chronic pelvic pain in female   . Congenital spondylolisthesis   . Congenital spondylolisthesis   . Depression   . Headache(784.0) hemiplegic migraine  . Low back pain   . Lumbar spondylolysis   . Spinal stenosis in cervical region   . Spinal stenosis in cervical region     Patient Active Problem List   Diagnosis Date Noted  . S/P tubal ligation 12/02/2012  . No narcotics to be prescribed by [facility] 01/19/2012  . Severe major depression with psychotic features (HCC) 09/04/2011  . Chronic pain syndrome 09/01/2010  . MIGRAINE, HEMIPLEGIC 11/22/2009  . CHONDROMALACIA OF PATELLA 11/15/2009  . SPONDYLOSIS, LUMBAR 05/16/2009  . LOW BACK PAIN 12/26/2008  . PELVIC PAIN, CHRONIC 11/23/2008  . Post traumatic stress disorder (PTSD) 01/28/2007  . TOBACCO DEPENDENCE 06/17/2006  . CERVICAL SPINE DISORDER, NOS 06/17/2006    Past Surgical History:  Procedure Laterality Date  . CESAREAN SECTION    . MOUTH SURGERY    . TONSILLECTOMY    . TUBAL LIGATION      OB History    No data available       Home Medications    Prior to Admission medications   Medication Sig Start Date End Date Taking? Authorizing Provider  cephALEXin (KEFLEX) 500 MG capsule Take 1 capsule (500 mg total) by mouth 3 (three) times daily. 05/30/17   Tilden Fossa, MD  oxyCODONE-acetaminophen (PERCOCET) 5-325 MG tablet Take 1 tablet by mouth every 6 (six) hours as needed (for pain). 10/25/16   Molpus, Jonny Ruiz, MD  promethazine (PHENERGAN) 25 MG tablet Take 1 tablet (25 mg total) by mouth every 8 (eight) hours as needed for nausea or vomiting. 05/30/17   Tilden Fossa, MD    Family History History reviewed. No pertinent family history.  Social History Social History   Tobacco Use  . Smoking status: Current Every Day Smoker    Packs/day: 1.00    Types: Cigarettes  . Smokeless tobacco: Never Used  Substance Use Topics  . Alcohol use: No  . Drug use: No     Allergies   Eggs or egg-derived products; Gabapentin; Hydrocodone; Toradol [ketorolac tromethamine]; Tramadol; and Metronidazole   Review of Systems Review of Systems  Skin: Positive for wound.  Neurological: Negative for weakness and numbness.  Psychiatric/Behavioral: Negative for self-injury and suicidal ideas.     Physical Exam Updated Vital Signs BP 124/79   Pulse 93   Temp 98.7 F (37.1  C) (Oral)   Resp 19   LMP 05/27/2017   SpO2 100%   Physical Exam  Constitutional: She appears well-developed and well-nourished. No distress.  Eyes: Conjunctivae are normal.  Neck: Neck supple.  Musculoskeletal:  8 cm laceration to the anterior mid left forearm, gaping, does not penetrate through the fascia.  Full range of motion of all fingers and wrist, with good strength against resistance.  Distal radial pulses intact.  Capillary refill less than 2 seconds distally.  Neurological: She is alert.  Skin: Skin is warm and dry.  Nursing note and vitals reviewed.    ED Treatments / Results  Labs (all labs ordered are  listed, but only abnormal results are displayed) Labs Reviewed - No data to display  EKG  EKG Interpretation None       Radiology No results found.  Procedures .Marland Kitchen.Laceration Repair Date/Time: 06/05/2017 12:22 AM Performed by: Jaynie CrumbleKirichenko, Aydian Dimmick, PA-C Authorized by: Jaynie CrumbleKirichenko, Rece Zechman, PA-C   Consent:    Consent obtained:  Verbal   Consent given by:  Patient   Risks discussed:  Infection, pain, poor cosmetic result, need for additional repair and poor wound healing   Alternatives discussed:  No treatment and delayed treatment Anesthesia (see MAR for exact dosages):    Anesthesia method:  Local infiltration   Local anesthetic:  Lidocaine 2% WITH epi Laceration details:    Location:  Shoulder/arm   Shoulder/arm location:  L lower arm   Length (cm):  8 Repair type:    Repair type:  Simple Pre-procedure details:    Preparation:  Patient was prepped and draped in usual sterile fashion Exploration:    Hemostasis achieved with:  Direct pressure   Wound extent: no fascia violation noted and no muscle damage noted   Treatment:    Area cleansed with:  Betadine and saline   Amount of cleaning:  Standard   Irrigation solution:  Sterile saline   Irrigation method:  Syringe   Visualized foreign bodies/material removed: no   Skin repair:    Repair method:  Sutures   Suture size:  4-0   Suture material:  Prolene   Suture technique:  Simple interrupted   Number of sutures:  8 Approximation:    Approximation:  Close   Vermilion border: well-aligned   Post-procedure details:    Dressing:  Antibiotic ointment and non-adherent dressing   Patient tolerance of procedure:  Tolerated well, no immediate complications   (including critical care time)  Medications Ordered in ED Medications  lidocaine-EPINEPHrine (XYLOCAINE W/EPI) 2 %-1:200000 (PF) injection 10 mL (not administered)  oxyCODONE-acetaminophen (PERCOCET/ROXICET) 5-325 MG per tablet 1 tablet (1 tablet Oral Given 06/04/17  2020)     Initial Impression / Assessment and Plan / ED Course  I have reviewed the triage vital signs and the nursing notes.  Pertinent labs & imaging results that were available during my care of the patient were reviewed by me and considered in my medical decision making (see chart for details).     Patient with superficial but gaping laceration to the left forearm.  Laceration suspicious for self inflicted wound, however patient denies.  She denies SI or HI.  Family member at bedside, do not express any concern for self-harm.  Laceration repaired after extensive irrigation due to being almost a day old.  Laceration repaired with sutures.  Tetanus is up-to-date.  Discussed wound care at home.  Will have a follow-up in 10 days for suture removal.  Watch for any signs of infection.  Vitals:   06/04/17 1845 06/04/17 2334  BP: 132/81 124/79  Pulse: 83 93  Resp: 20 19  Temp: 98.1 F (36.7 C) 98.7 F (37.1 C)  TempSrc: Oral Oral  SpO2: 100% 100%    Final Clinical Impressions(s) / ED Diagnoses   Final diagnoses:  Laceration of left forearm, initial encounter    ED Discharge Orders    None       Jaynie Crumble, PA-C 06/05/17 0033    Charlynne Pander, MD 06/07/17 [phone]

## 2017-06-04 NOTE — ED Triage Notes (Signed)
Pt presents with left arm laceration that she reports she sustained from a glass pitcher.

## 2017-06-05 MED ORDER — BACITRACIN ZINC 500 UNIT/GM EX OINT
TOPICAL_OINTMENT | Freq: Once | CUTANEOUS | Status: AC
  Administered 2017-06-05: 1 via TOPICAL
  Filled 2017-06-05: qty 0.9

## 2017-06-05 NOTE — ED Notes (Signed)
Dressing applied to laceration on the LAFA

## 2017-06-05 NOTE — Discharge Instructions (Signed)
Keep wound clean and dry. Bacitracin twice a day. Follow up in 10 days for suture removal. Watch for sings of infection. Tylenol or motrin for pain.

## 2018-02-14 ENCOUNTER — Emergency Department (HOSPITAL_BASED_OUTPATIENT_CLINIC_OR_DEPARTMENT_OTHER)
Admission: EM | Admit: 2018-02-14 | Discharge: 2018-02-14 | Disposition: A | Discharge status: Home / Self Care | Payer: Medicaid Other | Attending: Emergency Medicine | Admitting: Emergency Medicine

## 2018-02-14 ENCOUNTER — Encounter (HOSPITAL_BASED_OUTPATIENT_CLINIC_OR_DEPARTMENT_OTHER): Payer: Self-pay | Admitting: Emergency Medicine

## 2018-02-14 ENCOUNTER — Other Ambulatory Visit: Payer: Self-pay

## 2018-02-14 DIAGNOSIS — H6122 Impacted cerumen, left ear: Secondary | ICD-10-CM | POA: Insufficient documentation

## 2018-02-14 DIAGNOSIS — F17210 Nicotine dependence, cigarettes, uncomplicated: Secondary | ICD-10-CM | POA: Insufficient documentation

## 2018-02-14 DIAGNOSIS — Z79899 Other long term (current) drug therapy: Secondary | ICD-10-CM | POA: Insufficient documentation

## 2018-02-14 MED ORDER — DOCUSATE SODIUM 50 MG/5ML PO LIQD
50.0000 mg | Freq: Once | ORAL | Status: AC
Start: 2018-02-14 — End: 2018-02-14
  Administered 2018-02-14: 50 mg via OTIC
  Filled 2018-02-14: qty 10

## 2018-02-14 NOTE — ED Triage Notes (Signed)
Patient reports left ear pain x 4 days.

## 2018-02-14 NOTE — ED Provider Notes (Signed)
[facility] EMERGENCY DEPARTMENT Provider Note   CSN: [id number] Arrival date & time: 02/14/18  1726     History   Chief Complaint Chief Complaint  Patient presents with  . Otalgia    HPI Kiara Murillo is a 37 y.o. female who presents the emergency department with chief complaint of feeling like her left ear is stopped up.  Patient states is been gone ongoing for the past several days.  She is never had this sensation before.  She denies any pain.  There is fever, chills, neck stiffness, photophobia or rash.  HPI  Past Medical History:  Diagnosis Date  . Anxiety   . Chondromalacia of patella   . Chronic pain syndrome   . Chronic pelvic pain in female   . Congenital spondylolisthesis   . Congenital spondylolisthesis   . Depression   . Headache(784.0) hemiplegic migraine  . Low back pain   . Lumbar spondylolysis   . Spinal stenosis in cervical region   . Spinal stenosis in cervical region     Patient Active Problem List   Diagnosis Date Noted  . S/P tubal ligation 12/02/2012  . No narcotics to be prescribed by [facility] 01/19/2012  . Severe major depression with psychotic features (HCC) 09/04/2011  . Chronic pain syndrome 09/01/2010  . MIGRAINE, HEMIPLEGIC 11/22/2009  . CHONDROMALACIA OF PATELLA 11/15/2009  . SPONDYLOSIS, LUMBAR 05/16/2009  . LOW BACK PAIN 12/26/2008  . PELVIC PAIN, CHRONIC 11/23/2008  . Post traumatic stress disorder (PTSD) 01/28/2007  . TOBACCO DEPENDENCE 06/17/2006  . CERVICAL SPINE DISORDER, NOS 06/17/2006    Past Surgical History:  Procedure Laterality Date  . CESAREAN SECTION    . MOUTH SURGERY    . TONSILLECTOMY    . TUBAL LIGATION       OB History   None      Home Medications    Prior to Admission medications   Medication Sig Start Date End Date Taking? Authorizing Provider  sertraline (ZOLOFT) 100 MG tablet Take 100 mg by mouth daily.   Yes [provider]  cephALEXin (KEFLEX) 500 MG capsule Take 1 capsule  (500 mg total) by mouth 3 (three) times daily. 05/30/17   Tilden Fossa, MD  oxyCODONE-acetaminophen (PERCOCET) 5-325 MG tablet Take 1 tablet by mouth every 6 (six) hours as needed (for pain). 10/25/16   Molpus, Jonny Ruiz, MD  promethazine (PHENERGAN) 25 MG tablet Take 1 tablet (25 mg total) by mouth every 8 (eight) hours as needed for nausea or vomiting. 05/30/17   Tilden Fossa, MD    Family History History reviewed. No pertinent family history.  Social History Social History   Tobacco Use  . Smoking status: Current Every Day Smoker    Packs/day: 1.00    Types: Cigarettes  . Smokeless tobacco: Never Used  Substance Use Topics  . Alcohol use: No  . Drug use: No     Allergies   Eggs or egg-derived products; Gabapentin; Hydrocodone; Toradol [ketorolac tromethamine]; Tramadol; and Metronidazole   Review of Systems Review of Systems  Constitutional: Negative for chills and fever.  HENT: Negative for ear discharge and ear pain.   Eyes: Negative for photophobia and pain.  Skin: Negative for rash.  Neurological: Negative for headaches.     Physical Exam Updated Vital Signs BP 119/88   Pulse (!) 101   Temp 98.4 F (36.9 C) (Oral)   Resp 20   Ht 5\' 4"  (1.626 m)   Wt 56.7 kg   LMP 01/18/2018 (Approximate)  SpO2 98%   BMI 21.46 kg/m   Physical Exam  Constitutional: She is oriented to person, place, and time. She appears well-developed and well-nourished. No distress.  HENT:  Head: Normocephalic and atraumatic.  Right Ear: Hearing, tympanic membrane, external ear and ear canal normal.  Left Ear: External ear normal. Decreased hearing is noted.  Ears:  Eyes: Pupils are equal, round, and reactive to light. Conjunctivae and EOM are normal. No scleral icterus.  Neck: Normal range of motion.  Cardiovascular: Normal rate, regular rhythm and normal heart sounds. Exam reveals no gallop and no friction rub.  No murmur heard. Pulmonary/Chest: Effort normal and breath sounds  normal. No respiratory distress.  Abdominal: Soft. Bowel sounds are normal. She exhibits no distension and no mass. There is no tenderness. There is no guarding.  Neurological: She is alert and oriented to person, place, and time.  Skin: Skin is warm and dry. Capillary refill takes less than 2 seconds. She is not diaphoretic.  Psychiatric: Her behavior is normal.  Nursing note and vitals reviewed.    ED Treatments / Results  Labs (all labs ordered are listed, but only abnormal results are displayed) Labs Reviewed - No data to display  EKG None  Radiology No results found.  Procedures .Ear Cerumen Removal Date/Time: 02/14/2018 7:10 PM Performed by: Arthor Captain, PA-C Authorized by: Arthor Captain, PA-C   Consent:    Consent obtained:  Verbal   Consent given by:  Patient   Risks discussed:  Bleeding, dizziness, infection, incomplete removal, TM perforation and pain   Alternatives discussed:  No treatment and delayed treatment Procedure details:    Location:  L ear   Procedure type: irrigation   Post-procedure details:    Inspection:  Macerated skin and TM intact   Hearing quality:  Improved   Patient tolerance of procedure:  Tolerated well, no immediate complications   (including critical care time)  Medications Ordered in ED Medications  docusate (COLACE) 50 MG/5ML liquid 50 mg (has no administration in time range)     Initial Impression / Assessment and Plan / ED Course  I have reviewed the triage vital signs and the nursing notes.  Pertinent labs & imaging results that were available during my care of the patient were reviewed by me and considered in my medical decision making (see chart for details).     She with cerumen impaction on the left side. Complete cerumen irrigation and removal achieved.  Patient's hearing greatly improved.  Patient tolerated the procedure well.  No bleeding or TM perforation noted.  Patient appears appropriate for discharge at  this time with home care instructions.  Discussed return precautions Final Clinical Impressions(s) / ED Diagnoses   Final diagnoses:  None    ED Discharge Orders    None       Arthor Captain, PA-C 02/14/18 1911    Jacalyn Lefevre, MD 02/14/18 2328

## 2018-02-14 NOTE — Discharge Instructions (Addendum)
Seek medical care if: Your hearing is not improving or is getting worse. You have pain or redness in your ear. You have fluid, blood, or pus coming out of your ear.

## 2018-05-25 ENCOUNTER — Emergency Department (HOSPITAL_COMMUNITY)
Admission: EM | Admit: 2018-05-25 | Discharge: 2018-05-26 | Disposition: A | Discharge status: Other Acute Inpatient Hospital | Payer: Medicaid Other | Attending: Emergency Medicine | Admitting: Emergency Medicine

## 2018-05-25 ENCOUNTER — Encounter (HOSPITAL_COMMUNITY): Payer: Self-pay

## 2018-05-25 DIAGNOSIS — F17210 Nicotine dependence, cigarettes, uncomplicated: Secondary | ICD-10-CM | POA: Insufficient documentation

## 2018-05-25 DIAGNOSIS — R45851 Suicidal ideations: Secondary | ICD-10-CM

## 2018-05-25 DIAGNOSIS — T50901A Poisoning by unspecified drugs, medicaments and biological substances, accidental (unintentional), initial encounter: Secondary | ICD-10-CM

## 2018-05-25 DIAGNOSIS — T428X4A Poisoning by antiparkinsonism drugs and other central muscle-tone depressants, undetermined, initial encounter: Secondary | ICD-10-CM | POA: Insufficient documentation

## 2018-05-25 DIAGNOSIS — F332 Major depressive disorder, recurrent severe without psychotic features: Secondary | ICD-10-CM | POA: Insufficient documentation

## 2018-05-25 DIAGNOSIS — Z79899 Other long term (current) drug therapy: Secondary | ICD-10-CM | POA: Insufficient documentation

## 2018-05-25 DIAGNOSIS — Z008 Encounter for other general examination: Secondary | ICD-10-CM | POA: Insufficient documentation

## 2018-05-25 DIAGNOSIS — F1220 Cannabis dependence, uncomplicated: Secondary | ICD-10-CM | POA: Insufficient documentation

## 2018-05-25 LAB — CBC WITH DIFFERENTIAL/PLATELET
Abs Immature Granulocytes: 0.05 10*3/uL (ref 0.00–0.07)
Basophils Absolute: 0 10*3/uL (ref 0.0–0.1)
Basophils Relative: 0 %
Eosinophils Absolute: 0 10*3/uL (ref 0.0–0.5)
Eosinophils Relative: 0 %
HCT: 40.2 % (ref 36.0–46.0)
Hemoglobin: 12.9 g/dL (ref 12.0–15.0)
Immature Granulocytes: 0 %
Lymphocytes Relative: 21 %
Lymphs Abs: 2.5 10*3/uL (ref 0.7–4.0)
MCH: 31.6 pg (ref 26.0–34.0)
MCHC: 32.1 g/dL (ref 30.0–36.0)
MCV: 98.5 fL (ref 80.0–100.0)
Monocytes Absolute: 0.5 10*3/uL (ref 0.1–1.0)
Monocytes Relative: 4 %
Neutro Abs: 8.8 10*3/uL — ABNORMAL HIGH (ref 1.7–7.7)
Neutrophils Relative %: 75 %
Platelets: 225 10*3/uL (ref 150–400)
RBC: 4.08 MIL/uL (ref 3.87–5.11)
RDW: 12.5 % (ref 11.5–15.5)
WBC: 11.9 10*3/uL — ABNORMAL HIGH (ref 4.0–10.5)
nRBC: 0 % (ref 0.0–0.2)

## 2018-05-25 LAB — COMPREHENSIVE METABOLIC PANEL
ALT: 18 U/L (ref 0–44)
AST: 26 U/L (ref 15–41)
Albumin: 4.4 g/dL (ref 3.5–5.0)
Alkaline Phosphatase: 75 U/L (ref 38–126)
Anion gap: 7 (ref 5–15)
BUN: 6 mg/dL (ref 6–20)
CO2: 22 mmol/L (ref 22–32)
Calcium: 8.7 mg/dL — ABNORMAL LOW (ref 8.9–10.3)
Chloride: 108 mmol/L (ref 98–111)
Creatinine, Ser: 0.89 mg/dL (ref 0.44–1.00)
GFR calc Af Amer: >60 mL/min (ref >60)
GFR calc non Af Amer: >60 mL/min (ref >60)
Glucose, Bld: 92 mg/dL (ref 70–99)
Potassium: 3.2 mmol/L — ABNORMAL LOW (ref 3.5–5.1)
Sodium: 137 mmol/L (ref 135–145)
Total Bilirubin: 0.2 mg/dL — ABNORMAL LOW (ref 0.3–1.2)
Total Protein: 7.3 g/dL (ref 6.5–8.1)

## 2018-05-25 LAB — ACETAMINOPHEN LEVEL: Acetaminophen (Tylenol), Serum: <10 ug/mL — ABNORMAL LOW (ref 10–30)

## 2018-05-25 LAB — SALICYLATE LEVEL: Salicylate Lvl: <7 mg/dL (ref 2.8–30.0)

## 2018-05-25 LAB — ETHANOL: Alcohol, Ethyl (B): <10 mg/dL (ref <10)

## 2018-05-25 MED ORDER — SODIUM CHLORIDE 0.9 % IV BOLUS
500.0000 mL | Freq: Once | INTRAVENOUS | Status: AC
  Administered 2018-05-25: 500 mL via INTRAVENOUS

## 2018-05-25 MED ORDER — SODIUM CHLORIDE 0.9 % IV SOLN
INTRAVENOUS | Status: DC
  Administered 2018-05-25: 19:00:00 via INTRAVENOUS

## 2018-05-25 NOTE — ED Notes (Signed)
Pt aware that urine sample is needed. Unable to give urine at this time

## 2018-05-25 NOTE — ED Notes (Addendum)
Pt changed into burgandy scrubs, all belongings taken/ labeled and placed into appropriate cabinet. "16-18, RES A" on the top shelf. Pt IVCed by Effie Shy, MD

## 2018-05-25 NOTE — ED Provider Notes (Addendum)
[facility]-EMERGENCY DEPT Provider Note   CSN: [id number] Arrival date & time: 05/25/18  1851     History   Chief Complaint Chief Complaint  Patient presents with  . Drug Overdose    HPI Kiara Murillo is a 38 y.o. female.  HPI   Patient arrives for evaluation of possible overdose.  Apparently someone, possibly her fianc, called EMS to report an overdose.  There is a suspicion that she took up to 45 Soma tablets.  The patient denies taking an overdose.  The patient is a poor historian.  The patient is hard of hearing, and has difficulty hearing some questions.  Level 5 caveat-altered mental status  Past Medical History:  Diagnosis Date  . Anxiety   . Chondromalacia of patella   . Chronic pain syndrome   . Chronic pelvic pain in female   . Congenital spondylolisthesis   . Congenital spondylolisthesis   . Depression   . Headache(784.0) hemiplegic migraine  . Low back pain   . Lumbar spondylolysis   . Spinal stenosis in cervical region   . Spinal stenosis in cervical region     Patient Active Problem List   Diagnosis Date Noted  . S/P tubal ligation 12/02/2012  . No narcotics to be prescribed by [facility] 01/19/2012  . Severe major depression with psychotic features (HCC) 09/04/2011  . Chronic pain syndrome 09/01/2010  . MIGRAINE, HEMIPLEGIC 11/22/2009  . CHONDROMALACIA OF PATELLA 11/15/2009  . SPONDYLOSIS, LUMBAR 05/16/2009  . LOW BACK PAIN 12/26/2008  . PELVIC PAIN, CHRONIC 11/23/2008  . Post traumatic stress disorder (PTSD) 01/28/2007  . TOBACCO DEPENDENCE 06/17/2006  . CERVICAL SPINE DISORDER, NOS 06/17/2006    Past Surgical History:  Procedure Laterality Date  . CESAREAN SECTION    . MOUTH SURGERY    . TONSILLECTOMY    . TUBAL LIGATION       OB History   No obstetric history on file.      Home Medications    Prior to Admission medications   Medication Sig Start Date End Date Taking? Authorizing Provider  ALPRAZolam Prudy Feeler) 1 MG  tablet Take 1 mg by mouth 4 (four) times daily.  01/08/18  Yes [provider]  clindamycin (CLEOCIN) 300 MG capsule Take 300 mg by mouth 3 (three) times daily.   Yes [provider]  ibuprofen (ADVIL,MOTRIN) 200 MG tablet Take 800 mg by mouth 2 (two) times daily as needed for moderate pain.   Yes [provider]  sertraline (ZOLOFT) 100 MG tablet Take 100 mg by mouth daily.   Yes [provider]  cephALEXin (KEFLEX) 500 MG capsule Take 1 capsule (500 mg total) by mouth 3 (three) times daily. Patient not taking: Reported on 05/25/2018 05/30/17   Tilden Fossa, MD  oxyCODONE-acetaminophen (PERCOCET) 5-325 MG tablet Take 1 tablet by mouth every 6 (six) hours as needed (for pain). Patient not taking: Reported on 05/25/2018 10/25/16   Molpus, Jonny Ruiz, MD  promethazine (PHENERGAN) 25 MG tablet Take 1 tablet (25 mg total) by mouth every 8 (eight) hours as needed for nausea or vomiting. Patient not taking: Reported on 05/25/2018 05/30/17   Tilden Fossa, MD    Family History History reviewed. No pertinent family history.  Social History Social History   Tobacco Use  . Smoking status: Current Every Day Smoker    Packs/day: 1.00    Types: Cigarettes  . Smokeless tobacco: Never Used  Substance Use Topics  . Alcohol use: No  . Drug use:  No     Allergies   Hydrocodone-acetaminophen; Eggs or egg-derived products; Gabapentin; Hydrocodone; Toradol [ketorolac tromethamine]; Tramadol; and Metronidazole   Review of Systems Review of Systems  Unable to perform ROS: Mental status change     Physical Exam Updated Vital Signs BP 101/69   Pulse 62   Temp 98.1 F (36.7 C) (Oral)   Resp (!) 24   SpO2 100%   Physical Exam Vitals signs and nursing note reviewed.  Constitutional:      General: She is not in acute distress.    Appearance: She is well-developed and normal weight. She is not ill-appearing, toxic-appearing or diaphoretic.  HENT:     Head: Normocephalic  and atraumatic.     Right Ear: External ear normal.     Left Ear: External ear normal.  Eyes:     Conjunctiva/sclera: Conjunctivae normal.     Pupils: Pupils are equal, round, and reactive to light.  Neck:     Musculoskeletal: Normal range of motion and neck supple.     Trachea: Phonation normal.  Cardiovascular:     Rate and Rhythm: Normal rate and regular rhythm.     Heart sounds: Normal heart sounds. No murmur.  Pulmonary:     Effort: Pulmonary effort is normal. No respiratory distress.     Breath sounds: Normal breath sounds. No stridor.  Chest:     Chest wall: No tenderness.  Abdominal:     General: There is no distension.     Palpations: Abdomen is soft. There is no mass.     Tenderness: There is no abdominal tenderness.  Musculoskeletal: Normal range of motion.        General: No tenderness.  Skin:    General: Skin is warm and dry.     Comments: No visible injuries  Neurological:     Mental Status: She is alert and oriented to person, place, and time.     Cranial Nerves: No cranial nerve deficit.     Sensory: No sensory deficit.     Motor: No abnormal muscle tone.     Coordination: Coordination normal.  Psychiatric:        Attention and Perception: She is inattentive. She does not perceive auditory or visual hallucinations.        Mood and Affect: Mood is depressed. Mood is not anxious or elated. Affect is flat. Affect is not angry.        Speech: Speech is delayed and slurred.        Behavior: Behavior is slowed and withdrawn. Behavior is not agitated, aggressive, hyperactive or combative.        Thought Content: Thought content is not paranoid or delusional. Thought content does not include suicidal ideation. Thought content does not include suicidal plan.        Cognition and Memory: Cognition is impaired. She exhibits impaired recent memory.        Judgment: Judgment is not impulsive or inappropriate.      ED Treatments / Results  Labs (all labs ordered are  listed, but only abnormal results are displayed) Labs Reviewed  COMPREHENSIVE METABOLIC PANEL - Abnormal; Notable for the following components:      Result Value   Potassium 3.2 (*)    Calcium 8.7 (*)    Total Bilirubin 0.2 (*)    All other components within normal limits  CBC WITH DIFFERENTIAL/PLATELET - Abnormal; Notable for the following components:   WBC 11.9 (*)    Neutro Abs 8.8 (*)  All other components within normal limits  ACETAMINOPHEN LEVEL - Abnormal; Notable for the following components:   Acetaminophen (Tylenol), Serum <10 (*)    All other components within normal limits  SALICYLATE LEVEL  ETHANOL  RAPID URINE DRUG SCREEN, HOSP PERFORMED    EKG EKG Interpretation  Date/Time:  Wednesday May 25 2018 19:16:18 EST Ventricular Rate:  91 PR Interval:    QRS Duration: 93 QT Interval:  356 QTC Calculation: 438 R Axis:   90 Text Interpretation:  Sinus rhythm Ventricular premature complex Consider right ventricular hypertrophy Baseline wander in lead(s) V2 Partial missing lead(s): V2 No old tracing to compare Confirmed by Mancel BaleWentz, Franko Hilliker [phone]([id number]) on 05/25/2018 8:58:27 PM   Radiology No results found.  Procedures .Critical Care Performed by: Mancel BaleWentz, Onyx Schirmer, MD Authorized by: Mancel BaleWentz, Izzie Geers, MD   Critical care provider statement:    Critical care time (minutes):  35   Critical care start time:  05/25/2018 7:00 PM   Critical care end time:  05/25/2018 11:15 PM   Critical care time was exclusive of:  Separately billable procedures and treating other patients   Critical care was necessary to treat or prevent imminent or life-threatening deterioration of the following conditions:  Toxidrome   Critical care was time spent personally by me on the following activities:  Blood draw for specimens, development of treatment plan with patient or surrogate, discussions with consultants, evaluation of patient's response to treatment, examination of patient, obtaining history from  patient or surrogate, ordering and performing treatments and interventions, ordering and review of laboratory studies, pulse oximetry, re-evaluation of patient's condition, review of old charts and ordering and review of radiographic studies   (including critical care time)  Medications Ordered in ED Medications  0.9 %  sodium chloride infusion ( Intravenous New Bag/Given 05/25/18 1926)  sodium chloride 0.9 % bolus 500 mL (0 mLs Intravenous Stopped 05/25/18 2038)     Initial Impression / Assessment and Plan / ED Course  I have reviewed the triage vital signs and the nursing notes.  Pertinent labs & imaging results that were available during my care of the patient were reviewed by me and considered in my medical decision making (see chart for details).  Clinical Course as of May 25 2341  Wed May 25, 2018  2057 Normal  Alcohol, Ethyl (B): <10 [EW]  2057 Normal  Acetaminophen level(!) [EW]  2057 Normal except white count elevated  CBC with Differential(!) [EW]  2057 Normal  Salicylate level [EW]  2057 Normal except potassium low, also mild  Comprehensive metabolic panel(!) [EW]  2341 Patient began to remove monitoring appliances, and stated that she would leave.  I was unable to convince her to stay so I completed involuntary commitment and first opinion paperwork.   [EW]    Clinical Course User Index [EW] Mancel BaleWentz, Amyiah Gaba, MD     Patient Vitals for the past 24 hrs:  BP Temp Temp src Pulse Resp SpO2  05/25/18 2314 101/69 - - 62 (!) 24 100 %  05/25/18 2141 93/68 - - 69 18 100 %  05/25/18 2029 112/82 - - 79 18 100 %  05/25/18 1945 102/68 - - 85 (!) 24 100 %  05/25/18 1902 104/70 - - 88 11 100 %  05/25/18 1901 104/70 98.1 F (36.7 C) Oral 96 15 100 %  05/25/18 1859 - - - - - 98 %    8:58 PM Reevaluation with update and discussion. After initial assessment and treatment, an updated evaluation reveals at this  time the patient is sitting up, alert, and brighter than when initially  seen on arrival.  Currently her sister and her husband are here with her.  Husband's name is Verdon Cummins and he spoke with the charge nurse, earlier.  That documentation is reviewed.  Currently the husband, Verdon Cummins, and the patient both state that she is not suicidal, and does not have plans to harm herself.  The patient states that she has too much to live for with her 4 children.  The patient states she uses Soma, "to get high."  She states that sometimes she takes too much.  The patient and her husband Verdon Cummins are not currently living together and this is an amicable decision.  Both the patient and her husband deny that there is any stress between them or significant stress in the patient's life, at this time.  Patient is agreeable to being monitored, for the duration of time recommended by poison control.  This will require an observation until 3 AM, tomorrow morning.  Patient is at risk for seizures, confusion, vomiting, and altered vision. Mancel Bale   Medical Decision Making: Overdose, inadvertent, related to desire to be intoxicated.  Initially this did not appear to be an overt suicidal gesture, or attempt.  Patient evaluated with medical screening, and psychiatric assessment requested.  The patient refused to complete medical straining monitoring, so she was involuntarily committed.  CRITICAL CARE-yes Performed by: Mancel Bale  TTS consultation  Nursing Notes Reviewed/ Care Coordinated Applicable Imaging Reviewed Interpretation of Laboratory Data incorporated into ED treatment   Final Clinical Impressions(s) / ED Diagnoses   Final diagnoses:  Accidental drug overdose, initial encounter  Suicidal ideation    ED Discharge Orders    None          Mancel Bale, MD 05/25/18 2344

## 2018-05-25 NOTE — ED Notes (Addendum)
Pt stated they she is feeling better and would like to go home. MD aware and will speak to pt

## 2018-05-25 NOTE — ED Notes (Signed)
POISON CONTROL CONTACTED.Marland Kitchen.  -watch for s/s: n/v/d, confusion, muscle weakness, blurred vision, seizure -recommend: supportive care, ekg, tylenol level at 2130, if seizure give benzos, observe pt for 8 hours

## 2018-05-25 NOTE — ED Notes (Addendum)
Conversation with Brayton Caves ex significant other who states he is missing forty pill from his Rx of the medicaton that Mrs Wombacher ingested and that during his conversation with her she admitted to thoughts of not wanting to live. Brayton Caves can be contacted at [phone] if further information is needed. This Clinical research associate will call poison control with update of amount ingested Spoke with Trinna Post and reviewed plan she stated no change from previously discussed plan.

## 2018-05-25 NOTE — ED Notes (Signed)
Pt requesting to speak to TTS sooner because she does not want to stay here longer than she has to.

## 2018-05-25 NOTE — ED Notes (Signed)
Patient refusing IV and blood work at this time. Provider wentz,MD at bedside.

## 2018-05-25 NOTE — ED Notes (Signed)
Pt able to answer all questions appropriately; however, there is a slight pause with each answer. Pt stated she is in 8/10 pain but unable to tell me where at

## 2018-05-25 NOTE — ED Triage Notes (Addendum)
Patient arrived via [facility] from home.   Around 5:30pm over a 2 hour period took between 20-25 carisoprodol 350 mg (muscle relaxer). Patient states she did this to feel better but denies SI. Patient states she did not do this to hurt herself at all.  Friend states patient has had some stressors this past month related to relationship issues.   Hx. Psychiatric per friend.   Denies any other drugs or alcohol today.   Slow to respond but A/Ox4.   45 pills from bottle.  Refused taking all of those but no explanation of where the other 20-25 are

## 2018-05-26 ENCOUNTER — Other Ambulatory Visit: Payer: Self-pay

## 2018-05-26 ENCOUNTER — Encounter (HOSPITAL_COMMUNITY): Payer: Self-pay

## 2018-05-26 ENCOUNTER — Inpatient Hospital Stay (HOSPITAL_COMMUNITY)
Admission: AD | Admit: 2018-05-26 | Discharge: 2018-05-29 | DRG: 885 | Disposition: A | Discharge status: Home / Self Care | Payer: Federal, State, Local not specified - Other | Source: Intra-hospital | Attending: Psychiatry | Admitting: Psychiatry

## 2018-05-26 DIAGNOSIS — Q762 Congenital spondylolisthesis: Secondary | ICD-10-CM | POA: Diagnosis not present

## 2018-05-26 DIAGNOSIS — Z915 Personal history of self-harm: Secondary | ICD-10-CM | POA: Diagnosis not present

## 2018-05-26 DIAGNOSIS — M4802 Spinal stenosis, cervical region: Secondary | ICD-10-CM | POA: Diagnosis present

## 2018-05-26 DIAGNOSIS — G4700 Insomnia, unspecified: Secondary | ICD-10-CM | POA: Diagnosis present

## 2018-05-26 DIAGNOSIS — T50911A Poisoning by multiple unspecified drugs, medicaments and biological substances, accidental (unintentional), initial encounter: Secondary | ICD-10-CM

## 2018-05-26 DIAGNOSIS — T50901A Poisoning by unspecified drugs, medicaments and biological substances, accidental (unintentional), initial encounter: Secondary | ICD-10-CM

## 2018-05-26 DIAGNOSIS — K047 Periapical abscess without sinus: Secondary | ICD-10-CM | POA: Diagnosis present

## 2018-05-26 DIAGNOSIS — Z79899 Other long term (current) drug therapy: Secondary | ICD-10-CM | POA: Diagnosis not present

## 2018-05-26 DIAGNOSIS — F332 Major depressive disorder, recurrent severe without psychotic features: Principal | ICD-10-CM | POA: Diagnosis present

## 2018-05-26 DIAGNOSIS — F411 Generalized anxiety disorder: Secondary | ICD-10-CM | POA: Diagnosis present

## 2018-05-26 DIAGNOSIS — R4587 Impulsiveness: Secondary | ICD-10-CM | POA: Diagnosis present

## 2018-05-26 DIAGNOSIS — T50904A Poisoning by unspecified drugs, medicaments and biological substances, undetermined, initial encounter: Secondary | ICD-10-CM

## 2018-05-26 DIAGNOSIS — G894 Chronic pain syndrome: Secondary | ICD-10-CM | POA: Diagnosis present

## 2018-05-26 DIAGNOSIS — F17210 Nicotine dependence, cigarettes, uncomplicated: Secondary | ICD-10-CM | POA: Diagnosis present

## 2018-05-26 LAB — RAPID URINE DRUG SCREEN, HOSP PERFORMED
Amphetamines: NOT DETECTED
Barbiturates: NOT DETECTED
Benzodiazepines: POSITIVE — AB
Cocaine: NOT DETECTED
Opiates: NOT DETECTED
Tetrahydrocannabinol: POSITIVE — AB

## 2018-05-26 LAB — PREGNANCY, URINE: Preg Test, Ur: NEGATIVE

## 2018-05-26 MED ORDER — POTASSIUM CHLORIDE CRYS ER 10 MEQ PO TBCR
10.0000 meq | EXTENDED_RELEASE_TABLET | Freq: Two times a day (BID) | ORAL | Status: AC
  Administered 2018-05-26 – 2018-05-27 (×3): 10 meq via ORAL
  Filled 2018-05-26 (×5): qty 1

## 2018-05-26 MED ORDER — NICOTINE POLACRILEX 2 MG MT GUM
2.0000 mg | CHEWING_GUM | OROMUCOSAL | Status: DC | PRN
  Administered 2018-05-27 – 2018-05-28 (×4): 2 mg via ORAL
  Filled 2018-05-26: qty 1

## 2018-05-26 MED ORDER — ALUM & MAG HYDROXIDE-SIMETH 200-200-20 MG/5ML PO SUSP: 30.0000 mL | Freq: Four times a day (QID) | ORAL | Status: DC | PRN

## 2018-05-26 MED ORDER — TRAZODONE HCL 50 MG PO TABS
50.0000 mg | ORAL_TABLET | Freq: Every evening | ORAL | Status: DC | PRN
  Administered 2018-05-26: 50 mg via ORAL
  Filled 2018-05-26 (×6): qty 1

## 2018-05-26 MED ORDER — HYDROXYZINE HCL 25 MG PO TABS
25.0000 mg | ORAL_TABLET | Freq: Four times a day (QID) | ORAL | Status: DC | PRN
  Administered 2018-05-26 – 2018-05-28 (×3): 25 mg via ORAL
  Filled 2018-05-26 (×2): qty 1
  Filled 2018-05-26: qty 10
  Filled 2018-05-26: qty 1

## 2018-05-26 MED ORDER — SERTRALINE HCL 50 MG PO TABS
150.0000 mg | ORAL_TABLET | Freq: Every day | ORAL | Status: DC
  Administered 2018-05-27 – 2018-05-29 (×3): 150 mg via ORAL
  Filled 2018-05-26 (×6): qty 1

## 2018-05-26 MED ORDER — ALPRAZOLAM 0.5 MG PO TABS
1.0000 mg | ORAL_TABLET | Freq: Three times a day (TID) | ORAL | Status: DC
  Administered 2018-05-26 – 2018-05-28 (×5): 1 mg via ORAL
  Filled 2018-05-26 (×5): qty 2

## 2018-05-26 MED ORDER — NICOTINE 21 MG/24HR TD PT24
21.0000 mg | MEDICATED_PATCH | Freq: Every day | TRANSDERMAL | Status: DC
  Administered 2018-05-26: 21 mg via TRANSDERMAL
  Filled 2018-05-26: qty 1

## 2018-05-26 MED ORDER — SERTRALINE HCL 50 MG PO TABS
100.0000 mg | ORAL_TABLET | Freq: Every day | ORAL | Status: DC
  Administered 2018-05-26: 100 mg via ORAL
  Filled 2018-05-26: qty 2

## 2018-05-26 MED ORDER — ALUM & MAG HYDROXIDE-SIMETH 200-200-20 MG/5ML PO SUSP: 30.0000 mL | ORAL | Status: DC | PRN

## 2018-05-26 MED ORDER — CLINDAMYCIN HCL 300 MG PO CAPS
300.0000 mg | ORAL_CAPSULE | Freq: Three times a day (TID) | ORAL | Status: DC
  Administered 2018-05-26 – 2018-05-29 (×9): 300 mg via ORAL
  Filled 2018-05-26 (×7): qty 1
  Filled 2018-05-26: qty 12
  Filled 2018-05-26 (×3): qty 1
  Filled 2018-05-26: qty 2
  Filled 2018-05-26 (×3): qty 1
  Filled 2018-05-26: qty 12
  Filled 2018-05-26 (×2): qty 1
  Filled 2018-05-26: qty 12

## 2018-05-26 MED ORDER — CLINDAMYCIN HCL 300 MG PO CAPS
300.0000 mg | ORAL_CAPSULE | Freq: Three times a day (TID) | ORAL | Status: DC
  Administered 2018-05-26: 300 mg via ORAL
  Filled 2018-05-26: qty 1

## 2018-05-26 MED ORDER — ONDANSETRON HCL 4 MG PO TABS: 4.0000 mg | ORAL_TABLET | Freq: Three times a day (TID) | ORAL | Status: DC | PRN

## 2018-05-26 MED ORDER — IBUPROFEN 800 MG PO TABS: 800.0000 mg | ORAL_TABLET | Freq: Two times a day (BID) | ORAL | Status: DC | PRN

## 2018-05-26 MED ORDER — MAGNESIUM HYDROXIDE 400 MG/5ML PO SUSP: 30.0000 mL | Freq: Every day | ORAL | Status: DC | PRN

## 2018-05-26 MED ORDER — IBUPROFEN 600 MG PO TABS
600.0000 mg | ORAL_TABLET | Freq: Four times a day (QID) | ORAL | Status: DC | PRN
  Administered 2018-05-26 – 2018-05-29 (×3): 600 mg via ORAL
  Filled 2018-05-26 (×3): qty 1

## 2018-05-26 MED ORDER — NICOTINE 21 MG/24HR TD PT24
21.0000 mg | MEDICATED_PATCH | Freq: Every day | TRANSDERMAL | Status: DC
  Filled 2018-05-26 (×2): qty 1

## 2018-05-26 NOTE — Progress Notes (Signed)
Patient ID: Kiara Murillo, female   DOB: [birth year], 38 y.o.   MRN: [id number]  Patient presents to [facility] due to overdose. Patient said she was "just feeling stressed out" when she overdosed on some muscle relaxers she bought off the street. Patient said finances, lack of insurance, and kids were her main stressors. Patient said she also lost her brother, but would not specify any further. Patient said it was a mistake and she knows she will never do it again. Currently denies SI HI AVH, says she was never suicidal. Patient's skin search was complete and found unremarkable. Safety is maintained with 15 minute checks as well as environmental checks. Will continue to monitor and provide support.

## 2018-05-26 NOTE — BH Assessment (Signed)
[facility] Assessment Progress Note  Per Donell Sievert, PA, this pt requires psychiatric hospitalization.  Fransico Michael, RN, [facility] has assigned pt to [facility] Rm 301-2; [facility] will be ready to receive pt after 10:00.  Pt presents under IVC initiated by EDP Mancel Bale, MD, and IVC documents have been faxed to [facility].  Pt's nurse has been notified, and agrees to call report to [phone].  Pt is to be transported via [employer].   Doylene Canning, [place] [facility] Coordinator [phone]

## 2018-05-26 NOTE — ED Notes (Signed)
TTS into see 
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
## 2018-05-26 NOTE — Tx Team (Signed)
Initial Treatment Plan 05/26/2018 1:59 PM Kiara Murillo TTS:[id number]    PATIENT STRESSORS: Medication change or noncompliance   PATIENT STRENGTHS: Ability for insight Average or above average intelligence General fund of knowledge   PATIENT IDENTIFIED PROBLEMS: "No coping skills" Impulsivity  Depression                     DISCHARGE CRITERIA:  Ability to meet basic life and health needs Safe-care adequate arrangements made  PRELIMINARY DISCHARGE PLAN: Outpatient therapy  PATIENT/FAMILY INVOLVEMENT: This treatment plan has been presented to and reviewed with the patient, Kiara Murillo. The patient and family have been given the opportunity to ask questions and make suggestions.  Dewayne Shorter, RN 05/26/2018, 1:59 PM

## 2018-05-26 NOTE — Progress Notes (Signed)
Adult Psychoeducational Group Note  Date:  05/26/2018 Time:  9:19 PM  Group Topic/Focus:  Wrap-Up Group:   The focus of this group is to help patients review their daily goal of treatment and discuss progress on daily workbooks.  Participation Level:  Active  Participation Quality:  Appropriate  Affect:  Appropriate  Cognitive:  Appropriate  Insight: Appropriate  Engagement in Group:  Engaged  Modes of Intervention:  Discussion  Additional Comments:  Patient attended group and participated.   Teriana Danker W Echo Propp 05/26/2018, 9:19 PM
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
## 2018-05-26 NOTE — ED Notes (Signed)
ED TO INPATIENT HANDOFF REPORT  Name/Age/Gender Kiara Murillo 38 y.o. female  Code Status    Code Status Orders  (From admission, onward)         Start     Ordered   05/26/18 0600  Full code  Continuous     05/26/18 0600        Code Status History    This patient has a current code status but no historical code status.      Home/SNF/Other Home  Chief Complaint Overdose  Level of Care/Admitting Diagnosis ED Disposition    ED Disposition Condition Comment   Transfer to Another Facility  Transfer to [facility], bed 301-2 Accepting provider is Dr Jama Flavors, Attending provider is Dr Lucianne Muss      Medical History Past Medical History:  Diagnosis Date  . Anxiety   . Chondromalacia of patella   . Chronic pain syndrome   . Chronic pelvic pain in female   . Congenital spondylolisthesis   . Congenital spondylolisthesis   . Depression   . Headache(784.0) hemiplegic migraine  . Low back pain   . Lumbar spondylolysis   . Spinal stenosis in cervical region   . Spinal stenosis in cervical region     Allergies Allergies  Allergen Reactions  . Hydrocodone-Acetaminophen Other (See Comments)    Causes migraines over prolonged treatment use headache   . Eggs Or Egg-Derived Products   . Gabapentin Other (See Comments)    Felt dizzy and fell once  . Hydrocodone Nausea And Vomiting and Other (See Comments)    migraines  . Toradol [Ketorolac Tromethamine]     "made me feel depressed"  . Tramadol Nausea And Vomiting  . Metronidazole Rash    IV Location/Drains/Wounds Patient Lines/Drains/Airways Status   Active Line/Drains/Airways    Name:   Placement date:   Placement time:   Site:   Days:   Peripheral IV 05/25/18 Right Antecubital   05/25/18    1926    Antecubital   1          Labs/Imaging Results for orders placed or performed during the hospital encounter of 05/25/18 (from the past 48 hour(s))  Urine rapid drug screen (hosp performed)     Status: Abnormal   Collection Time: 05/25/18  7:11 PM  Result Value Ref Range   Opiates NONE DETECTED NONE DETECTED   Cocaine NONE DETECTED NONE DETECTED   Benzodiazepines POSITIVE (A) NONE DETECTED   Amphetamines NONE DETECTED NONE DETECTED   Tetrahydrocannabinol POSITIVE (A) NONE DETECTED   Barbiturates NONE DETECTED NONE DETECTED    Comment: (NOTE) DRUG SCREEN FOR MEDICAL PURPOSES ONLY.  IF CONFIRMATION IS NEEDED FOR ANY PURPOSE, NOTIFY LAB WITHIN 5 DAYS. LOWEST DETECTABLE LIMITS FOR URINE DRUG SCREEN Drug Class                     Cutoff (ng/mL) Amphetamine and metabolites    1000 Barbiturate and metabolites    200 Benzodiazepine                 200 Tricyclics and metabolites     300 Opiates and metabolites        300 Cocaine and metabolites        300 THC                            50 Performed at [facility], [address] [place], [place] [address]  Comprehensive metabolic panel     Status: Abnormal   Collection Time: 05/25/18  7:22 PM  Result Value Ref Range   Sodium 137 135 - 145 mmol/L   Potassium 3.2 (L) 3.5 - 5.1 mmol/L   Chloride 108 98 - 111 mmol/L   CO2 22 22 - 32 mmol/L   Glucose, Bld 92 70 - 99 mg/dL   BUN 6 6 - 20 mg/dL   Creatinine, Ser 4.330.89 0.44 - 1.00 mg/dL   Calcium 8.7 (L) 8.9 - 10.3 mg/dL   Total Protein 7.3 6.5 - 8.1 g/dL   Albumin 4.4 3.5 - 5.0 g/dL   AST 26 15 - 41 U/L   ALT 18 0 - 44 U/L   Alkaline Phosphatase 75 38 - 126 U/L   Total Bilirubin 0.2 (L) 0.3 - 1.2 mg/dL   GFR calc non Af Amer >60 >60 mL/min   GFR calc Af Amer >60 >60 mL/min   Anion gap 7 5 - 15    Comment: Performed at [facility], [address] [place], [place] [address]  CBC with Differential     Status: Abnormal   Collection Time: 05/25/18  7:22 PM  Result Value Ref Range   WBC 11.9 (H) 4.0 - 10.5 K/uL   RBC 4.08 3.87 - 5.11 MIL/uL   Hemoglobin 12.9 12.0 - 15.0 g/dL   HCT 84.140.2 66.036.0 - 63.046.0 %   MCV 98.5 80.0 - 100.0 fL   MCH 31.6 26.0 - 34.0 pg    MCHC 32.1 30.0 - 36.0 g/dL   RDW 16.012.5 10.911.5 - 32.315.5 %   Platelets 225 150 - 400 K/uL   nRBC 0.0 0.0 - 0.2 %   Neutrophils Relative % 75 %   Neutro Abs 8.8 (H) 1.7 - 7.7 K/uL   Lymphocytes Relative 21 %   Lymphs Abs 2.5 0.7 - 4.0 K/uL   Monocytes Relative 4 %   Monocytes Absolute 0.5 0.1 - 1.0 K/uL   Eosinophils Relative 0 %   Eosinophils Absolute 0.0 0.0 - 0.5 K/uL   Basophils Relative 0 %   Basophils Absolute 0.0 0.0 - 0.1 K/uL   Immature Granulocytes 0 %   Abs Immature Granulocytes 0.05 0.00 - 0.07 K/uL    Comment: Performed at [facility], [address] [place], [place] [address]  Acetaminophen level     Status: Abnormal   Collection Time: 05/25/18  7:22 PM  Result Value Ref Range   Acetaminophen (Tylenol), Serum <10 (L) 10 - 30 ug/mL    Comment: (NOTE) Therapeutic concentrations vary significantly. A range of 10-30 ug/mL  may be an effective concentration for many patients. However, some  are best treated at concentrations outside of this range. Acetaminophen concentrations >150 ug/mL at 4 hours after ingestion  and >50 ug/mL at 12 hours after ingestion are often associated with  toxic reactions. Performed at [facility], [address] [place], [place] [address]   Salicylate level     Status: None   Collection Time: 05/25/18  7:22 PM  Result Value Ref Range   Salicylate Lvl <7.0 2.8 - 30.0 mg/dL    Comment: Performed at [facility], [address] [place], [place] [address]  Ethanol     Status: None   Collection Time: 05/25/18  7:22 PM  Result Value Ref Range   Alcohol, Ethyl (B) <10 <10 mg/dL    Comment: (NOTE) Lowest detectable limit for serum alcohol is 10 mg/dL. For  medical purposes only. Performed at [facility], [address], [place], [place] [address]    No results found. EKG Interpretation  Date/Time:  Wednesday May 25 2018 19:16:18 EST Ventricular Rate:  91 PR  Interval:    QRS Duration: 93 QT Interval:  356 QTC Calculation: 438 R Axis:   90 Text Interpretation:  Sinus rhythm Ventricular premature complex Consider right ventricular hypertrophy Baseline wander in lead(s) V2 Partial missing lead(s): V2 No old tracing to compare Confirmed by Mancel Bale [phone]) on 05/25/2018 8:58:27 PM Also confirmed by Mancel Bale [phone]), editor Barbette Hair [phone])  on 05/26/2018 7:02:38 AM   Pending Labs Unresulted Labs (From admission, onward)   None      Vitals/Pain Today's Vitals   05/26/18 0700 05/26/18 0830 05/26/18 0923 05/26/18 0930  BP: 115/75 107/62 124/75 121/78  Pulse: 60 76 67 72  Resp: 17 16 19 14   Temp:      TempSrc:      SpO2: 97% 97% 99% 98%  Weight:   59 kg   Height:   5\' 4"  (1.626 m)   PainSc:   0-No pain     Isolation Precautions No active isolations  Medications Medications  0.9 %  sodium chloride infusion ( Intravenous Stopped 05/26/18 0658)  nicotine (NICODERM CQ - dosed in mg/24 hours) patch 21 mg (21 mg Transdermal Patch Applied 05/26/18 0920)  ondansetron (ZOFRAN) tablet 4 mg (has no administration in time range)  alum & mag hydroxide-simeth (MAALOX/MYLANTA) 200-200-20 MG/5ML suspension 30 mL (has no administration in time range)  clindamycin (CLEOCIN) capsule 300 mg (300 mg Oral Given 05/26/18 0920)  ibuprofen (ADVIL,MOTRIN) tablet 800 mg (has no administration in time range)  sertraline (ZOLOFT) tablet 100 mg (100 mg Oral Given 05/26/18 0920)  sodium chloride 0.9 % bolus 500 mL (0 mLs Intravenous Stopped 05/25/18 2038)    Mobility walks
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
## 2018-05-26 NOTE — ED Notes (Signed)
Pt ate eggs and juice off of tray

## 2018-05-26 NOTE — Progress Notes (Signed)
D: Pt was in dayroom upon initial approach.  Pt presents with anxious, depressed affect and mood.  She describes her day as "eventful" since she came from the ED to [facility].  Her goal is to "think about my kids."  Pt denies SI/HI, denies hallucinations, reports back pain of 6/10.  Pt has been visible in milieu interacting with peers and staff appropriately.  Pt attended evening group.    A: Introduced self to pt.  Met with pt 1:1.  Actively listened to pt and offered support and encouragement.  Medications administered per order.  PRN medication administered for anxiety and pain.  Q15 minute safety checks maintained.  R: Pt is safe on the unit.  Pt is compliant with medications.  Pt verbally contracts for safety.  Will continue to monitor and assess.

## 2018-05-26 NOTE — [facility] Group Notes (Signed)
[facility] LCSW Group Therapy Note  Date/Time: 05/26/18 1:30-2:30PM  Type of Therapy/Topic:  Group Therapy:  Feelings about Diagnosis  Participation Level:  Did Not Attend   Mood:   Description of Group:    This group will allow patients to explore their thoughts and feelings about diagnoses they have received. Patients will be guided to explore their level of understanding and acceptance of these diagnoses. Facilitator will encourage patients to process their thoughts and feelings about the reactions of others to their diagnosis, and will guide patients in identifying ways to discuss their diagnosis with significant others in their lives. This group will be process-oriented, with patients participating in exploration of their own experiences as well as giving and receiving support and challenge from other group members.    Marian Sorrow, MSW Intern CSW Department

## 2018-05-27 DIAGNOSIS — F411 Generalized anxiety disorder: Secondary | ICD-10-CM

## 2018-05-27 DIAGNOSIS — F332 Major depressive disorder, recurrent severe without psychotic features: Principal | ICD-10-CM

## 2018-05-27 LAB — TSH: TSH: 2.94 u[IU]/mL (ref 0.350–4.500)

## 2018-05-27 MED ORDER — TRAZODONE HCL 100 MG PO TABS
100.0000 mg | ORAL_TABLET | Freq: Every evening | ORAL | Status: DC | PRN
  Administered 2018-05-27: 100 mg via ORAL
  Filled 2018-05-27 (×7): qty 1

## 2018-05-27 NOTE — Progress Notes (Signed)
D: Pt was in dayroom upon initial approach.  Pt presents with anxious affect and mood.  She reports her day "hasn't been too bad."  Her goal is to "get a good nights sleep.  I tossed and turned last night."  Pt denies SI/HI, denies hallucinations, reports chronic back pain of 6/10.  Pt has been visible in milieu interacting with peers and staff appropriately.  Pt attended evening group.    A: Introduced self to pt.  Met with pt 1:1.  Actively listened to pt and offered support and encouragement.  Medications administered per order.  PRN medication administered for anxiety and pain.  Heat packs provided for pain.  Q15 minute safety checks maintained.  R: Pt is safe on the unit.  Pt is compliant with medications.  Pt verbally contracts for safety.  Will continue to monitor and assess.

## 2018-05-27 NOTE — Tx Team (Signed)
Interdisciplinary Treatment and Diagnostic Plan Update  05/27/2018 Time of Session: 9:00am Kiara Murillo MRN: [id number]  Principal Diagnosis: <principal problem not specified>  Secondary Diagnoses: Active Problems:   MDD (major depressive disorder), recurrent episode, severe (HCC)   Current Medications:  Current Facility-Administered Medications  Medication Dose Route Frequency Provider Last Rate Last Dose  . ALPRAZolam Duanne Moron) tablet 1 mg  1 mg Oral TID Cobos, Myer Peer, MD   1 mg at 05/27/18 0752  . alum & mag hydroxide-simeth (MAALOX/MYLANTA) 200-200-20 MG/5ML suspension 30 mL  30 mL Oral Q4H PRN Patriciaann Clan E, PA-C      . clindamycin (CLEOCIN) capsule 300 mg  300 mg Oral Q8H Cobos, Fernando A, MD   300 mg at 05/27/18 1308  . hydrOXYzine (ATARAX/VISTARIL) tablet 25 mg  25 mg Oral Q6H PRN Laverle Hobby, PA-C   25 mg at 05/26/18 2108  . ibuprofen (ADVIL,MOTRIN) tablet 600 mg  600 mg Oral Q6H PRN Laverle Hobby, PA-C   600 mg at 05/26/18 2107  . magnesium hydroxide (MILK OF MAGNESIA) suspension 30 mL  30 mL Oral Daily PRN Laverle Hobby, PA-C      . nicotine polacrilex (NICORETTE) gum 2 mg  2 mg Oral PRN Cobos, Myer Peer, MD      . potassium chloride (K-DUR,KLOR-CON) CR tablet 10 mEq  10 mEq Oral BID Cobos, Myer Peer, MD   10 mEq at 05/27/18 0752  . sertraline (ZOLOFT) tablet 150 mg  150 mg Oral Daily Cobos, Myer Peer, MD   150 mg at 05/27/18 0752  . traZODone (DESYREL) tablet 50 mg  50 mg Oral QHS,MR X 1 Laverle Hobby, PA-C   50 mg at 05/26/18 2108   PTA Medications: Medications Prior to Admission  Medication Sig Dispense Refill Last Dose  . ALPRAZolam (XANAX) 1 MG tablet Take 1 mg by mouth 4 (four) times daily.   1 05/25/2018 at Unknown time  . clindamycin (CLEOCIN) 300 MG capsule Take 300 mg by mouth 3 (three) times daily.   05/25/2018 at Unknown time  . ibuprofen (ADVIL,MOTRIN) 200 MG tablet Take 800 mg by mouth 2 (two) times daily as needed for moderate pain.   05/24/2018  at Unknown time  . sertraline (ZOLOFT) 100 MG tablet Take 100 mg by mouth daily.   05/25/2018 at Unknown time    Patient Stressors: Medication change or noncompliance  Patient Strengths: Ability for insight Average or above average intelligence General fund of knowledge  Treatment Modalities: Medication Management, Group therapy, Case management,  1 to 1 session with clinician, Psychoeducation, Recreational therapy.   Physician Treatment Plan for Primary Diagnosis: <principal problem not specified> Long Term Goal(s): Improvement in symptoms so as ready for discharge Improvement in symptoms so as ready for discharge   Short Term Goals: Ability to identify changes in lifestyle to reduce recurrence of condition will improve Ability to verbalize feelings will improve Ability to disclose and discuss suicidal ideas Ability to demonstrate self-control will improve Ability to identify and develop effective coping behaviors will improve Ability to maintain clinical measurements within normal limits will improve Ability to identify changes in lifestyle to reduce recurrence of condition will improve Ability to maintain clinical measurements within normal limits will improve  Medication Management: Evaluate patient's response, side effects, and tolerance of medication regimen.  Therapeutic Interventions: 1 to 1 sessions, Unit Group sessions and Medication administration.  Evaluation of Outcomes: Not Met  Physician Treatment Plan for Secondary Diagnosis: Active Problems:   MDD (  major depressive disorder), recurrent episode, severe ([place])  Long Term Goal(s): Improvement in symptoms so as ready for discharge Improvement in symptoms so as ready for discharge   Short Term Goals: Ability to identify changes in lifestyle to reduce recurrence of condition will improve Ability to verbalize feelings will improve Ability to disclose and discuss suicidal ideas Ability to demonstrate self-control will  improve Ability to identify and develop effective coping behaviors will improve Ability to maintain clinical measurements within normal limits will improve Ability to identify changes in lifestyle to reduce recurrence of condition will improve Ability to maintain clinical measurements within normal limits will improve     Medication Management: Evaluate patient's response, side effects, and tolerance of medication regimen.  Therapeutic Interventions: 1 to 1 sessions, Unit Group sessions and Medication administration.  Evaluation of Outcomes: Not Met   RN Treatment Plan for Primary Diagnosis: <principal problem not specified> Long Term Goal(s): Knowledge of disease and therapeutic regimen to maintain health will improve  Short Term Goals: Ability to demonstrate self-control, Ability to identify and develop effective coping behaviors will improve and Compliance with prescribed medications will improve  Medication Management: RN will administer medications as ordered by provider, will assess and evaluate patient's response and provide education to patient for prescribed medication. RN will report any adverse and/or side effects to prescribing provider.  Therapeutic Interventions: 1 on 1 counseling sessions, Psychoeducation, Medication administration, Evaluate responses to treatment, Monitor vital signs and CBGs as ordered, Perform/monitor CIWA, COWS, AIMS and Fall Risk screenings as ordered, Perform wound care treatments as ordered.  Evaluation of Outcomes: Not Met   LCSW Treatment Plan for Primary Diagnosis: <principal problem not specified> Long Term Goal(s): Safe transition to appropriate next level of care at discharge, Engage patient in therapeutic group addressing interpersonal concerns.  Short Term Goals: Engage patient in aftercare planning with referrals and resources, Increase social support, Facilitate patient progression through stages of change regarding substance use diagnoses  and concerns, Identify triggers associated with mental health/substance abuse issues and Increase skills for wellness and recovery  Therapeutic Interventions: Assess for all discharge needs, 1 to 1 time with Social worker, Explore available resources and support systems, Assess for adequacy in community support network, Educate family and significant other(s) on suicide prevention, Complete Psychosocial Assessment, Interpersonal group therapy.  Evaluation of Outcomes: Not Met   Progress in Treatment: Attending groups: Yes. Participating in groups: Yes. Taking medication as prescribed: Yes. Toleration medication: Yes. Family/Significant other contact made: No, will contact:  supports if consent is granted Patient understands diagnosis: Yes. Discussing patient identified problems/goals with staff: No. Medical problems stabilized or resolved: Yes. Denies suicidal/homicidal ideation: Yes. Issues/concerns per patient self-inventory: No.  New problem(s) identified: No, Describe:  CSW continuing to assess  New Short Term/Long Term Goal(s): detox, medication management for mood stabilization; elimination of SI thoughts; development of comprehensive mental wellness/sobriety plan.  Patient Goals: Find coping skills  Discharge Plan or Barriers: Continuing to assess. [place] pamphlet, Mobile Crisis information, and AA/NA information provided to patient for additional community support and resources.   Reason for Continuation of Hospitalization: Anxiety Depression Withdrawal symptoms  Estimated Length of Stay: 3-5 days  Attendees: Patient: 05/27/2018 9:21 AM  Physician:  05/27/2018 9:21 AM  Nursing:  05/27/2018 9:21 AM  RN Care Manager: 05/27/2018 9:21 AM  Social Worker: Stephanie Acre, [place] 05/27/2018 9:21 AM  Recreational Therapist:  05/27/2018 9:21 AM  Other:  05/27/2018 9:21 AM  Other:  05/27/2018 9:21 AM  Other: 05/27/2018 9:21 AM  Scribe for Treatment Team: Joellen Jersey, Latanya Presser 05/27/2018 9:21  AM

## 2018-05-27 NOTE — Progress Notes (Signed)
Recreation Therapy Notes  Date:  2.7..20 Time: 0930 Location: [address] Dayroom  Group Topic: Stress Management  Goal Area(s) Addresses:  Patient will identify positive stress management techniques. Patient will identify benefits of using stress management post d/c.  Intervention:  Stress Management  Activity :   Meditation.  LRT introduced the stress management technique of meditation.  LRT played a meditation that focused on the idea of "pure possibility" to bring into the day.  Patients were to listen to the meditation and follow along as it played to engage.  Education:  Stress Management, Discharge Planning.   Education Outcome: Acknowledges Education  Clinical Observations/Feedback:  Pt did not attend group.      Quindarius Cabello, LRT/CTRS         Madolyn Ackroyd A 05/27/2018 11:34 AM 

## 2018-05-27 NOTE — Plan of Care (Addendum)
Patient self inventory- Patient slept fair last night, sleep medication was requested and was helpful. Appetite is good, energy level is normal, and concentration is good. Hopelessness, depression, and anxiety rated 2, 0, 4 out of 10. Denies SI HI AVH. Denies physical pain. Patient's goal is "bettering myself."  Patient is compliant with medications prescribed. Safety is maintained with 15 minute checks as well as environmental checks. Will continue to monitor and provide support.  Problem: Education: Goal: Knowledge of [place] [facility] Education information/materials will improve Outcome: Progressing Goal: Emotional status will improve Outcome: Progressing Goal: Mental status will improve Outcome: Progressing Goal: Verbalization of understanding the information provided will improve Outcome: Progressing   Problem: Activity: Goal: Interest or engagement in activities will improve Outcome: Progressing Goal: Sleeping patterns will improve Outcome: Progressing

## 2018-05-27 NOTE — Progress Notes (Addendum)
[facility]  05/27/2018 1:13 PM Kiara Murillo  MRN:  [id number] Subjective:  "I'm ready to go home."  Ms. Kiara Murillo socializing in the dayroom. Presents with flat affect. She minimizes overdose and continues to deny suicidal intent behind it- "I was just trying to get high." She denies depression and denies SI. She does report recent anxiety about finances, and feels anxious today related to being hospitalized. She identifies going to church and talking about her problems as coping skills to prevent future overdoses when anxious. Zoloft was increased yesterday, Xanax decreased. She denies medication side effects. She reports continued problems with insomnia, states she has previously taken 100-150 mg trazodone for sleep.   From admission H&P: 38 year old female, presented to ED on 2/5 via EMS, after she overdosed on about 20 tablets of Carisoprodol ( states she obtained from a friend). States overdose was impulsive, unplanned, and states " really I think I was just trying to get high, I have been under a lot of stress lately and needed to relax". Patient states that this was the first time she abused the Carisoprodol, and that she had not taken any prior to day of admission. States after overdose she was slurring words and was sedated so BF called EMS. She reports stressors have included financial difficulties, not having a car. Chart notes indicate that patient had reported thoughts of " not wanting to live ", but patient denies and states " I was not trying to kill myself, I have a lot to live for". At this time denies significant neuro-vegetative symptoms of depression, and denies anhedonia, pervasive sense of sadness or any recent suicidal ideations. Denies changes in energy or appetite, does endorse some insomnia. Denies psychotic symptoms. States she had started on Xanax, Seroquel about a month ago due to anxiety and tendency to " worry about everything". Describes generalized worrying , anxiety.  Denies panic attacks.  Principal Problem: MDD (major depressive disorder), recurrent episode, severe (HCC) Diagnosis: Principal Problem:   MDD (major depressive disorder), recurrent episode, severe (HCC) Active Problems:   Generalized anxiety disorder  Total Time spent with patient: 15 minutes  Past Psychiatric History: See admission H&P  Past Medical History:  Past Medical History:  Diagnosis Date  . Anxiety   . Chondromalacia of patella   . Chronic pain syndrome   . Chronic pelvic pain in female   . Congenital spondylolisthesis   . Congenital spondylolisthesis   . Depression   . Headache(784.0) hemiplegic migraine  . Low back pain   . Lumbar spondylolysis   . Spinal stenosis in cervical region   . Spinal stenosis in cervical region     Past Surgical History:  Procedure Laterality Date  . CESAREAN SECTION    . MOUTH SURGERY    . TONSILLECTOMY    . TUBAL LIGATION     Family History: History reviewed. No pertinent family history. Family Psychiatric  History: See admission H&P Social History:  Social History   Substance and Sexual Activity  Alcohol Use No     Social History   Substance and Sexual Activity  Drug Use No    Social History   Socioeconomic History  . Marital status: Single    Spouse name: Not on file  . Number of children: Not on file  . Years of education: Not on file  . Highest education level: Not on file  Occupational History  . Not on file  Social Needs  . Financial resource strain: Not  on file  . Food insecurity:    Worry: Not on file    Inability: Not on file  . Transportation needs:    Medical: Not on file    Non-medical: Not on file  Tobacco Use  . Smoking status: Current Every Day Smoker    Packs/day: 1.00    Types: Cigarettes  . Smokeless tobacco: Never Used  Substance and Sexual Activity  . Alcohol use: No  . Drug use: No  . Sexual activity: Not on file  Lifestyle  . Physical activity:    Days per week: Not on file     Minutes per session: Not on file  . Stress: Not on file  Relationships  . Social connections:    Talks on phone: Not on file    Gets together: Not on file    Attends religious service: Not on file    Active member of club or organization: Not on file    Attends meetings of clubs or organizations: Not on file    Relationship status: Not on file  Other Topics Concern  . Not on file  Social History Narrative  . Not on file   Additional Social History:                         Sleep: Poor  Appetite:  Good  Current Medications: Current Facility-Administered Medications  Medication Dose Route Frequency Provider Last Rate Last Dose  . ALPRAZolam Prudy Feeler(XANAX) tablet 1 mg  1 mg Oral TID Lark Runk, Rockey SituFernando A, MD   1 mg at 05/27/18 1130  . alum & mag hydroxide-simeth (MAALOX/MYLANTA) 200-200-20 MG/5ML suspension 30 mL  30 mL Oral Q4H PRN Donell SievertSimon, Spencer E, PA-C      . clindamycin (CLEOCIN) capsule 300 mg  300 mg Oral Q8H Gaither Biehn A, MD   300 mg at 05/27/18 16100628  . hydrOXYzine (ATARAX/VISTARIL) tablet 25 mg  25 mg Oral Q6H PRN Kerry HoughSimon, Spencer E, PA-C   25 mg at 05/26/18 2108  . ibuprofen (ADVIL,MOTRIN) tablet 600 mg  600 mg Oral Q6H PRN Kerry HoughSimon, Spencer E, PA-C   600 mg at 05/26/18 2107  . magnesium hydroxide (MILK OF MAGNESIA) suspension 30 mL  30 mL Oral Daily PRN Kerry HoughSimon, Spencer E, PA-C      . nicotine polacrilex (NICORETTE) gum 2 mg  2 mg Oral PRN Thalya Fouche, Rockey SituFernando A, MD      . potassium chloride (K-DUR,KLOR-CON) CR tablet 10 mEq  10 mEq Oral BID Hassie Mandt, Rockey SituFernando A, MD   10 mEq at 05/27/18 0752  . sertraline (ZOLOFT) tablet 150 mg  150 mg Oral Daily Tige Meas, Rockey SituFernando A, MD   150 mg at 05/27/18 0752  . traZODone (DESYREL) tablet 50 mg  50 mg Oral QHS,MR X 1 Kerry HoughSimon, Spencer E, PA-C   50 mg at 05/26/18 2108    Lab Results:  Results for orders placed or performed during the hospital encounter of 05/26/18 (from the past 48 hour(s))  Pregnancy, urine     Status: None   Collection Time:  05/26/18  3:09 PM  Result Value Ref Range   Preg Test, Ur NEGATIVE NEGATIVE    Comment:        THE SENSITIVITY OF THIS METHODOLOGY IS >20 mIU/mL. Performed at [facility], [address] [place], [place] [id number]   TSH     Status: None   Collection Time: 05/27/18  6:42 AM  Result Value Ref Range   TSH 2.940  0.350 - 4.500 uIU/mL    Comment: Performed by a 3rd Generation assay with a functional sensitivity of <=0.01 uIU/mL. Performed at [facility], [address] [place], [place] [address]     Blood Alcohol level:  Lab Results  Component Value Date   ETH <10 05/25/2018    Metabolic Disorder Labs: No results found for: HGBA1C, MPG No results found for: PROLACTIN No results found for: CHOL, TRIG, HDL, CHOLHDL, VLDL, LDLCALC  Physical Findings: AIMS:  , ,  ,  ,    CIWA:    COWS:     Musculoskeletal: Strength & Muscle Tone: within normal limits Gait & Station: normal Patient leans: N/A  Psychiatric Specialty Exam: Physical Exam  Nursing note and vitals reviewed. Constitutional: She is oriented to person, place, and time. She appears well-developed and well-nourished.  Cardiovascular: Normal rate.  Respiratory: Effort normal.  Neurological: She is alert and oriented to person, place, and time.    Review of Systems  Constitutional: Negative.   Psychiatric/Behavioral: Positive for substance abuse (UDS +THC). Negative for depression, hallucinations, memory loss and suicidal ideas. The patient is nervous/anxious and has insomnia.     Blood pressure 98/68, pulse 64, temperature 98.5 F (36.9 C), temperature source Oral, resp. rate 18, height 5\' 4"  (1.626 m), weight 59 kg, SpO2 100 %.Body mass index is 22.31 kg/m.  General Appearance: Casual  Eye Contact:  Good  Speech:  Clear and Coherent and Normal Rate  Volume:  Normal  Mood:  Anxious  Affect:  Flat  Thought Process:  Coherent  Orientation:  Full (Time, Place, and Person)   Thought Content:  WDL  Suicidal Thoughts:  No  Homicidal Thoughts:  No  Memory:  Immediate;   Good Recent;   Fair  Judgement:  Fair  Insight:  Shallow  Psychomotor Activity:  Normal  Concentration:  Concentration: Good  Recall:  Good  Fund of Knowledge:  Fair  Language:  Good  Akathisia:  No  Handed:  Right  AIMS (if indicated):     Assets:  Communication Skills Desire for Improvement Housing Physical Health Resilience Social Support  ADL's:  Intact  Cognition:  WNL  Sleep:  Number of Hours: 6.25     Treatment Plan Summary: Daily contact with patient to assess and evaluate symptoms and progress in treatment and Medication management   Continue inpatient hospitalization.  Increase trazodone to 100 mg PO QHS PRN insomnia Continue Xanax taper at 1 mg PO TID for anxiety Continue Zoloft 150 mg PO daily for mood Continue Vistaril 25 mg PO Q6HR PRN anxiety Continue clindamycin 300 mg PO TID for dental infection  Patient will participate in the therapeutic group milieu.  Discharge disposition in progress.   Aldean Baker, NP 05/27/2018, 1:13 PM   ..Agree with NP Progress Note

## 2018-05-28 ENCOUNTER — Encounter (HOSPITAL_COMMUNITY): Payer: Self-pay | Admitting: Behavioral Health

## 2018-05-28 MED ORDER — ALPRAZOLAM 0.5 MG PO TABS
0.5000 mg | ORAL_TABLET | Freq: Every day | ORAL | Status: DC
  Administered 2018-05-28 – 2018-05-29 (×2): 0.5 mg via ORAL
  Filled 2018-05-28 (×2): qty 1

## 2018-05-28 MED ORDER — ALPRAZOLAM 0.5 MG PO TABS: 0.5000 mg | ORAL_TABLET | Freq: Every day | ORAL | Status: DC

## 2018-05-28 MED ORDER — ALPRAZOLAM 0.5 MG PO TABS: 1.0000 mg | ORAL_TABLET | Freq: Two times a day (BID) | ORAL | Status: DC

## 2018-05-28 MED ORDER — ALPRAZOLAM 0.5 MG PO TABS
1.0000 mg | ORAL_TABLET | Freq: Two times a day (BID) | ORAL | Status: DC
  Administered 2018-05-28 – 2018-05-29 (×2): 1 mg via ORAL
  Filled 2018-05-28 (×2): qty 2

## 2018-05-28 MED ORDER — TRAZODONE HCL 100 MG PO TABS
100.0000 mg | ORAL_TABLET | Freq: Every evening | ORAL | Status: DC | PRN
  Administered 2018-05-28: 100 mg via ORAL
  Filled 2018-05-28: qty 7
  Filled 2018-05-28: qty 1

## 2018-05-28 MED ORDER — TRAZODONE HCL 100 MG PO TABS
100.0000 mg | ORAL_TABLET | Freq: Once | ORAL | Status: AC
  Administered 2018-05-28: 100 mg via ORAL
  Filled 2018-05-28 (×2): qty 1

## 2018-05-28 NOTE — Progress Notes (Signed)
[facility]  05/28/2018 10:30 AM Kiara Murillo  MRN:  [id number]  Subjective:  "I feel ok. I slept better". States a  friend came to visit her last night , which helped her feel better as well, states she has a good support network .  Objective: Patient seen, chart reviewed. 38 year old female who presented to the unit after being transferred from the ED on 2/5 via EMS, after she overdosed on about 20 tablets of Carisoprodol ( states she obtained from a friend). States overdose was impulsive, unplanned, and states " really I think I was just trying to get high, I have been under a lot of stress lately and needed to relax".   Currently alert , attentive. Endorses feeling better/improvement, and her affect is noted to be  partially improved compared to admission. She continues to have some mild anxiety as noted by nursing. Behavior on unit in good control, pleasant on approach. Tends to minimize recent overdose and states it was simply an attempt to get some relaxation. Denies any current suicidal ideations and describes mood as better. She denies  medication side effects.  She is agreeing to a gradual Xanax taper, with eventual goal  of her coming off (will recommend continued tapering following discharge) - has tolerated well thus far, no symptoms of WDL. As she improves she is becoming more future oriented, and hoping to discharge soon.       Principal Problem: MDD (major depressive disorder), recurrent episode, severe (HCC) Diagnosis: Principal Problem:   MDD (major depressive disorder), recurrent episode, severe (HCC) Active Problems:   Generalized anxiety disorder  Total Time spent with patient: 20 minutes  Past Psychiatric History: See admission H&P  Past Medical History:  Past Medical History:  Diagnosis Date  . Anxiety   . Chondromalacia of patella   . Chronic pain syndrome   . Chronic pelvic pain in female   . Congenital spondylolisthesis   . Congenital  spondylolisthesis   . Depression   . Headache(784.0) hemiplegic migraine  . Low back pain   . Lumbar spondylolysis   . Spinal stenosis in cervical region   . Spinal stenosis in cervical region     Past Surgical History:  Procedure Laterality Date  . CESAREAN SECTION    . MOUTH SURGERY    . TONSILLECTOMY    . TUBAL LIGATION     Family History: History reviewed. No pertinent family history. Family Psychiatric  History: See admission H&P Social History:  Social History   Substance and Sexual Activity  Alcohol Use No     Social History   Substance and Sexual Activity  Drug Use No    Social History   Socioeconomic History  . Marital status: Single    Spouse name: Not on file  . Number of children: Not on file  . Years of education: Not on file  . Highest education level: Not on file  Occupational History  . Not on file  Social Needs  . Financial resource strain: Not on file  . Food insecurity:    Worry: Not on file    Inability: Not on file  . Transportation needs:    Medical: Not on file    Non-medical: Not on file  Tobacco Use  . Smoking status: Current Every Day Smoker    Packs/day: 1.00    Types: Cigarettes  . Smokeless tobacco: Never Used  Substance and Sexual Activity  . Alcohol use: No  . Drug use: No  .  Sexual activity: Not on file  Lifestyle  . Physical activity:    Days per week: Not on file    Minutes per session: Not on file  . Stress: Not on file  Relationships  . Social connections:    Talks on phone: Not on file    Gets together: Not on file    Attends religious service: Not on file    Active member of club or organization: Not on file    Attends meetings of clubs or organizations: Not on file    Relationship status: Not on file  Other Topics Concern  . Not on file  Social History Narrative  . Not on file   Additional Social History:   Sleep: Good  Appetite:  Good  Current Medications: Current Facility-Administered Medications   Medication Dose Route Frequency Provider Last Rate Last Dose  . ALPRAZolam Prudy Feeler) tablet 1 mg  1 mg Oral TID Cobos, Rockey Situ, MD   1 mg at 05/28/18 0808  . alum & mag hydroxide-simeth (MAALOX/MYLANTA) 200-200-20 MG/5ML suspension 30 mL  30 mL Oral Q4H PRN Donell Sievert E, PA-C      . clindamycin (CLEOCIN) capsule 300 mg  300 mg Oral Q8H Cobos, Rockey Situ, MD   300 mg at 05/28/18 0808  . hydrOXYzine (ATARAX/VISTARIL) tablet 25 mg  25 mg Oral Q6H PRN Kerry Hough, PA-C   25 mg at 05/27/18 2120  . ibuprofen (ADVIL,MOTRIN) tablet 600 mg  600 mg Oral Q6H PRN Kerry Hough, PA-C   600 mg at 05/27/18 2120  . magnesium hydroxide (MILK OF MAGNESIA) suspension 30 mL  30 mL Oral Daily PRN Donell Sievert E, PA-C      . nicotine polacrilex (NICORETTE) gum 2 mg  2 mg Oral PRN Cobos, Rockey Situ, MD   2 mg at 05/28/18 0809  . sertraline (ZOLOFT) tablet 150 mg  150 mg Oral Daily Cobos, Rockey Situ, MD   150 mg at 05/28/18 0808  . traZODone (DESYREL) tablet 100 mg  100 mg Oral QHS,MR X 1 Aldean Baker, NP   100 mg at 05/27/18 2120    Lab Results:  Results for orders placed or performed during the hospital encounter of 05/26/18 (from the past 48 hour(s))  Pregnancy, urine     Status: None   Collection Time: 05/26/18  3:09 PM  Result Value Ref Range   Preg Test, Ur NEGATIVE NEGATIVE    Comment:        THE SENSITIVITY OF THIS METHODOLOGY IS >20 mIU/mL. Performed at [facility], [address] [place], [place] [address]   TSH     Status: None   Collection Time: 05/27/18  6:42 AM  Result Value Ref Range   TSH 2.940 0.350 - 4.500 uIU/mL    Comment: Performed by a 3rd Generation assay with a functional sensitivity of <=0.01 uIU/mL. Performed at [facility], [address] [place], [place] [address]     Blood Alcohol level:  Lab Results  Component Value Date   ETH <10 05/25/2018    Metabolic Disorder Labs: No results found for: HGBA1C, MPG No  results found for: PROLACTIN No results found for: CHOL, TRIG, HDL, CHOLHDL, VLDL, LDLCALC  Physical Findings: AIMS:  , ,  ,  ,    CIWA:    COWS:     Musculoskeletal: Strength & Muscle Tone: within normal limits Gait & Station: normal Patient leans: N/A  Psychiatric Specialty Exam: Physical Exam  Nursing  note and vitals reviewed. Constitutional: She is oriented to person, place, and time. She appears well-developed and well-nourished.  Cardiovascular: Normal rate.  Respiratory: Effort normal.  Neurological: She is alert and oriented to person, place, and time.    Review of Systems  Constitutional: Negative.   Psychiatric/Behavioral: Positive for substance abuse (UDS +THC). Negative for depression, hallucinations, memory loss and suicidal ideas. The patient is nervous/anxious and has insomnia.   No headache, no chest pain, no shortness of breath, no vomiting  Blood pressure 124/80, pulse 69, temperature 97.7 F (36.5 C), resp. rate 18, height 5\' 4"  (1.626 m), weight 59 kg, SpO2 100 %.Body mass index is 22.31 kg/m.  General Appearance: Well Groomed  Eye Contact:  Good  Speech:  Normal Rate  Volume:  Normal  Mood: Reports improved mood  Affect:  Appropriate, more reactive  Thought Process:  Coherent and Descriptions of Associations: Intact  Orientation:  Full (Time, Place, and Person)  Thought Content:  No hallucinations, no delusions  Suicidal Thoughts:  No denies suicidal or self-injurious ideations, contracts for safety on unit  Homicidal Thoughts:  No  Memory:  Recent and remote grossly intact  Judgement:  Other:  Improving  Insight:  Fair  Psychomotor Activity:  Normal  Concentration:  Concentration: Good  Recall:  Good  Fund of Knowledge:  Good  Language:  Good  Akathisia:  No  Handed:  Right  AIMS (if indicated):     Assets:  Communication Skills Desire for Improvement Housing Physical Health Resilience Social Support  ADL's:  Intact  Cognition:  WNL   Sleep:  Number of Hours: 6.75   Assessment: 38 year old female who presented to the unit after being transferred from the ED on 2/5 via EMS, after she overdosed on about 20 tablets of Carisoprodol ( states she obtained from a friend). States overdose was impulsive, unplanned, and states " really I think I was just trying to get high, I have been under a lot of stress lately and needed to relax".   Patient presents with improving mood and range of affect.  Denies suicidal ideations.  As she improves she is focusing more on discharging soon.  Behavior on unit in good control.  Thus far tolerating medications well.  Agreeing to initiating a gradual Xanax taper (we have reviewed rationale to minimize long-term benzodiazepine use).  Treatment Plan Summary: Daily contact with patient to assess and evaluate symptoms and progress in treatment and Medication management  Reviewed current treatment plan 05/28/2018.  Encourage group and milieu participation to work on Pharmacologist and symptom reduction Encourage efforts to avoid medication misuse/abuse Continue Trazodone to 100 mg PO QHS PRN insomnia Continue Xanax dose taper.  Today decreased to 1 mg po  In the morning (0800), 0.5 mg po in the afternoon (1200), and 1 mg po at bedtime (1700) for anxiety Continue Zoloft 150 mg PO daily for  Depression, anxiety Continue Vistaril 25 mg PO Q6HR PRN anxiety Continue Clindamycin 300 mg PO TID for dental infection Consider discharge soon if she continues to improve/stabilize  Nehemiah Massed , MD  Patient ID: Kiara Murillo, female   DOB: April 01, 1981, 38 y.o.   MRN: [id number]

## 2018-05-28 NOTE — Progress Notes (Addendum)
Patient ID: Kiara Murillo, female   DOB: Jan 02, 1981, 38 y.o.   MRN: [id number]  Nursing Progress Note [id number]  Data: Patient presents mildly anxious this morning but is pleasant and cooperative. Patient compliant with scheduled medications. Patient denies pain/physical complaints. Patient completed self-inventory sheet and rates depression, hopelessness, and anxiety 0,0,2 respectively. Patient rates their sleep and appetite as good/good respectively. Patient states goal for today is to "be a better person for my family when I get out". Patient is seen attending groups and interactive in the milieu. Patient is seen engaging with peers and nursing students. Patient currently denies SI/HI/AVH.   Action: Patient is educated about and provided medication per provider's orders. Patient safety maintained with q15 min safety checks and frequent rounding. Low fall risk precautions in place. Emotional support given. 1:1 interaction and active listening provided. Patient encouraged to attend meals, groups, and work on treatment plan and goals. Labs, vital signs and patient behavior monitored throughout shift.   Response: Patient remains safe on the unit at this time and agrees to come to staff with any issues/concerns. Patient is interacting with peers appropriately on the unit. Will continue to support and monitor.

## 2018-05-28 NOTE — Progress Notes (Signed)
Pt attended AA group this evening.  

## 2018-05-28 NOTE — [facility] Group Notes (Signed)
[facility] Group Notes: (Clinical Social Work)   05/28/2018      Type of Therapy:  Group Therapy   Participation Level:  Did Not Attend despite MHT prompting   Rea Kalama Grossman-Orr, LCSW 05/28/2018, 11:17 AM     
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
## 2018-05-28 NOTE — [facility] Suicide Risk Assessment (Addendum)
[facility] INPATIENT:  Family/Significant Other Suicide Prevention Education  Suicide Prevention Education:  Contact Attempts: children's father Kiara Murillo [phone], (name of family member/significant other) has been identified by the patient as the family member/significant other with whom the patient will be residing, and identified as the person(s) who will aid the patient in the event of a mental health crisis.  With written consent from the patient, two attempts were made to provide suicide prevention education, prior to and/or following the patient's discharge.  We were unsuccessful in providing suicide prevention education.  A suicide education pamphlet was given to the patient to share with family/significant other.  Date and time of first attempt:  05/28/2018  /  5:35pm Date and time of second attempt: 05/29/2018 / 8:42 AM   Suicide Prevention Education was reviewed thoroughly with patient, including risk factors, warning signs, and what to do.  Mobile Crisis services were described and that telephone number pointed out, with encouragement to patient to put this number in personal cell phone.  Brochure was provided to patient to share with natural supports.  Patient acknowledged the ways in which they are at risk, and how working through each of their issues can gradually start to reduce their risk factors.  Patient was encouraged to think of the information in the context of people in their own lives.  Patient denied having access to firearms  Patient verbalized understanding of information provided.  Patient endorsed a desire to live.      Kiara Murillo 05/28/2018, 5:36 PM

## 2018-05-29 DIAGNOSIS — T50911A Poisoning by multiple unspecified drugs, medicaments and biological substances, accidental (unintentional), initial encounter: Secondary | ICD-10-CM

## 2018-05-29 DIAGNOSIS — T50901A Poisoning by unspecified drugs, medicaments and biological substances, accidental (unintentional), initial encounter: Secondary | ICD-10-CM

## 2018-05-29 MED ORDER — ALPRAZOLAM 1 MG PO TABS: 1.0000 mg | ORAL_TABLET | Freq: Two times a day (BID) | ORAL | 0 refills | Status: DC

## 2018-05-29 MED ORDER — HYDROXYZINE HCL 25 MG PO TABS: 25.0000 mg | ORAL_TABLET | Freq: Four times a day (QID) | ORAL | 0 refills | Status: DC | PRN

## 2018-05-29 MED ORDER — SERTRALINE HCL 50 MG PO TABS
150.0000 mg | ORAL_TABLET | Freq: Every day | ORAL | Status: DC
  Filled 2018-05-29: qty 21

## 2018-05-29 MED ORDER — TRAZODONE HCL 100 MG PO TABS: 100.0000 mg | ORAL_TABLET | Freq: Every evening | ORAL | 0 refills | Status: DC | PRN

## 2018-05-29 MED ORDER — IBUPROFEN 600 MG PO TABS: 600.0000 mg | ORAL_TABLET | Freq: Four times a day (QID) | ORAL | 0 refills | Status: DC | PRN

## 2018-05-29 MED ORDER — NICOTINE POLACRILEX 2 MG MT GUM: 2.0000 mg | CHEWING_GUM | OROMUCOSAL | 0 refills | Status: AC | PRN

## 2018-05-29 MED ORDER — CLINDAMYCIN HCL 300 MG PO CAPS: 300.0000 mg | ORAL_CAPSULE | Freq: Three times a day (TID) | ORAL | 0 refills | Status: DC

## 2018-05-29 MED ORDER — SERTRALINE HCL 50 MG PO TABS: 150.0000 mg | ORAL_TABLET | Freq: Every day | ORAL | 0 refills | Status: DC

## 2018-05-29 MED ORDER — ALPRAZOLAM 0.5 MG PO TABS: 0.5000 mg | ORAL_TABLET | Freq: Every day | ORAL | 0 refills | Status: DC

## 2018-05-29 NOTE — [facility] Group Notes (Signed)
[facility] LCSW Group Therapy Note  05/29/2018   10:00-11:00AM  Type of Therapy and Topic:  Group Therapy:  Unhealthy versus Healthy Supports, Which Am I?  Participation Level:  Active   Description of Group:  Patients in this group were introduced to the concept that additional supports including self-support are an essential part of recovery.  Initially a discussion was held about the differences between healthy versus unhealthy supports.  Patients were asked to share what unhealthy supports in their lives need to be addressed, as well as what additional healthy supports could be added for greater help in reaching their goals.   A song entitled "My Own Hero" was played and a group discussion ensued in which patients stated they could relate to the song and it inspired them to realize they have be willing to help themselves in order to succeed, because other people cannot achieve sobriety or stability for them.  We discussed adding a variety of healthy supports to address the various needs in patient lives, including becoming more self-supportive.  Therapeutic Goals: 1)  Highlight the differences between healthy and unhealthy supports 2)  Suggest the importance of being a part of one's own support system 2)  Discuss reasons people in one's life may eventually be unable to be continually supportive  3)  Identify the patient's current support system and   4)  elicit commitments to add healthy supports and to become more conscious of being self-supportive   Summary of Patient Progress:  The patient expressed that the unhealthy support which needs to be addressed includes her sister who resents patient and some friends/acquaintances who do "bad things."  Healthy supports which add increased stability and happiness include her family, children, kids' father and his family, and best friend.  She is very willing to add professional supports.  Therapeutic Modalities:   Motivational  Interviewing Activity  Lynnell Chad

## 2018-05-29 NOTE — Progress Notes (Signed)
  [facility] Adult Case Management Discharge Plan :  Will you be returning to the same living situation after discharge:  Yes,  with children's father At discharge, do you have transportation home?: Yes,  friend Do you have the ability to pay for your medications: No.  No income, no insurance, states will ask her children's father for help  Release of information consent forms completed and turned in to Medical Records by CSW.   Patient to Follow up at: Follow-up Information    Milagros Evener, MD Follow up.   Specialty:  Psychiatry Why:  Your social worker will make a follow-up appointment with you for Dr. Evelene Croon and will call you at [phone] on Monday 05/30/2018 with the information.  Contact information: [address]Tyson Babinski [place] [address] [phone]           Next level of care provider has access to [facility] Link:no  Safety Planning and Suicide Prevention discussed: No.  Attempted 2 times with children's father.  Reviewed with patient thoroughly.  Asked her to go over with him, and gave her brochure.  Informed him on phone messages (2) that she had this information.  Have you used any form of tobacco in the last 30 days? (Cigarettes, Smokeless Tobacco, Cigars, and/or Pipes): Yes  Has patient been referred to the Quitline?: Patient refused referral  Patient has been referred for addiction treatment: N/A  Lynnell Chad, LCSW 05/29/2018, 8:28 AM

## 2018-05-29 NOTE — Progress Notes (Addendum)
Patient ID: Kiara PalmaLisa A Schumacher, female   DOB: June 24, 1980, 38 y.o.   MRN: [id number]  D: Pt alert and oriented on the unit.   A: Education, support, and encouragement provided. Discharge summary, medications and follow up appointments reviewed with pt. Suicide prevention resources provided, including "My 3 App." Pt's belongings in locker # 27 returned and belongings sheet signed.  R: Pt denies SI/HI, A/VH, pain, or any concerns at this time. Pt ambulatory on and off unit. Pt discharged to lobby.

## 2018-05-29 NOTE — Progress Notes (Signed)
[facility] Group Notes:  (Nursing/MHT/Case Management/Adjunct)  Date:  05/28/2018  Time:  2030  Type of Therapy:  wrap up group  Participation Level:  Active  Participation Quality:  Appropriate, Attentive, Sharing and Supportive  Affect:  Appropriate and Irritable  Cognitive:  Appropriate  Insight:  Improving  Engagement in Group:  Engaged  Modes of Intervention:  Clarification, Education and Support  Summary of Progress/Problems: Pt shared a positive of laughing and socializing today. If pt could change any one thing about her life it would be her living arrangements at the moment. Pt said she did have another option but it was 3.5 hrs away. Pt is grateful for her 4 kids.   Kiara Murillo S 05/29/2018, 2:25 AM

## 2018-05-29 NOTE — [facility] Suicide Risk Assessment (Addendum)
[facility] Discharge Suicide Risk Assessment   Principal Problem: MDD (major depressive disorder), recurrent episode, severe (HCC) Discharge Diagnoses: Principal Problem:   MDD (major depressive disorder), recurrent episode, severe (HCC) Active Problems:   Generalized anxiety disorder   Total Time spent with patient: 20 minutes  Musculoskeletal: Strength & Muscle Tone: within normal limits Gait & Station: normal Patient leans: N/A  Psychiatric Specialty Exam: ROS no headache, no chest pain, no shortness of breath, no vomiting, no fever or chills   Blood pressure 131/89, pulse 97, temperature 97.9 F (36.6 C), temperature source Oral, resp. rate 18, height 5\' 4"  (1.626 m), weight 59 kg, SpO2 100 %.Body mass index is 22.31 kg/m.  General Appearance: Well Groomed  Eye Contact::  Good  Speech:  Normal Rate409  Volume:  Normal  Mood:  presents euthymic, states " I feel a whole lot better"  Affect:  Appropriate and reactive, smiles at times appropriately  Thought Process:  Linear and Descriptions of Associations: Intact  Orientation:  Full (Time, Place, and Person)  Thought Content:  no hallucinations, no delusions   Suicidal Thoughts:  No denies any suicidal or self injurious ideations, denies homicidal or violent ideations  Homicidal Thoughts:  No  Memory:  recent and remote grossly intact   Judgement:  Other:  improving  Insight:  improving   Psychomotor Activity:  Normal  Concentration:  Good  Recall:  Good  Fund of Knowledge:Good  Language: Good  Akathisia:  Negative  Handed:  Right  AIMS (if indicated):     Assets:  Communication Skills Desire for Improvement Resilience  Sleep:  Number of Hours: 4.25  Cognition: WNL  ADL's:  Intact   Mental Status Per Nursing Assessment::   On Admission:  NA  Demographic Factors:  37, lives with BF, has 4 children, homemaker.  Loss Factors: Financial stressors, not having a car at this time  Historical Factors: No prior  psychiatric admissions, no prior suicidal attempts, no history of self cutting , history of depression  Risk Reduction Factors:   Sense of responsibility to family, Living with another person, especially a relative and Positive coping skills or problem solving skills  Continued Clinical Symptoms:  At this time patient is alert, attentive,well related, pleasant, mood improved , states she is feeling a lot better than she did at admission, affect is more reactive, smiles at times appropriately, no thought disorder, no SI or HI, no psychotic symptoms. Denies medication side effects.  We reviewed medication side effects. Behavior on unit in good control, pleasant on approach.  Cognitive Features That Contribute To Risk:  No gross cognitive deficits noted upon discharge. Is alert , attentive, and oriented x 3   Suicide Risk:  Mild:  Suicidal ideation of limited frequency, intensity, duration, and specificity.  There are no identifiable plans, no associated intent, mild dysphoria and related symptoms, good self-control (both objective and subjective assessment), few other risk factors, and identifiable protective factors, including available and accessible social support.  Follow-up Information    Milagros Evener, MD Follow up.   Specialty:  Psychiatry Why:  Your social worker will make a follow-up appointment with you for Dr. Evelene Croon and will call you at [phone] on Monday 05/30/2018 with the information.  Contact information: [address]Tyson Babinski [place] [address] [phone]           Plan Of Care/Follow-up recommendations:  Activity:  as tolerated  Diet:  regular Tests:  NA Other:  See below  Patient is expressing readiness for discharge and is leaving unit in good spirits, there are no current grounds for involuntary commitment. Plans to return home, states her BF will pick her up later today.  Plans to follow up as above.  Craige Cotta,  MD 05/29/2018, 9:17 AM

## 2018-05-29 NOTE — Progress Notes (Signed)
D: Patient observed up and visible in the milieu this evening. Interacting with peers. Behavior appropriate. Patient states, "I didn't sleep well last night. Is it possible that if that happens again I can get a repeat on my trazadone?" Patient's affect animated, mood anxious but pleasant. Denies pain, physical complaints.   A: Medicated per orders, prn trazadone and vistaril given per her request. Medication education provided. Level III obs in place for safety. Emotional support offered. Patient encouraged to complete Suicide Safety Plan before discharge. Encouraged to attend and participate in unit programming.    R: Patient verbalizes understanding of POC. On reassess, patient was still awake. One time repeat of trazadone 100mg  ordered and given. Patient sleeping on and off since. Patient denies SI/HI/AVH and remains safe on level III obs. Will continue to monitor throughout the night.

## 2018-05-29 NOTE — Discharge Summary (Addendum)
Physician Discharge Summary Note  Patient:  Kiara Murillo is an 38 y.o., female MRN:  [id number] DOB:  Sep 18, 1980 Patient phone:  [phone] (home)  Patient address:   [address] [place] [address],  Total Time spent with patient: 15 minutes  Date of Admission:  05/26/2018 Date of Discharge: 05/29/2018  Reason for Admission:  Overdose on 20 Soma tablets  Principal Problem: MDD (major depressive disorder), recurrent episode, severe (HCC) Discharge Diagnoses: Principal Problem:   MDD (major depressive disorder), recurrent episode, severe (HCC) Active Problems:   Generalized anxiety disorder   Drug overdose   Past Psychiatric History: Per admission H&P: no history of prior suicidal attempts, no prior psychiatric admissions  , no history of self cutting, denies history of psychosis. Reports history of depressive episodes in the past, particularly after losing family members ( grandmother died 2011/09/01, brother died 2102-09-01). Denies history of mania. Endorses history of anxiety, characterizes as tendency to worry excessively, generalized worry. Denies panic attacks. Denies history of eating disorder. Denies history of violence   Past Medical History:  Past Medical History:  Diagnosis Date  . Anxiety   . Chondromalacia of patella   . Chronic pain syndrome   . Chronic pelvic pain in female   . Congenital spondylolisthesis   . Congenital spondylolisthesis   . Depression   . Headache(784.0) hemiplegic migraine  . Low back pain   . Lumbar spondylolysis   . Spinal stenosis in cervical region   . Spinal stenosis in cervical region     Past Surgical History:  Procedure Laterality Date  . CESAREAN SECTION    . MOUTH SURGERY    . TONSILLECTOMY    . TUBAL LIGATION     Family History: History reviewed. No pertinent family history. Family Psychiatric  History: Per admission H&P: denies history of mental illness in family. Denies suicides in family. Reports history of alcohol abuse in extended  family. Social History:  Social History   Substance and Sexual Activity  Alcohol Use No     Social History   Substance and Sexual Activity  Drug Use No    Social History   Socioeconomic History  . Marital status: Single    Spouse name: Not on file  . Number of children: Not on file  . Years of education: Not on file  . Highest education level: Not on file  Occupational History  . Not on file  Social Needs  . Financial resource strain: Not on file  . Food insecurity:    Worry: Not on file    Inability: Not on file  . Transportation needs:    Medical: Not on file    Non-medical: Not on file  Tobacco Use  . Smoking status: Current Every Day Smoker    Packs/day: 1.00    Types: Cigarettes  . Smokeless tobacco: Never Used  Substance and Sexual Activity  . Alcohol use: No  . Drug use: No  . Sexual activity: Not on file  Lifestyle  . Physical activity:    Days per week: Not on file    Minutes per session: Not on file  . Stress: Not on file  Relationships  . Social connections:    Talks on phone: Not on file    Gets together: Not on file    Attends religious service: Not on file    Active member of club or organization: Not on file    Attends meetings of clubs or organizations: Not on file  Relationship status: Not on file  Other Topics Concern  . Not on file  Social History Narrative  . Not on file    Hospital Course:  Per admission H&P 05/26/2018: 38 year old female, presented to ED on 2/5 via EMS, after she overdosed on about 20 tablets of Carisoprodol ( states she obtained from a friend). States overdose was impulsive, unplanned, and states " really I think I was just trying to get high, I have been under a lot of stress lately and needed to relax". Patient states that this was the first time she abused the Carisoprodol, and that she had not taken any prior to day of admission. States after overdose she was slurring words and was sedated so BF called EMS. She  reports stressors have included financial difficulties, not having a car. Chart notes indicate that patient had reported thoughts of " not wanting to live ", but patient denies and states " I was not trying to kill myself, I have a lot to live for". At this time denies significant neuro-vegetative symptoms of depression, and denies anhedonia, pervasive sense of sadness or any recent suicidal ideations. Denies changes in energy or appetite, does endorse some insomnia. Denies psychotic symptoms. States she had started on Xanax, Seroquel about a month ago due to anxiety and tendency to " worry about everything". Describes generalized worrying , anxiety. Denies panic attacks.  [name] was admitted after an overdose on 8220 Soma tablets. She minimized her overdose throughout admission and denied any suicidal ideation. She was taking Xanax 1 mg QID prior to admission. She agreed to a gradual taper off of Xanax. She is discharging on Xanax 1 mg BID and 0.5 mg daily. Zoloft was increased to 150 mg daily. Vistaril was started as needed for anxiety, and trazodone was started as needed for sleep. Clindamycin was continued for a dental infection. [name] She stabilized with medication and therapy. She was discharged on the medications listed below. She has shown improvement with improved mood, affect, and interaction. She denies any SI/HI/AVH and contracts for safety. She agrees to follow up with Dr. Evelene CroonKaur. She is provided with prescriptions and medication samples upon discharge.  Physical Findings: AIMS:  , ,  ,  ,    CIWA:    COWS:     Musculoskeletal: Strength & Muscle Tone: within normal limits Gait & Station: normal Patient leans: N/A  Psychiatric Specialty Exam: Physical Exam  Nursing note and vitals reviewed. Constitutional: She is oriented to person, place, and time. She appears well-developed and well-nourished.  Cardiovascular: Normal rate.  Respiratory: Effort  normal.  Neurological: She is alert and oriented to person, place, and time.    Review of Systems  Constitutional: Negative.   Psychiatric/Behavioral: Positive for depression (improving) and substance abuse (UDS +THC). Negative for hallucinations, memory loss and suicidal ideas. The patient is not nervous/anxious and does not have insomnia.     Blood pressure 131/89, pulse 97, temperature 97.9 F (36.6 C), temperature source Oral, resp. rate 18, height 5\' 4"  (1.626 m), weight 59 kg, SpO2 100 %.Body mass index is 22.31 kg/m.  See MD's discharge SRA     Have you used any form of tobacco in the last 30 days? (Cigarettes, Smokeless Tobacco, Cigars, and/or Pipes): Yes  Has this patient used any form of tobacco in the last 30 days? (Cigarettes, Smokeless Tobacco, Cigars, and/or Pipes) Yes, a prescription for an FDA-approved medication for tobacco cessation  was offered at discharge.   Blood Alcohol level:  Lab Results  Component Value Date   ETH <10 05/25/2018    Metabolic Disorder Labs:  No results found for: HGBA1C, MPG No results found for: PROLACTIN No results found for: CHOL, TRIG, HDL, CHOLHDL, VLDL, LDLCALC  See Psychiatric Specialty Exam and Suicide Risk Assessment completed by Attending Physician prior to discharge.  Discharge destination:  Home  Is patient on multiple antipsychotic therapies at discharge:  No   Has Patient had three or more failed trials of antipsychotic monotherapy by history:  No  Recommended Plan for Multiple Antipsychotic Therapies: NA  Discharge Instructions    Discharge instructions   Complete by:  As directed    Patient is instructed to take all prescribed medications as recommended. Report any side effects or adverse reactions to your outpatient psychiatrist. Patient is instructed to abstain from alcohol and illegal drugs while on prescription medications. In the event of worsening symptoms, patient is instructed to call the crisis hotline, 911,  or go to the nearest emergency department for evaluation and treatment.     Allergies as of 05/29/2018      Reactions   Hydrocodone-acetaminophen Other (See Comments)   Causes migraines over prolonged treatment use headache   Eggs Or Egg-derived Products    Gabapentin Other (See Comments)   Felt dizzy and fell once   Hydrocodone Nausea And Vomiting, Other (See Comments)   migraines   Toradol [ketorolac Tromethamine]    "made me feel depressed"   Tramadol Nausea And Vomiting   Metronidazole Rash      Medication List    TAKE these medications     Indication  ALPRAZolam 0.5 MG tablet Commonly known as:  XANAX Take 1 tablet (0.5 mg total) by mouth daily at 12 noon. For anxiety What changed:    medication strength  how much to take  when to take this  additional instructions  Indication:  Feeling Anxious, xanax   ALPRAZolam 1 MG tablet Commonly known as:  XANAX Take 1 tablet (1 mg total) by mouth 2 (two) times daily. For anxiety What changed:  You were already taking a medication with the same name, and this prescription was added. Make sure you understand how and when to take each.  Indication:  Feeling Anxious   clindamycin 300 MG capsule Commonly known as:  CLEOCIN Take 1 capsule (300 mg total) by mouth every 8 (eight) hours. For dental infection What changed:    when to take this  additional instructions  Indication:  Dental infection   hydrOXYzine 25 MG tablet Commonly known as:  ATARAX/VISTARIL Take 1 tablet (25 mg total) by mouth every 6 (six) hours as needed for anxiety.  Indication:  Feeling Anxious   ibuprofen 600 MG tablet Commonly known as:  ADVIL,MOTRIN Take 1 tablet (600 mg total) by mouth every 6 (six) hours as needed for moderate pain. (May buy over the counter) What changed:    medication strength  how much to take  when to take this  additional instructions  Indication:  Pain   nicotine polacrilex 2 MG gum Commonly known as:   NICORETTE Take 1 each (2 mg total) by mouth as needed for smoking cessation.  Indication:  Nicotine Addiction   sertraline 50 MG tablet Commonly known as:  ZOLOFT Take 3 tablets (150 mg total) by mouth daily. For mood Start taking on:  May 30, 2018 What changed:    medication strength  how much to take  additional instructions  Indication:  Major Depressive Disorder   traZODone 100 MG tablet Commonly known as:  DESYREL Take 1 tablet (100 mg total) by mouth at bedtime as needed for sleep.  Indication:  Trouble Sleeping      Follow-up Information    Milagros Evener, MD Follow up.   Specialty:  Psychiatry Why:  Your social worker will make a follow-up appointment with you for Dr. Evelene Croon and will call you at [phone] on Monday 05/30/2018 with the information.  Contact information: [address] [place] [address] [phone]           Follow-up recommendations: Activity as tolerated. Diet as recommended by primary care physician. Keep all scheduled follow-up appointments as recommended.   Comments:   Patient is instructed to take all prescribed medications as recommended. Report any side effects or adverse reactions to your outpatient psychiatrist. Patient is instructed to abstain from alcohol and illegal drugs while on prescription medications. In the event of worsening symptoms, patient is instructed to call the crisis hotline, 911, or go to the nearest emergency department for evaluation and treatment.  Signed: Aldean Baker, NP 05/29/2018, 4:13 PM   Patient seen, Suicide Assessment Completed.  Disposition Plan Reviewed

## 2019-05-15 ENCOUNTER — Other Ambulatory Visit: Payer: Self-pay

## 2019-05-15 ENCOUNTER — Emergency Department (HOSPITAL_COMMUNITY): Payer: Medicaid Other

## 2019-05-15 ENCOUNTER — Emergency Department (HOSPITAL_COMMUNITY)
Admission: EM | Admit: 2019-05-15 | Discharge: 2019-05-15 | Disposition: A | Discharge status: Home / Self Care | Payer: Medicaid Other | Attending: Emergency Medicine | Admitting: Emergency Medicine

## 2019-05-15 DIAGNOSIS — F17210 Nicotine dependence, cigarettes, uncomplicated: Secondary | ICD-10-CM | POA: Diagnosis not present

## 2019-05-15 DIAGNOSIS — Y9289 Other specified places as the place of occurrence of the external cause: Secondary | ICD-10-CM | POA: Insufficient documentation

## 2019-05-15 DIAGNOSIS — S0081XA Abrasion of other part of head, initial encounter: Secondary | ICD-10-CM | POA: Diagnosis not present

## 2019-05-15 DIAGNOSIS — Q762 Congenital spondylolisthesis: Secondary | ICD-10-CM | POA: Diagnosis not present

## 2019-05-15 DIAGNOSIS — Y9389 Activity, other specified: Secondary | ICD-10-CM | POA: Insufficient documentation

## 2019-05-15 DIAGNOSIS — R4182 Altered mental status, unspecified: Secondary | ICD-10-CM | POA: Diagnosis not present

## 2019-05-15 DIAGNOSIS — W050XXA Fall from non-moving wheelchair, initial encounter: Secondary | ICD-10-CM | POA: Diagnosis not present

## 2019-05-15 DIAGNOSIS — Y999 Unspecified external cause status: Secondary | ICD-10-CM | POA: Insufficient documentation

## 2019-05-15 DIAGNOSIS — S0993XA Unspecified injury of face, initial encounter: Secondary | ICD-10-CM | POA: Diagnosis present

## 2019-05-15 DIAGNOSIS — Z79899 Other long term (current) drug therapy: Secondary | ICD-10-CM | POA: Diagnosis not present

## 2019-05-15 DIAGNOSIS — Z7982 Long term (current) use of aspirin: Secondary | ICD-10-CM | POA: Insufficient documentation

## 2019-05-15 LAB — COMPREHENSIVE METABOLIC PANEL
ALT: 47 U/L — ABNORMAL HIGH (ref 0–44)
AST: 26 U/L (ref 15–41)
Albumin: 3.9 g/dL (ref 3.5–5.0)
Alkaline Phosphatase: 161 U/L — ABNORMAL HIGH (ref 38–126)
Anion gap: 10 (ref 5–15)
BUN: 7 mg/dL (ref 6–20)
CO2: 24 mmol/L (ref 22–32)
Calcium: 9.5 mg/dL (ref 8.9–10.3)
Chloride: 105 mmol/L (ref 98–111)
Creatinine, Ser: 0.62 mg/dL (ref 0.44–1.00)
GFR calc Af Amer: >60 mL/min (ref >60)
GFR calc non Af Amer: >60 mL/min (ref >60)
Glucose, Bld: 93 mg/dL (ref 70–99)
Potassium: 3.6 mmol/L (ref 3.5–5.1)
Sodium: 139 mmol/L (ref 135–145)
Total Bilirubin: 0.1 mg/dL — ABNORMAL LOW (ref 0.3–1.2)
Total Protein: 6.9 g/dL (ref 6.5–8.1)

## 2019-05-15 LAB — CBC WITH DIFFERENTIAL/PLATELET
Abs Immature Granulocytes: 0.05 10*3/uL (ref 0.00–0.07)
Basophils Absolute: 0 10*3/uL (ref 0.0–0.1)
Basophils Relative: 0 %
Eosinophils Absolute: 0.1 10*3/uL (ref 0.0–0.5)
Eosinophils Relative: 1 %
HCT: 41.8 % (ref 36.0–46.0)
Hemoglobin: 13.4 g/dL (ref 12.0–15.0)
Immature Granulocytes: 0 %
Lymphocytes Relative: 25 %
Lymphs Abs: 3.1 10*3/uL (ref 0.7–4.0)
MCH: 31.2 pg (ref 26.0–34.0)
MCHC: 32.1 g/dL (ref 30.0–36.0)
MCV: 97.4 fL (ref 80.0–100.0)
Monocytes Absolute: 0.7 10*3/uL (ref 0.1–1.0)
Monocytes Relative: 6 %
Neutro Abs: 8.7 10*3/uL — ABNORMAL HIGH (ref 1.7–7.7)
Neutrophils Relative %: 68 %
Platelets: 303 10*3/uL (ref 150–400)
RBC: 4.29 MIL/uL (ref 3.87–5.11)
RDW: 13.9 % (ref 11.5–15.5)
WBC: 12.6 10*3/uL — ABNORMAL HIGH (ref 4.0–10.5)
nRBC: 0 % (ref 0.0–0.2)

## 2019-05-15 LAB — ACETAMINOPHEN LEVEL: Acetaminophen (Tylenol), Serum: <10 ug/mL — ABNORMAL LOW (ref 10–30)

## 2019-05-15 LAB — ETHANOL: Alcohol, Ethyl (B): <10 mg/dL (ref <10)

## 2019-05-15 LAB — SALICYLATE LEVEL: Salicylate Lvl: <7 mg/dL — ABNORMAL LOW (ref 7.0–30.0)

## 2019-05-15 LAB — LACTIC ACID, PLASMA: Lactic Acid, Venous: 2.1 mmol/L (ref 0.5–1.9)

## 2019-05-15 MED ORDER — NALOXONE HCL 2 MG/2ML IJ SOSY
1.0000 mg | PREFILLED_SYRINGE | Freq: Once | INTRAMUSCULAR | Status: DC
  Filled 2019-05-15: qty 2

## 2019-05-15 NOTE — ED Notes (Signed)
Pt verbalized understanding of discharge instructions. Pt's son is on the way to pick her up.
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
## 2019-05-15 NOTE — ED Notes (Signed)
Kiara Murillo husband [phone] looking for an update on the patient

## 2019-05-15 NOTE — ED Provider Notes (Signed)
[facility] EMERGENCY DEPARTMENT Provider Note   CSN: [id number] Arrival date & time: 05/15/19  1936     History Chief Complaint  Patient presents with  . Altered Mental Status    Kiara Murillo is a 39 y.o. female.  HPI Patient presents to the emergency department with altered mental status after being found by friends laying out on the ground.  The patient was smoking a cigarette and her friends came back outside and she was laying on the ground.  The patient is unable to give any history due to her altered mental status.  EMS reported that there was Valium that they found at the scene.    Past Medical History:  Diagnosis Date  . Anxiety   . Chondromalacia of patella   . Chronic pain syndrome   . Chronic pelvic pain in female   . Congenital spondylolisthesis   . Congenital spondylolisthesis   . Depression   . Headache(784.0) hemiplegic migraine  . Low back pain   . Lumbar spondylolysis   . Spinal stenosis in cervical region   . Spinal stenosis in cervical region     Patient Active Problem List   Diagnosis Date Noted  . Drug overdose   . Generalized anxiety disorder 05/27/2018  . MDD (major depressive disorder), recurrent episode, severe (HCC) 05/26/2018  . S/P tubal ligation 12/02/2012  . No narcotics to be prescribed by [facility] 01/19/2012  . Severe major depression with psychotic features (HCC) 09/04/2011  . Chronic pain syndrome 09/01/2010  . MIGRAINE, HEMIPLEGIC 11/22/2009  . CHONDROMALACIA OF PATELLA 11/15/2009  . SPONDYLOSIS, LUMBAR 05/16/2009  . LOW BACK PAIN 12/26/2008  . PELVIC PAIN, CHRONIC 11/23/2008  . Post traumatic stress disorder (PTSD) 01/28/2007  . TOBACCO DEPENDENCE 06/17/2006  . CERVICAL SPINE DISORDER, NOS 06/17/2006    Past Surgical History:  Procedure Laterality Date  . CESAREAN SECTION    . MOUTH SURGERY    . TONSILLECTOMY    . TUBAL LIGATION       OB History   No obstetric history on file.     No family history  on file.  Social History   Tobacco Use  . Smoking status: Current Every Day Smoker    Packs/day: 1.00    Types: Cigarettes  . Smokeless tobacco: Never Used  Substance Use Topics  . Alcohol use: No  . Drug use: No    Home Medications Prior to Admission medications   Medication Sig Start Date End Date Taking? Authorizing Provider  aspirin 81 MG chewable tablet Chew 81 mg by mouth daily.   Yes [provider]  gabapentin (NEURONTIN) 100 MG capsule Take 100 mg by mouth 2 (two) times daily.   Yes [provider]  levothyroxine (SYNTHROID) 25 MCG tablet Take 25 mcg by mouth daily before breakfast.   Yes [provider]  methocarbamol (ROBAXIN) 500 MG tablet Take 500 mg by mouth 4 (four) times daily.   Yes [provider]  oxycodone (OXY-IR) 5 MG capsule Take 5 mg by mouth every 4 (four) hours.   Yes [provider]  ALPRAZolam (XANAX) 0.5 MG tablet Take 1 tablet (0.5 mg total) by mouth daily at 12 noon. For anxiety Patient not taking: Reported on 05/15/2019 05/29/18   Aldean Baker, NP  ALPRAZolam Prudy Feeler) 1 MG tablet Take 1 tablet (1 mg total) by mouth 2 (two) times daily. For anxiety Patient not taking: Reported on 05/15/2019 05/29/18   Aldean Baker, NP  clindamycin (CLEOCIN)  300 MG capsule Take 1 capsule (300 mg total) by mouth every 8 (eight) hours. For dental infection Patient not taking: Reported on 05/15/2019 05/29/18   Aldean Baker, NP  hydrOXYzine (ATARAX/VISTARIL) 25 MG tablet Take 1 tablet (25 mg total) by mouth every 6 (six) hours as needed for anxiety. Patient not taking: Reported on 05/15/2019 05/29/18   Aldean Baker, NP  ibuprofen (ADVIL,MOTRIN) 600 MG tablet Take 1 tablet (600 mg total) by mouth every 6 (six) hours as needed for moderate pain. (May buy over the counter) Patient not taking: Reported on 05/15/2019 05/29/18   Aldean Baker, NP  nicotine polacrilex (NICORETTE) 2 MG gum Take 1 each (2 mg total) by mouth as needed for smoking  cessation. Patient not taking: Reported on 05/15/2019 05/29/18   Aldean Baker, NP  sertraline (ZOLOFT) 50 MG tablet Take 3 tablets (150 mg total) by mouth daily. For mood Patient not taking: Reported on 05/15/2019 05/30/18   Aldean Baker, NP  traZODone (DESYREL) 100 MG tablet Take 1 tablet (100 mg total) by mouth at bedtime as needed for sleep. Patient not taking: Reported on 05/15/2019 05/29/18   Aldean Baker, NP    Allergies    Hydrocodone-acetaminophen, Eggs or egg-derived products, Gabapentin, Hydrocodone, Toradol [ketorolac tromethamine], Tramadol, and Metronidazole  Review of Systems   Review of Systems Level 5 caveat applies due to altered mental status Physical Exam Updated Vital Signs BP 113/89   Pulse 88   Temp 98.5 F (36.9 C) (Oral)   Resp (!) 21   SpO2 100%   Physical Exam Vitals and nursing note reviewed.  Constitutional:      General: She is not in acute distress.    Appearance: She is well-developed.  HENT:     Head: Normocephalic.   Eyes:     Pupils: Pupils are equal, round, and reactive to light.  Cardiovascular:     Rate and Rhythm: Normal rate and regular rhythm.     Heart sounds: Normal heart sounds. No murmur. No friction rub. No gallop.   Pulmonary:     Effort: Pulmonary effort is normal. No respiratory distress.     Breath sounds: Normal breath sounds. No wheezing.  Abdominal:     General: Bowel sounds are normal. There is no distension.     Palpations: Abdomen is soft.     Tenderness: There is no abdominal tenderness.  Musculoskeletal:     Cervical back: Normal range of motion and neck supple.  Skin:    General: Skin is warm and dry.     Capillary Refill: Capillary refill takes less than 2 seconds.     Findings: No erythema or rash.  Neurological:     Mental Status: She is alert.     Motor: No abnormal muscle tone.     Coordination: Coordination normal.     Comments: Patient will arouse slightly to voice and painful stimuli she will answer  some questions and then goes back to sleep.  Psychiatric:        Behavior: Behavior normal.     ED Results / Procedures / Treatments   Labs (all labs ordered are listed, but only abnormal results are displayed) Labs Reviewed  COMPREHENSIVE METABOLIC PANEL - Abnormal; Notable for the following components:      Result Value   ALT 47 (*)    Alkaline Phosphatase 161 (*)    Total Bilirubin 0.1 (*)    All other components within normal limits  SALICYLATE  LEVEL - Abnormal; Notable for the following components:   Salicylate Lvl <3.2 (*)    All other components within normal limits  LACTIC ACID, PLASMA - Abnormal; Notable for the following components:   Lactic Acid, Venous 2.1 (*)    All other components within normal limits  CBC WITH DIFFERENTIAL/PLATELET - Abnormal; Notable for the following components:   WBC 12.6 (*)    Neutro Abs 8.7 (*)    All other components within normal limits  ACETAMINOPHEN LEVEL - Abnormal; Notable for the following components:   Acetaminophen (Tylenol), Serum <10 (*)    All other components within normal limits  ETHANOL  URINALYSIS, ROUTINE W REFLEX MICROSCOPIC  RAPID URINE DRUG SCREEN, HOSP PERFORMED  PREGNANCY, URINE    EKG EKG Interpretation  Date/Time:  Monday May 15 2019 19:49:32 EST Ventricular Rate:  95 PR Interval:    QRS Duration: 83 QT Interval:  356 QTC Calculation: 448 R Axis:   79 Text Interpretation: Sinus rhythm normal ekg. no sig change from previous Confirmed by Charlesetta Shanks [phone]) on 05/15/2019 9:08:20 PM   Radiology CT Head Wo Contrast  Result Date: 05/15/2019 CLINICAL DATA:  Head trauma, altered mental status EXAM: CT HEAD WITHOUT CONTRAST TECHNIQUE: Contiguous axial images were obtained from the base of the skull through the vertex without intravenous contrast. COMPARISON:  11/16/2010 FINDINGS: Brain: No acute intracranial abnormality. Specifically, no hemorrhage, hydrocephalus, mass lesion, acute infarction, or  significant intracranial injury. Vascular: No hyperdense vessel or unexpected calcification. Skull: No acute calvarial abnormality. Sinuses/Orbits: Visualized paranasal sinuses and mastoids clear. Orbital soft tissues unremarkable. Other: None IMPRESSION: Normal study. Electronically Signed   By: Rolm Baptise M.D.   On: 05/15/2019 21:30   CT Cervical Spine Wo Contrast  Result Date: 05/15/2019 CLINICAL DATA:  Fall EXAM: CT CERVICAL SPINE WITHOUT CONTRAST TECHNIQUE: Multidetector CT imaging of the cervical spine was performed without intravenous contrast. Multiplanar CT image reconstructions were also generated. COMPARISON:  10/02/2011 FINDINGS: Alignment: Loss of cervical lordosis.  No subluxation. Skull base and vertebrae: No acute fracture. No primary bone lesion or focal pathologic process. Soft tissues and spinal canal: No prevertebral fluid or swelling. No visible canal hematoma. Disc levels:  Diffuse degenerative disc and facet disease. Upper chest: No acute findings Other: None IMPRESSION: No acute bony abnormality. Diffuse spondylosis. Loss of cervical lordosis which may be positional or related to muscle spasm. Electronically Signed   By: Rolm Baptise M.D.   On: 05/15/2019 21:31    Procedures Procedures (including critical care time)  Medications Ordered in ED Medications  naloxone (NARCAN) injection 1 mg (has no administration in time range)    ED Course  I have reviewed the triage vital signs and the nursing notes.  Pertinent labs & imaging results that were available during my care of the patient were reviewed by me and considered in my medical decision making (see chart for details).    MDM Rules/Calculators/A&P                      Recheck of the the patient found that she is sitting up talking on the phone to her fianc.  She is alert and oriented x3.  At this point I feel that the patient most of utilize some medication caused her to have alteration.  At this point she is alert  and oriented and has no significant lab abnormalities and her CT scans of the head and neck were negative.  Patient has remained stable with  vital signs here in the emergency department as well.  Patient is stating that she feels no abnormalities at this time.  She states she is unsure of what caused her to fall out of the wheelchair.  She does have an abrasion to the middle of her head just in between her eyebrows.  Final Clinical Impression(s) / ED Diagnoses Final diagnoses:  None    Rx / DC Orders ED Discharge Orders    None       Kyra Manges 05/15/19 2237    Arby Barrette, MD 05/15/19 [phone]

## 2019-05-15 NOTE — Discharge Instructions (Signed)
Return here as needed. Follow up with your primary

## 2019-07-19 ENCOUNTER — Other Ambulatory Visit: Payer: Self-pay

## 2019-07-19 ENCOUNTER — Encounter (HOSPITAL_COMMUNITY): Payer: Self-pay | Admitting: Emergency Medicine

## 2019-07-19 ENCOUNTER — Emergency Department (HOSPITAL_COMMUNITY)
Admission: EM | Admit: 2019-07-19 | Discharge: 2019-07-20 | Disposition: A | Discharge status: Home / Self Care | Payer: Medicaid Other | Attending: Emergency Medicine | Admitting: Emergency Medicine

## 2019-07-19 DIAGNOSIS — F1120 Opioid dependence, uncomplicated: Secondary | ICD-10-CM | POA: Insufficient documentation

## 2019-07-19 DIAGNOSIS — F1990 Other psychoactive substance use, unspecified, uncomplicated: Secondary | ICD-10-CM | POA: Diagnosis present

## 2019-07-19 DIAGNOSIS — F332 Major depressive disorder, recurrent severe without psychotic features: Secondary | ICD-10-CM | POA: Diagnosis present

## 2019-07-19 DIAGNOSIS — F4310 Post-traumatic stress disorder, unspecified: Secondary | ICD-10-CM | POA: Diagnosis present

## 2019-07-19 DIAGNOSIS — Z20822 Contact with and (suspected) exposure to covid-19: Secondary | ICD-10-CM | POA: Diagnosis not present

## 2019-07-19 DIAGNOSIS — Z046 Encounter for general psychiatric examination, requested by authority: Secondary | ICD-10-CM

## 2019-07-19 DIAGNOSIS — T50901A Poisoning by unspecified drugs, medicaments and biological substances, accidental (unintentional), initial encounter: Secondary | ICD-10-CM | POA: Insufficient documentation

## 2019-07-19 DIAGNOSIS — Z7982 Long term (current) use of aspirin: Secondary | ICD-10-CM | POA: Insufficient documentation

## 2019-07-19 DIAGNOSIS — T50911A Poisoning by multiple unspecified drugs, medicaments and biological substances, accidental (unintentional), initial encounter: Secondary | ICD-10-CM

## 2019-07-19 DIAGNOSIS — F17210 Nicotine dependence, cigarettes, uncomplicated: Secondary | ICD-10-CM | POA: Diagnosis not present

## 2019-07-19 DIAGNOSIS — F411 Generalized anxiety disorder: Secondary | ICD-10-CM | POA: Diagnosis present

## 2019-07-19 DIAGNOSIS — Z79899 Other long term (current) drug therapy: Secondary | ICD-10-CM | POA: Insufficient documentation

## 2019-07-19 LAB — CBC WITH DIFFERENTIAL/PLATELET
Abs Immature Granulocytes: 0.03 10*3/uL (ref 0.00–0.07)
Basophils Absolute: 0 10*3/uL (ref 0.0–0.1)
Basophils Relative: 0 %
Eosinophils Absolute: 0.2 10*3/uL (ref 0.0–0.5)
Eosinophils Relative: 2 %
HCT: 42.8 % (ref 36.0–46.0)
Hemoglobin: 13.6 g/dL (ref 12.0–15.0)
Immature Granulocytes: 0 %
Lymphocytes Relative: 25 %
Lymphs Abs: 2.7 10*3/uL (ref 0.7–4.0)
MCH: 31.4 pg (ref 26.0–34.0)
MCHC: 31.8 g/dL (ref 30.0–36.0)
MCV: 98.8 fL (ref 80.0–100.0)
Monocytes Absolute: 0.6 10*3/uL (ref 0.1–1.0)
Monocytes Relative: 6 %
Neutro Abs: 7 10*3/uL (ref 1.7–7.7)
Neutrophils Relative %: 67 %
Platelets: 305 10*3/uL (ref 150–400)
RBC: 4.33 MIL/uL (ref 3.87–5.11)
RDW: 13.6 % (ref 11.5–15.5)
WBC: 10.6 10*3/uL — ABNORMAL HIGH (ref 4.0–10.5)
nRBC: 0 % (ref 0.0–0.2)

## 2019-07-19 LAB — COMPREHENSIVE METABOLIC PANEL
ALT: 32 U/L (ref 0–44)
AST: 38 U/L (ref 15–41)
Albumin: 3.9 g/dL (ref 3.5–5.0)
Alkaline Phosphatase: 125 U/L (ref 38–126)
Anion gap: 9 (ref 5–15)
BUN: 6 mg/dL (ref 6–20)
CO2: 25 mmol/L (ref 22–32)
Calcium: 8.7 mg/dL — ABNORMAL LOW (ref 8.9–10.3)
Chloride: 106 mmol/L (ref 98–111)
Creatinine, Ser: 0.61 mg/dL (ref 0.44–1.00)
GFR calc Af Amer: >60 mL/min (ref >60)
GFR calc non Af Amer: >60 mL/min (ref >60)
Glucose, Bld: 88 mg/dL (ref 70–99)
Potassium: 3.5 mmol/L (ref 3.5–5.1)
Sodium: 140 mmol/L (ref 135–145)
Total Bilirubin: 0.3 mg/dL (ref 0.3–1.2)
Total Protein: 7.4 g/dL (ref 6.5–8.1)

## 2019-07-19 LAB — RAPID URINE DRUG SCREEN, HOSP PERFORMED
Amphetamines: NOT DETECTED
Barbiturates: NOT DETECTED
Benzodiazepines: POSITIVE — AB
Cocaine: NOT DETECTED
Opiates: POSITIVE — AB
Tetrahydrocannabinol: POSITIVE — AB

## 2019-07-19 LAB — I-STAT BETA HCG BLOOD, ED (MC, WL, AP ONLY)
I-stat hCG, quantitative: <5 m[IU]/mL (ref <5)
I-stat hCG, quantitative: <5 m[IU]/mL (ref <5)

## 2019-07-19 LAB — SALICYLATE LEVEL: Salicylate Lvl: <7 mg/dL — ABNORMAL LOW (ref 7.0–30.0)

## 2019-07-19 LAB — ACETAMINOPHEN LEVEL: Acetaminophen (Tylenol), Serum: <10 ug/mL — ABNORMAL LOW (ref 10–30)

## 2019-07-19 LAB — RESPIRATORY PANEL BY RT PCR (FLU A&B, COVID)
Influenza A by PCR: NEGATIVE
Influenza B by PCR: NEGATIVE
SARS Coronavirus 2 by RT PCR: NEGATIVE

## 2019-07-19 LAB — ETHANOL: Alcohol, Ethyl (B): <10 mg/dL (ref <10)

## 2019-07-19 MED ORDER — QUETIAPINE FUMARATE 25 MG PO TABS: 25.0000 mg | ORAL_TABLET | Freq: Three times a day (TID) | ORAL | 0 refills | Status: DC

## 2019-07-19 MED ORDER — SODIUM CHLORIDE 0.9 % IV BOLUS
1000.0000 mL | Freq: Once | INTRAVENOUS | Status: AC
  Administered 2019-07-19: 1000 mL via INTRAVENOUS

## 2019-07-19 MED ORDER — IBUPROFEN 200 MG PO TABS
600.0000 mg | ORAL_TABLET | Freq: Four times a day (QID) | ORAL | Status: DC | PRN
  Administered 2019-07-19 – 2019-07-20 (×2): 600 mg via ORAL
  Filled 2019-07-19 (×2): qty 3

## 2019-07-19 MED ORDER — HYDROXYZINE HCL 25 MG PO TABS
25.0000 mg | ORAL_TABLET | Freq: Four times a day (QID) | ORAL | Status: DC | PRN
  Administered 2019-07-20: 25 mg via ORAL
  Filled 2019-07-19: qty 1

## 2019-07-19 MED ORDER — ONDANSETRON 4 MG PO TBDP
4.0000 mg | ORAL_TABLET | Freq: Four times a day (QID) | ORAL | Status: DC | PRN
  Filled 2019-07-19: qty 1

## 2019-07-19 MED ORDER — LEVOTHYROXINE SODIUM 25 MCG PO TABS
25.0000 ug | ORAL_TABLET | Freq: Every day | ORAL | Status: DC
  Administered 2019-07-20: 25 ug via ORAL
  Filled 2019-07-19: qty 1

## 2019-07-19 MED ORDER — CHLORDIAZEPOXIDE HCL 25 MG PO CAPS
25.0000 mg | ORAL_CAPSULE | Freq: Four times a day (QID) | ORAL | Status: DC | PRN
  Filled 2019-07-19: qty 1

## 2019-07-19 MED ORDER — ADULT MULTIVITAMIN W/MINERALS CH
1.0000 | ORAL_TABLET | Freq: Every day | ORAL | Status: DC
  Administered 2019-07-19 – 2019-07-20 (×2): 1 via ORAL
  Filled 2019-07-19 (×2): qty 1

## 2019-07-19 MED ORDER — DULOXETINE HCL 60 MG PO CPEP: 60.0000 mg | ORAL_CAPSULE | Freq: Every day | ORAL | 0 refills | Status: DC

## 2019-07-19 MED ORDER — DULOXETINE HCL 30 MG PO CPEP
60.0000 mg | ORAL_CAPSULE | Freq: Every day | ORAL | Status: DC
  Administered 2019-07-19 – 2019-07-20 (×2): 60 mg via ORAL
  Filled 2019-07-19 (×2): qty 2

## 2019-07-19 MED ORDER — THIAMINE HCL 100 MG PO TABS
100.0000 mg | ORAL_TABLET | Freq: Every day | ORAL | Status: DC
  Administered 2019-07-20: 100 mg via ORAL
  Filled 2019-07-19: qty 1

## 2019-07-19 MED ORDER — LOPERAMIDE HCL 2 MG PO CAPS
2.0000 mg | ORAL_CAPSULE | ORAL | Status: DC | PRN
  Filled 2019-07-19: qty 2

## 2019-07-19 NOTE — ED Notes (Signed)
Patient given meal tray.  Patient sitting up feeding herself.   Visitor given food and recliner to sit in.

## 2019-07-19 NOTE — ED Notes (Signed)
Pt woke up. Pt is A&Ox2, does not know where she is or why she is in the hospital. Pt verbalized understanding that she is in the hospital. Pt's son is at bedside. PA made aware of the change in pt's status.
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
## 2019-07-19 NOTE — BH Assessment (Signed)
[facility] Assessment Progress Note  Per Shuvon Rankin, FNP, this pt requires psychiatric hospitalization at this time.  Pt presents under IVC initiated by pt's son, which Nelly Rout, MD has upheld.  At 16:08 Delaney Meigs calls from [facility] to report that pt has been accepted to their facility by Dr Estill Cotta; they will be ready to receive pt at the [facility] campus tomorrow, 07/20/2019 after 07:00.  Shuvon concurs with this decision.  Pt's nurse, Diane, has been notified. Please call report to [phone].  Pt is to be transported via [facility].  Doylene Canning Behavioral Health Coordinator [phone]

## 2019-07-19 NOTE — ED Triage Notes (Signed)
Pt arrived via EMS. Pt states she took Xanax. EMS reports that she also took hydromorphone and that there were several other medications at the scene. EMS reports that pt was breathing but that she was non-responsive on the scene, and that her pupils were not quite pin point, but they were small before they administered narcan. EMS gave 2mg  total of narcan.

## 2019-07-19 NOTE — Consult Note (Signed)
  Kiara Murillo, 39 y.o., female patient seen via tele psych by TTS, this provider, and Dr. Lucianne Muss; her chart reviewed on 07/19/19.  On evaluation Kiara Murillo reports there has been several incidents of accidental drug overdoses.  Patient is prescribed opiates for pain and benzodiazepine for anxiety.  Discussed that those medications in combination increases the risk of overdose by CNS depression and would be better if she came off of those medications and started on something that could help with pain, depression, and anxiety.  Patient did not state if overdose was accidental or intentional.   During evaluation Kiara Murillo is alert/oriented x 4; calm/cooperative; and mood is congruent with affect. She does not appear to be responding to internal/external stimuli or delusional thoughts.  Patient denies suicidal/self-harm/homicidal ideation, psychosis, and paranoia; but this is one of several occassions of overdose on her medications.  Patient answer ed question appropriately.     Recommendation:  Start Librium detox protocol for benzodiazepine withdrawal; Cymbalta 60 mg daily for anxiety, depression, and pain Discontinued benzodiazapine and opiate medications.    Disposition: Recommend psychiatric Inpatient admission when medically cleared.

## 2019-07-19 NOTE — ED Notes (Signed)
Pt IVC'd by son, paperwork given to the secretary.

## 2019-07-19 NOTE — Discharge Summary (Signed)
  Patient to be transferred to [facility] 07/20/19 for inpatient psychiatric treatment.     [facility] can accept tomorrow morning.  Discharge orders and EMTALA completed

## 2019-07-19 NOTE — ED Notes (Signed)
ONE BAG OF BELONGINGS PLACED IN LOCKER 30

## 2019-07-19 NOTE — Progress Notes (Addendum)
Received Kiara Murillo at the change of shift asleep in her bed with the sitter at the bedside. She continued to sleep throughout the night without incdent.

## 2019-07-19 NOTE — ED Provider Notes (Signed)
Blood pressure 106/67, pulse 68, temperature 98.4 F (36.9 C), temperature source Oral, resp. rate 15, height 5\' 4"  (1.626 m), weight 62.6 kg, SpO2 96 %.  In short, Kiara Murillo is a 39 y.o. female with a chief complaint of Drug Overdose .  Refer to the original H&P for additional details.  Patient is awake, alert, conversational.  No additional medical observation required.  Patient is medically clear.   40, MD 07/19/19 [phone]

## 2019-07-19 NOTE — ED Provider Notes (Signed)
[facility]-EMERGENCY DEPT Provider Note   CSN: [id number] Arrival date & time: 07/19/19  0020     History No chief complaint on file.   Kiara Murillo is a 39 y.o. female.  Patient to ED via EMS called by SO/husband/roommate after patient was found unconscious. She is reported to have taken "a bunch of medications" by person present onsite. Per EMS, there were prescriptions for hydrocodone at the house. She was reported to be unresponsive by EMS, IV established, EKG unremarkable, was given Narcan (total of 2 mg) with return to consciousness. Patient is groggy but able to answer questions on arrival and is oriented. She reports taking Xanax and Soma. She denies SI or suicide attempt. No vomiting. She denies pain.   The history is provided by the patient and the EMS personnel. No language interpreter was used.       Past Medical History:  Diagnosis Date  . Anxiety   . Chondromalacia of patella   . Chronic pain syndrome   . Chronic pelvic pain in female   . Congenital spondylolisthesis   . Congenital spondylolisthesis   . Depression   . Headache(784.0) hemiplegic migraine  . Low back pain   . Lumbar spondylolysis   . Spinal stenosis in cervical region   . Spinal stenosis in cervical region     Patient Active Problem List   Diagnosis Date Noted  . Drug overdose   . Generalized anxiety disorder 05/27/2018  . MDD (major depressive disorder), recurrent episode, severe (HCC) 05/26/2018  . S/P tubal ligation 12/02/2012  . No narcotics to be prescribed by [facility] 01/19/2012  . Severe major depression with psychotic features (HCC) 09/04/2011  . Chronic pain syndrome 09/01/2010  . MIGRAINE, HEMIPLEGIC 11/22/2009  . CHONDROMALACIA OF PATELLA 11/15/2009  . SPONDYLOSIS, LUMBAR 05/16/2009  . LOW BACK PAIN 12/26/2008  . PELVIC PAIN, CHRONIC 11/23/2008  . Post traumatic stress disorder (PTSD) 01/28/2007  . TOBACCO DEPENDENCE 06/17/2006  . CERVICAL SPINE DISORDER, NOS  06/17/2006    Past Surgical History:  Procedure Laterality Date  . CESAREAN SECTION    . MOUTH SURGERY    . TONSILLECTOMY    . TUBAL LIGATION       OB History   No obstetric history on file.     No family history on file.  Social History   Tobacco Use  . Smoking status: Current Every Day Smoker    Packs/day: 1.00    Types: Cigarettes  . Smokeless tobacco: Never Used  Substance Use Topics  . Alcohol use: No  . Drug use: No    Home Medications Prior to Admission medications   Medication Sig Start Date End Date Taking? Authorizing Provider  ALPRAZolam (XANAX) 0.5 MG tablet Take 1 tablet (0.5 mg total) by mouth daily at 12 noon. For anxiety Patient not taking: Reported on 05/15/2019 05/29/18   Aldean Baker, NP  ALPRAZolam Prudy Feeler) 1 MG tablet Take 1 tablet (1 mg total) by mouth 2 (two) times daily. For anxiety Patient not taking: Reported on 05/15/2019 05/29/18   Aldean Baker, NP  aspirin 81 MG chewable tablet Chew 81 mg by mouth daily.    [provider]  clindamycin (CLEOCIN) 300 MG capsule Take 1 capsule (300 mg total) by mouth every 8 (eight) hours. For dental infection Patient not taking: Reported on 05/15/2019 05/29/18   Aldean Baker, NP  gabapentin (NEURONTIN) 100 MG capsule Take 100 mg by mouth 2 (two) times daily.  [provider]  hydrOXYzine (ATARAX/VISTARIL) 25 MG tablet Take 1 tablet (25 mg total) by mouth every 6 (six) hours as needed for anxiety. Patient not taking: Reported on 05/15/2019 05/29/18   Connye Burkitt, NP  ibuprofen (ADVIL,MOTRIN) 600 MG tablet Take 1 tablet (600 mg total) by mouth every 6 (six) hours as needed for moderate pain. (May buy over the counter) Patient not taking: Reported on 05/15/2019 05/29/18   Connye Burkitt, NP  levothyroxine (SYNTHROID) 25 MCG tablet Take 25 mcg by mouth daily before breakfast.    [provider]  methocarbamol (ROBAXIN) 500 MG tablet Take 500 mg by mouth 4 (four) times daily.    [provider]  nicotine polacrilex (NICORETTE) 2 MG gum Take 1 each (2 mg total) by mouth as needed for smoking cessation. Patient not taking: Reported on 05/15/2019 05/29/18   Connye Burkitt, NP  oxycodone (OXY-IR) 5 MG capsule Take 5 mg by mouth every 4 (four) hours.    [provider]  sertraline (ZOLOFT) 50 MG tablet Take 3 tablets (150 mg total) by mouth daily. For mood Patient not taking: Reported on 05/15/2019 05/30/18   Connye Burkitt, NP  traZODone (DESYREL) 100 MG tablet Take 1 tablet (100 mg total) by mouth at bedtime as needed for sleep. Patient not taking: Reported on 05/15/2019 05/29/18   Connye Burkitt, NP    Allergies    Hydrocodone-acetaminophen, Eggs or egg-derived products, Gabapentin, Hydrocodone, Toradol [ketorolac tromethamine], Tramadol, and Metronidazole  Review of Systems   Review of Systems  HENT: Negative.   Respiratory: Negative.  Negative for cough and shortness of breath.   Cardiovascular: Negative.  Negative for chest pain.  Gastrointestinal: Negative.  Negative for nausea and vomiting.  Musculoskeletal: Negative.   Skin: Negative.  Negative for color change and wound.  Neurological: Negative for weakness and headaches.       Altered level of consciousness    Physical Exam Updated Vital Signs There were no vitals taken for this visit.  Physical Exam Vitals and nursing note reviewed.  Constitutional:      Appearance: She is well-developed.  HENT:     Head: Normocephalic and atraumatic.  Eyes:     Extraocular Movements: Extraocular movements intact.     Pupils: Pupils are equal, round, and reactive to light.  Cardiovascular:     Rate and Rhythm: Normal rate and regular rhythm.  Pulmonary:     Effort: Pulmonary effort is normal.     Breath sounds: Normal breath sounds. No wheezing, rhonchi or rales.  Abdominal:     General: Bowel sounds are normal.     Palpations: Abdomen is soft.     Tenderness: There is no abdominal tenderness. There is no  guarding or rebound.  Musculoskeletal:        General: Normal range of motion.     Cervical back: Normal range of motion and neck supple.     Comments: Left LE in CAM walker  Skin:    General: Skin is warm and dry.     Findings: No rash.  Neurological:     Mental Status: She is alert and oriented to person, place, and time.     Sensory: No sensory deficit.     Comments: She is groggy but remains awake. Oriented. Follows command. Delayed movements but coordinated/controlled.      ED Results / Procedures / Treatments   Labs (all labs ordered are listed, but only abnormal results are displayed) Labs  Reviewed - No data to display  EKG None  Radiology No results found.  Procedures Procedures (including critical care time) CRITICAL CARE Performed by: Arnoldo Hooker   Total critical care time: 60 minutes  Critical care time was exclusive of separately billable procedures and treating other patients.  Critical care was necessary to treat or prevent imminent or life-threatening deterioration.  Critical care was time spent personally by me on the following activities: development of treatment plan with patient and/or surrogate as well as nursing, discussions with consultants, evaluation of patient's response to treatment, examination of patient, obtaining history from patient or surrogate, ordering and performing treatments and interventions, ordering and review of laboratory studies, ordering and review of radiographic studies, pulse oximetry and re-evaluation of patient's condition.  Medications Ordered in ED Medications - No data to display  ED Course  I have reviewed the triage vital signs and the nursing notes.  Pertinent labs & imaging results that were available during my care of the patient were reviewed by me and considered in my medical decision making (see chart for details).    MDM Rules/Calculators/A&P                      Patient to ED after being found  unresponsive by SO/boyfriend. Given Narcan by EMS with return to responsiveness. Admits to taking Xanax and Soma. Denies Intentional overdose.   Chart reviewed. Patient has a history of overdose in the past. No evidence trauma. Likely patient has overdosed again and altered mental status is not result of head injury, intracranial bleed, infection/illness. VSS.   2:10 - Patient found to be more difficult to awaken. Minimal response to painful stimuli. VSS, no hypoxia, no hypotension. She is breathing unlabored at a rate of 26. Will continue to observe. If any sign of respiratory depression will given additional Narcan.   4:00 - VSS - no further hypoxia. Blood pressure improved with IVF's. She continues to sleep, continues to be hard to waken but felt stable.   6:00 - Patient's son at bedside. He has taken out IVC petition on her based on self destructive behavior. TTS will be needed once the patient is awake and alert.   She is more easily awakened at this point. VSS. She wakes to voice now but does not stay awake. Will continue to monitor.   7:00 - Patient is felt to have accidentally overdosed with known and documented history of substance abuse causing similar presentations and injury in the past. Patient is considered medically cleared and will require TTS evaluation given that she has been placed under IVC petition by son.   Final Clinical Impression(s) / ED Diagnoses Final diagnoses:  None   1. Overdose 2. Altered mental status secondary to #1  Rx / DC Orders ED Discharge Orders    None       Elpidio Anis, PA-C 07/19/19 [id number]    Devoria Albe, MD 07/19/19 [phone]

## 2019-07-19 NOTE — ED Notes (Signed)
Pt said that she uses a wheel chair and a walker at home and was supposed to start PT this week. Reported this to [facility]. Pt is pleasant.

## 2019-07-19 NOTE — ED Notes (Signed)
Pt woke up again. Pt is still drowsy. Pt is A&Ox2. I explained to the pt that she needs to have a TSS consult once she is more awake. Pt verbalized understanding. Pt states that she is in no pain at this time.

## 2019-07-20 NOTE — Consult Note (Signed)
Received report, patient accepted to [facility] under Dr. Charmayne Sheer and will be transported today via [facility].

## 2020-04-24 ENCOUNTER — Telehealth: Payer: Self-pay | Admitting: Obesity Medicine

## 2020-04-24 ENCOUNTER — Telehealth: Payer: Self-pay

## 2020-04-24 NOTE — Telephone Encounter (Signed)
Patient husband called and stated that Dr. Clent Ridges see's another family member of patient and was told that he would see her as a New Patient, is this ok? CB is [phone]

## 2020-04-24 NOTE — Telephone Encounter (Signed)
error 

## 2020-04-26 NOTE — Telephone Encounter (Signed)
Called and explained message to patient. She said she will inquire again in the future

## 2020-04-26 NOTE — Telephone Encounter (Signed)
Sorry but I am too full now

## 2020-09-09 ENCOUNTER — Other Ambulatory Visit: Payer: Self-pay

## 2020-09-09 ENCOUNTER — Emergency Department (HOSPITAL_BASED_OUTPATIENT_CLINIC_OR_DEPARTMENT_OTHER)
Admission: EM | Admit: 2020-09-09 | Discharge: 2020-09-09 | Disposition: A | Discharge status: Home / Self Care | Payer: Medicaid Other | Attending: Emergency Medicine | Admitting: Emergency Medicine

## 2020-09-09 ENCOUNTER — Encounter (HOSPITAL_BASED_OUTPATIENT_CLINIC_OR_DEPARTMENT_OTHER): Payer: Self-pay | Admitting: *Deleted

## 2020-09-09 DIAGNOSIS — F17210 Nicotine dependence, cigarettes, uncomplicated: Secondary | ICD-10-CM | POA: Insufficient documentation

## 2020-09-09 DIAGNOSIS — M79605 Pain in left leg: Secondary | ICD-10-CM | POA: Diagnosis present

## 2020-09-09 DIAGNOSIS — G8929 Other chronic pain: Secondary | ICD-10-CM | POA: Diagnosis not present

## 2020-09-09 MED ORDER — OXYCODONE-ACETAMINOPHEN 5-325 MG PO TABS
1.0000 | ORAL_TABLET | Freq: Once | ORAL | Status: AC
Start: 2020-09-09 — End: 2020-09-09
  Administered 2020-09-09: 1 via ORAL
  Filled 2020-09-09: qty 1

## 2020-09-09 MED ORDER — ACETAMINOPHEN 325 MG PO TABS
650.0000 mg | ORAL_TABLET | Freq: Once | ORAL | Status: AC
  Administered 2020-09-09: 650 mg via ORAL
  Filled 2020-09-09: qty 2
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
## 2020-09-09 NOTE — ED Triage Notes (Addendum)
C/o chronic  left leg pain x 1 day , pt is out of Roxicodone.

## 2020-09-09 NOTE — Discharge Instructions (Addendum)
Rest ice and elevate your leg.  Take your medications as prescribed.  Please follow-up with your primary care provider tomorrow and your pain medication prescriber in order to get your refills.

## 2021-08-09 ENCOUNTER — Emergency Department (HOSPITAL_BASED_OUTPATIENT_CLINIC_OR_DEPARTMENT_OTHER): Payer: 59

## 2021-08-09 ENCOUNTER — Encounter (HOSPITAL_BASED_OUTPATIENT_CLINIC_OR_DEPARTMENT_OTHER): Payer: Self-pay | Admitting: *Deleted

## 2021-08-09 ENCOUNTER — Other Ambulatory Visit: Payer: Self-pay

## 2021-08-09 ENCOUNTER — Inpatient Hospital Stay (HOSPITAL_BASED_OUTPATIENT_CLINIC_OR_DEPARTMENT_OTHER)
Admission: EM | Admit: 2021-08-09 | Discharge: 2021-08-12 | DRG: 917 | Disposition: A | Discharge status: Home / Self Care | Payer: 59 | Attending: Internal Medicine | Admitting: Internal Medicine

## 2021-08-09 DIAGNOSIS — F411 Generalized anxiety disorder: Secondary | ICD-10-CM | POA: Diagnosis present

## 2021-08-09 DIAGNOSIS — G9340 Encephalopathy, unspecified: Secondary | ICD-10-CM

## 2021-08-09 DIAGNOSIS — Z886 Allergy status to analgesic agent status: Secondary | ICD-10-CM

## 2021-08-09 DIAGNOSIS — J439 Emphysema, unspecified: Secondary | ICD-10-CM | POA: Diagnosis present

## 2021-08-09 DIAGNOSIS — Z885 Allergy status to narcotic agent status: Secondary | ICD-10-CM

## 2021-08-09 DIAGNOSIS — M47816 Spondylosis without myelopathy or radiculopathy, lumbar region: Secondary | ICD-10-CM | POA: Diagnosis present

## 2021-08-09 DIAGNOSIS — R4182 Altered mental status, unspecified: Secondary | ICD-10-CM | POA: Diagnosis present

## 2021-08-09 DIAGNOSIS — Y92410 Unspecified street and highway as the place of occurrence of the external cause: Secondary | ICD-10-CM

## 2021-08-09 DIAGNOSIS — T40711A Poisoning by cannabis, accidental (unintentional), initial encounter: Secondary | ICD-10-CM | POA: Diagnosis present

## 2021-08-09 DIAGNOSIS — G929 Unspecified toxic encephalopathy: Secondary | ICD-10-CM | POA: Diagnosis present

## 2021-08-09 DIAGNOSIS — M25572 Pain in left ankle and joints of left foot: Secondary | ICD-10-CM | POA: Diagnosis present

## 2021-08-09 DIAGNOSIS — F4310 Post-traumatic stress disorder, unspecified: Secondary | ICD-10-CM | POA: Diagnosis present

## 2021-08-09 DIAGNOSIS — T40601A Poisoning by unspecified narcotics, accidental (unintentional), initial encounter: Secondary | ICD-10-CM | POA: Diagnosis present

## 2021-08-09 DIAGNOSIS — F32A Depression, unspecified: Secondary | ICD-10-CM | POA: Diagnosis present

## 2021-08-09 DIAGNOSIS — Z20822 Contact with and (suspected) exposure to covid-19: Secondary | ICD-10-CM | POA: Diagnosis present

## 2021-08-09 DIAGNOSIS — T43621A Poisoning by amphetamines, accidental (unintentional), initial encounter: Secondary | ICD-10-CM | POA: Diagnosis present

## 2021-08-09 DIAGNOSIS — F909 Attention-deficit hyperactivity disorder, unspecified type: Secondary | ICD-10-CM | POA: Diagnosis present

## 2021-08-09 DIAGNOSIS — Q762 Congenital spondylolisthesis: Secondary | ICD-10-CM | POA: Diagnosis not present

## 2021-08-09 DIAGNOSIS — Z79890 Hormone replacement therapy (postmenopausal): Secondary | ICD-10-CM

## 2021-08-09 DIAGNOSIS — Z91012 Allergy to eggs: Secondary | ICD-10-CM

## 2021-08-09 DIAGNOSIS — F1990 Other psychoactive substance use, unspecified, uncomplicated: Secondary | ICD-10-CM

## 2021-08-09 DIAGNOSIS — T424X1A Poisoning by benzodiazepines, accidental (unintentional), initial encounter: Principal | ICD-10-CM | POA: Diagnosis present

## 2021-08-09 DIAGNOSIS — A419 Sepsis, unspecified organism: Secondary | ICD-10-CM | POA: Diagnosis present

## 2021-08-09 DIAGNOSIS — T50911A Poisoning by multiple unspecified drugs, medicaments and biological substances, accidental (unintentional), initial encounter: Secondary | ICD-10-CM | POA: Diagnosis not present

## 2021-08-09 DIAGNOSIS — M4802 Spinal stenosis, cervical region: Secondary | ICD-10-CM | POA: Diagnosis present

## 2021-08-09 DIAGNOSIS — F17200 Nicotine dependence, unspecified, uncomplicated: Secondary | ICD-10-CM | POA: Diagnosis present

## 2021-08-09 DIAGNOSIS — G894 Chronic pain syndrome: Secondary | ICD-10-CM | POA: Diagnosis present

## 2021-08-09 DIAGNOSIS — K760 Fatty (change of) liver, not elsewhere classified: Secondary | ICD-10-CM | POA: Diagnosis present

## 2021-08-09 DIAGNOSIS — R102 Pelvic and perineal pain: Secondary | ICD-10-CM | POA: Diagnosis present

## 2021-08-09 DIAGNOSIS — J449 Chronic obstructive pulmonary disease, unspecified: Secondary | ICD-10-CM | POA: Diagnosis not present

## 2021-08-09 DIAGNOSIS — E039 Hypothyroidism, unspecified: Secondary | ICD-10-CM | POA: Diagnosis present

## 2021-08-09 DIAGNOSIS — F17210 Nicotine dependence, cigarettes, uncomplicated: Secondary | ICD-10-CM | POA: Diagnosis present

## 2021-08-09 DIAGNOSIS — F1910 Other psychoactive substance abuse, uncomplicated: Secondary | ICD-10-CM | POA: Diagnosis not present

## 2021-08-09 DIAGNOSIS — M47817 Spondylosis without myelopathy or radiculopathy, lumbosacral region: Secondary | ICD-10-CM | POA: Diagnosis present

## 2021-08-09 DIAGNOSIS — Z881 Allergy status to other antibiotic agents status: Secondary | ICD-10-CM

## 2021-08-09 DIAGNOSIS — M21962 Unspecified acquired deformity of left lower leg: Secondary | ICD-10-CM | POA: Diagnosis present

## 2021-08-09 DIAGNOSIS — M5382 Other specified dorsopathies, cervical region: Secondary | ICD-10-CM | POA: Diagnosis present

## 2021-08-09 DIAGNOSIS — T68XXXA Hypothermia, initial encounter: Secondary | ICD-10-CM | POA: Diagnosis not present

## 2021-08-09 DIAGNOSIS — Z79899 Other long term (current) drug therapy: Secondary | ICD-10-CM

## 2021-08-09 LAB — COMPREHENSIVE METABOLIC PANEL
ALT: 11 U/L (ref 0–44)
AST: 21 U/L (ref 15–41)
Albumin: 4.7 g/dL (ref 3.5–5.0)
Alkaline Phosphatase: 86 U/L (ref 38–126)
Anion gap: 11 (ref 5–15)
BUN: 23 mg/dL — ABNORMAL HIGH (ref 6–20)
CO2: 23 mmol/L (ref 22–32)
Calcium: 9.5 mg/dL (ref 8.9–10.3)
Chloride: 105 mmol/L (ref 98–111)
Creatinine, Ser: 0.67 mg/dL (ref 0.44–1.00)
GFR, Estimated: >60 mL/min (ref >60)
Glucose, Bld: 94 mg/dL (ref 70–99)
Potassium: 3.4 mmol/L — ABNORMAL LOW (ref 3.5–5.1)
Sodium: 139 mmol/L (ref 135–145)
Total Bilirubin: 0.6 mg/dL (ref 0.3–1.2)
Total Protein: 7.9 g/dL (ref 6.5–8.1)

## 2021-08-09 LAB — RAPID URINE DRUG SCREEN, HOSP PERFORMED
Amphetamines: POSITIVE — AB
Barbiturates: NOT DETECTED
Benzodiazepines: POSITIVE — AB
Cocaine: NOT DETECTED
Opiates: POSITIVE — AB
Tetrahydrocannabinol: POSITIVE — AB

## 2021-08-09 LAB — CBC
HCT: 38 % (ref 36.0–46.0)
Hemoglobin: 12.3 g/dL (ref 12.0–15.0)
MCH: 30.7 pg (ref 26.0–34.0)
MCHC: 32.4 g/dL (ref 30.0–36.0)
MCV: 94.8 fL (ref 80.0–100.0)
Platelets: 327 10*3/uL (ref 150–400)
RBC: 4.01 MIL/uL (ref 3.87–5.11)
RDW: 13.7 % (ref 11.5–15.5)
WBC: 14.1 10*3/uL — ABNORMAL HIGH (ref 4.0–10.5)
nRBC: 0 % (ref 0.0–0.2)

## 2021-08-09 LAB — RESP PANEL BY RT-PCR (FLU A&B, COVID) ARPGX2
Influenza A by PCR: NEGATIVE
Influenza B by PCR: NEGATIVE
SARS Coronavirus 2 by RT PCR: NEGATIVE

## 2021-08-09 LAB — PROTIME-INR
INR: 1 (ref 0.8–1.2)
Prothrombin Time: 13.1 seconds (ref 11.4–15.2)

## 2021-08-09 LAB — ETHANOL: Alcohol, Ethyl (B): <10 mg/dL (ref <10)

## 2021-08-09 LAB — MRSA NEXT GEN BY PCR, NASAL: MRSA by PCR Next Gen: DETECTED — AB

## 2021-08-09 LAB — HCG, QUANTITATIVE, PREGNANCY: hCG, Beta Chain, Quant, S: <1 m[IU]/mL (ref <5)

## 2021-08-09 LAB — GLUCOSE, CAPILLARY: Glucose-Capillary: 99 mg/dL (ref 70–99)

## 2021-08-09 LAB — PREGNANCY, URINE: Preg Test, Ur: NEGATIVE

## 2021-08-09 MED ORDER — NOREPINEPHRINE 4 MG/250ML-% IV SOLN
2.0000 ug/min | INTRAVENOUS | Status: DC
  Administered 2021-08-09: 2 ug/min via INTRAVENOUS
  Filled 2021-08-09: qty 250

## 2021-08-09 MED ORDER — IPRATROPIUM-ALBUTEROL 0.5-2.5 (3) MG/3ML IN SOLN: 3.0000 mL | RESPIRATORY_TRACT | Status: DC | PRN

## 2021-08-09 MED ORDER — LACTATED RINGERS IV BOLUS
1000.0000 mL | Freq: Once | INTRAVENOUS | Status: AC
  Administered 2021-08-09: 1000 mL via INTRAVENOUS

## 2021-08-09 MED ORDER — QUETIAPINE FUMARATE 25 MG PO TABS: 25.0000 mg | ORAL_TABLET | Freq: Every day | ORAL | Status: DC

## 2021-08-09 MED ORDER — GABAPENTIN 300 MG PO CAPS
300.0000 mg | ORAL_CAPSULE | Freq: Three times a day (TID) | ORAL | Status: DC
  Administered 2021-08-09 – 2021-08-12 (×9): 300 mg via ORAL
  Filled 2021-08-09 (×9): qty 1

## 2021-08-09 MED ORDER — POTASSIUM CHLORIDE 20 MEQ PO PACK
40.0000 meq | PACK | Freq: Once | ORAL | Status: AC
  Administered 2021-08-09: 40 meq via ORAL
  Filled 2021-08-09: qty 2

## 2021-08-09 MED ORDER — PIPERACILLIN-TAZOBACTAM 3.375 G IVPB: 3.3750 g | Freq: Once | INTRAVENOUS | Status: DC

## 2021-08-09 MED ORDER — VANCOMYCIN HCL IN DEXTROSE 1-5 GM/200ML-% IV SOLN: 1000.0000 mg | Freq: Once | INTRAVENOUS | Status: DC

## 2021-08-09 MED ORDER — LEVOTHYROXINE SODIUM 25 MCG PO TABS
25.0000 ug | ORAL_TABLET | Freq: Every day | ORAL | Status: DC
  Administered 2021-08-10: 25 ug via ORAL
  Filled 2021-08-09: qty 1

## 2021-08-09 MED ORDER — VANCOMYCIN HCL 750 MG/150ML IV SOLN
750.0000 mg | Freq: Two times a day (BID) | INTRAVENOUS | Status: DC
  Filled 2021-08-09: qty 150

## 2021-08-09 MED ORDER — DULOXETINE HCL 30 MG PO CPEP
60.0000 mg | ORAL_CAPSULE | Freq: Every day | ORAL | Status: DC
Start: 2021-08-09 — End: 2021-08-10
  Administered 2021-08-09 – 2021-08-10 (×2): 60 mg via ORAL
  Filled 2021-08-09 (×2): qty 2

## 2021-08-09 MED ORDER — CHLORHEXIDINE GLUCONATE CLOTH 2 % EX PADS
6.0000 | MEDICATED_PAD | Freq: Every day | CUTANEOUS | Status: DC
  Administered 2021-08-09 – 2021-08-11 (×3): 6 via TOPICAL

## 2021-08-09 MED ORDER — IOHEXOL 300 MG/ML  SOLN
100.0000 mL | Freq: Once | INTRAMUSCULAR | Status: AC | PRN
  Administered 2021-08-09: 100 mL via INTRAVENOUS

## 2021-08-09 MED ORDER — OXYCODONE-ACETAMINOPHEN 5-325 MG PO TABS
2.0000 | ORAL_TABLET | Freq: Four times a day (QID) | ORAL | Status: DC | PRN
Start: 2021-08-09 — End: 2021-08-12
  Administered 2021-08-09 – 2021-08-12 (×8): 2 via ORAL
  Filled 2021-08-09 (×8): qty 2

## 2021-08-09 MED ORDER — PIPERACILLIN-TAZOBACTAM 3.375 G IVPB
3.3750 g | Freq: Three times a day (TID) | INTRAVENOUS | Status: DC
  Filled 2021-08-09: qty 50

## 2021-08-09 MED ORDER — NALOXONE HCL 0.4 MG/ML IJ SOLN
0.4000 mg | Freq: Once | INTRAMUSCULAR | Status: AC
  Administered 2021-08-09: 0.4 mg via INTRAVENOUS
  Filled 2021-08-09: qty 1

## 2021-08-09 MED ORDER — SODIUM CHLORIDE 0.9 % IV BOLUS
1000.0000 mL | Freq: Once | INTRAVENOUS | Status: AC
  Administered 2021-08-09: 1000 mL via INTRAVENOUS

## 2021-08-09 MED ORDER — MUPIROCIN 2 % EX OINT
1.0000 "application " | TOPICAL_OINTMENT | Freq: Two times a day (BID) | CUTANEOUS | Status: DC
  Administered 2021-08-09 – 2021-08-12 (×6): 1 via NASAL
  Filled 2021-08-09 (×4): qty 22

## 2021-08-09 MED ORDER — NALOXONE HCL 2 MG/2ML IJ SOSY
2.0000 mg | PREFILLED_SYRINGE | Freq: Once | INTRAMUSCULAR | Status: AC
  Administered 2021-08-09: 2 mg via INTRAVENOUS
  Filled 2021-08-09: qty 2

## 2021-08-09 MED ORDER — SODIUM CHLORIDE 0.9 % IV SOLN
250.0000 mL | INTRAVENOUS | Status: DC
  Administered 2021-08-09: 250 mL via INTRAVENOUS

## 2021-08-09 MED ORDER — LACTATED RINGERS IV BOLUS
1000.0000 mL | Freq: Once | INTRAVENOUS | Status: AC
Start: 2021-08-09 — End: 2021-08-09
  Administered 2021-08-09: 1000 mL via INTRAVENOUS
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
## 2021-08-09 NOTE — Progress Notes (Signed)
Rings x7 removed from both hands and placed in labeled containers. ?
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
## 2021-08-09 NOTE — Progress Notes (Signed)
An USGPIV (ultrasound guided PIV) has been placed for short-term vasopressor infusion. A correctly placed ivWatch must be used when administering Vasopressors. Should this treatment be needed beyond 72 hours, central line access should be obtained.  It will be the responsibility of the bedside nurse to follow best practice to prevent extravasations.   ?
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
## 2021-08-09 NOTE — ED Notes (Signed)
MD made aware of MAP ?

## 2021-08-09 NOTE — Progress Notes (Signed)
Mental status appears to be improving ? ?Does have a history of chronic pain ?History of hypothyroidism ? ?U-Tox with multiple substances ? ?Low threshold to transfer out of the unit ?
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
## 2021-08-10 LAB — CBC
HCT: 36.3 % (ref 36.0–46.0)
Hemoglobin: 11.5 g/dL — ABNORMAL LOW (ref 12.0–15.0)
MCH: 31.3 pg (ref 26.0–34.0)
MCHC: 31.7 g/dL (ref 30.0–36.0)
MCV: 98.6 fL (ref 80.0–100.0)
Platelets: 264 10*3/uL (ref 150–400)
RBC: 3.68 MIL/uL — ABNORMAL LOW (ref 3.87–5.11)
RDW: 13.8 % (ref 11.5–15.5)
WBC: 8.9 10*3/uL (ref 4.0–10.5)
nRBC: 0 % (ref 0.0–0.2)

## 2021-08-10 LAB — MAGNESIUM: Magnesium: 1.8 mg/dL (ref 1.7–2.4)

## 2021-08-10 LAB — BASIC METABOLIC PANEL
Anion gap: 5 (ref 5–15)
BUN: 14 mg/dL (ref 6–20)
CO2: 23 mmol/L (ref 22–32)
Calcium: 8.3 mg/dL — ABNORMAL LOW (ref 8.9–10.3)
Chloride: 109 mmol/L (ref 98–111)
Creatinine, Ser: 0.5 mg/dL (ref 0.44–1.00)
GFR, Estimated: >60 mL/min (ref >60)
Glucose, Bld: 93 mg/dL (ref 70–99)
Potassium: 3.9 mmol/L (ref 3.5–5.1)
Sodium: 137 mmol/L (ref 135–145)

## 2021-08-10 MED ORDER — AMPHETAMINE-DEXTROAMPHETAMINE 10 MG PO TABS
20.0000 mg | ORAL_TABLET | Freq: Every day | ORAL | Status: DC
  Administered 2021-08-11 – 2021-08-12 (×2): 20 mg via ORAL
  Filled 2021-08-10 (×2): qty 2

## 2021-08-10 MED ORDER — SERTRALINE HCL 100 MG PO TABS
100.0000 mg | ORAL_TABLET | Freq: Every day | ORAL | Status: DC
  Administered 2021-08-10 – 2021-08-12 (×3): 100 mg via ORAL
  Filled 2021-08-10: qty 2
  Filled 2021-08-10 (×2): qty 1

## 2021-08-10 MED ORDER — MAGNESIUM SULFATE 2 GM/50ML IV SOLN
2.0000 g | Freq: Once | INTRAVENOUS | Status: AC
  Administered 2021-08-10: 2 g via INTRAVENOUS
  Filled 2021-08-10: qty 50

## 2021-08-10 MED ORDER — QUETIAPINE FUMARATE 50 MG PO TABS
125.0000 mg | ORAL_TABLET | Freq: Every day | ORAL | Status: DC
  Administered 2021-08-10 – 2021-08-11 (×2): 125 mg via ORAL
  Filled 2021-08-10 (×2): qty 3

## 2021-08-10 MED ORDER — QUETIAPINE FUMARATE 50 MG PO TABS
25.0000 mg | ORAL_TABLET | Freq: Two times a day (BID) | ORAL | Status: DC
  Administered 2021-08-11 – 2021-08-12 (×3): 25 mg via ORAL
  Filled 2021-08-10 (×4): qty 1

## 2021-08-10 MED ORDER — ENOXAPARIN SODIUM 40 MG/0.4ML IJ SOSY
40.0000 mg | PREFILLED_SYRINGE | INTRAMUSCULAR | Status: DC
  Administered 2021-08-10 – 2021-08-11 (×2): 40 mg via SUBCUTANEOUS
  Filled 2021-08-10 (×2): qty 0.4
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
## 2021-08-10 NOTE — Progress Notes (Signed)
[facility] ADULT ICU REPLACEMENT PROTOCOL ? ? ?The patient does apply for the [facility] Adult ICU Electrolyte Replacment Protocol based on the criteria listed below:  ? ?1.Exclusion criteria: TCTS patients, ECMO patients, and Dialysis patients ?2. Is GFR >/= 30 ml/min? Yes.    ?Patient's GFR today is >60 ?3. Is SCr </= 2? Yes.   ?Patient's SCr is 0.50 mg/dL ?4. Did SCr increase >/= 0.5 in 24 hours? No. ?5.Pt's weight >40kg  Yes.   ?6. Abnormal electrolyte(s): Mag  ?7. Electrolytes replaced per protocol ?8.  Call MD STAT for K+ </= 2.5, Phos </= 1, or Mag </= 1 ?Physician:  Roxan Hockey ? ?Kiara Murillo 08/10/2021 5:00 AM  ?

## 2021-08-11 ENCOUNTER — Inpatient Hospital Stay (HOSPITAL_COMMUNITY): Payer: 59

## 2021-08-11 DIAGNOSIS — T50911A Poisoning by multiple unspecified drugs, medicaments and biological substances, accidental (unintentional), initial encounter: Secondary | ICD-10-CM

## 2021-08-11 DIAGNOSIS — F411 Generalized anxiety disorder: Secondary | ICD-10-CM

## 2021-08-11 DIAGNOSIS — T68XXXA Hypothermia, initial encounter: Secondary | ICD-10-CM

## 2021-08-11 NOTE — TOC Progression Note (Signed)
Transition of Care (TOC) - Progression Note  ? ? ?Patient Details  ?Name: Kiara Murillo ?MRN: [id number] ?Date of Birth: April 30, 1980 ? ?Transition of Care (TOC) CM/SW Contact  ?Coralee Pesa, LCSWA ?Phone Number: ?08/11/2021, 1:16 PM ? ?Clinical Narrative:    ?CSW met with pt and MD at bedside. CSW responding to consult regarding family concerns for substance use. At this time pt does not want to go to inpatient rehab. CSW explained outpatient options and pt noted she would consider. TOC will be available for any further needs. ? ? ?  ?  ? ?Expected Discharge Plan and Services ?  ?  ?  ?  ?  ?                ?  ?  ?  ?  ?  ?  ?  ?  ?  ?  ? ? ?Social Determinants of Health (SDOH) Interventions ?  ? ?Readmission Risk Interventions ?   ? View : No data to display.  ?  ?  ?  ? ? ?
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
## 2021-08-11 NOTE — Progress Notes (Signed)
Mobility Specialist Progress Note: ? ? 08/11/21 1014  ?Mobility  ?Activity Ambulated with assistance in hallway;Ambulated with assistance to bathroom  ?Level of Assistance Minimal assist, patient does 75% or more  ?Assistive Device None  ?Distance Ambulated (ft) 20 ft  ?Activity Response Tolerated well  ?$Mobility charge 1 Mobility  ? ?Pt received getting put of bed with bed alarm going off. Pt hopped to BR with contact guard to steady. Left in bed with call bell in reach and all needs met.  ? ?Kiara Murillo ?Mobility Specialist ?Primary Phone [phone] ? ?

## 2021-08-11 NOTE — Discharge Instructions (Addendum)
Follow with Primary care provider in 5-7 days ? ?Please get a complete blood count and chemistry panel checked by your Primary MD at your next visit, and again as instructed by your Primary MD. Please get your medications reviewed and adjusted by your Primary MD. ? ?Please request your Primary MD to go over all Hospital Tests and Procedure/Radiological results at the follow up, please get all Hospital records sent to your Prim MD by signing hospital release before you go home. ? ?In some cases, there will be blood work, cultures and biopsy results pending at the time of your discharge. Please request that your primary care M.D. goes through all the records of your hospital data and follows up on these results. ? ?If you had Pneumonia of Lung problems at the Hospital: ?Please get a 2 view Chest X ray done in 6-8 weeks after hospital discharge or sooner if instructed by your Primary MD. ? ?If you have Congestive Heart Failure: ?Please call your Cardiologist or Primary MD anytime you have any of the following symptoms:  ?1) 3 pound weight gain in 24 hours or 5 pounds in 1 week  ?2) shortness of breath, with or without a dry hacking cough  ?3) swelling in the hands, feet or stomach  ?4) if you have to sleep on extra pillows at night in order to breathe ? ?Follow cardiac low salt diet and 1.5 lit/day fluid restriction. ? ?If you have diabetes ?Accuchecks 4 times/day, Once in AM empty stomach and then before each meal. ?Log in all results and show them to your primary doctor at your next visit. ?If any glucose reading is under 80 or above 300 call your primary MD immediately. ? ?If you have Seizure/Convulsions/Epilepsy: ?Please do not drive, operate heavy machinery, participate in activities at heights or participate in high speed sports until you have seen by Primary MD or a Neurologist and advised to do so again. ?Per [facility] statutes, patients with seizures are not allowed to drive until they have been  seizure-free for six months.  ?Use caution when using heavy equipment or power tools. Avoid working on ladders or at heights. Take showers instead of baths. Ensure the water temperature is not too high on the home water heater. Do not go swimming alone. Do not lock yourself in a room alone (i.e. bathroom). When caring for infants or small children, sit down when holding, feeding, or changing them to minimize risk of injury to the child in the event you have a seizure. Maintain good sleep hygiene. Avoid alcohol.  ? ?If you had Gastrointestinal Bleeding: ?Please ask your Primary MD to check a complete blood count within one week of discharge or at your next visit. Your endoscopic/colonoscopic biopsies that are pending at the time of discharge, will also need to followed by your Primary MD. ? ?Get Medicines reviewed and adjusted. ?Please take all your medications with you for your next visit with your Primary MD ? ?Please request your Primary MD to go over all hospital tests and procedure/radiological results at the follow up, please ask your Primary MD to get all Hospital records sent to his/her office. ? ?If you experience worsening of your admission symptoms, develop shortness of breath, life threatening emergency, suicidal or homicidal thoughts you must seek medical attention immediately by calling 911 or calling your MD immediately  if symptoms less severe. ? ?You must read complete instructions/literature along with all the possible adverse reactions/side effects for all the Medicines you take  and that have been prescribed to you. Take any new Medicines after you have completely understood and accpet all the possible adverse reactions/side effects.  ? ?Do not drive or operate heavy machinery when taking Pain medications.  ? ?Do not take more than prescribed Pain, Sleep and Anxiety Medications ? ?Special Instructions: If you have smoked or chewed Tobacco  in the last 2 yrs please stop smoking, stop any regular  Alcohol  and or any Recreational drug use. ? ?Wear Seat belts while driving. ? ?Please note ?You were cared for by a hospitalist during your hospital stay. If you have any questions about your discharge medications or the care you received while you were in the hospital after you are discharged, you can call the unit and asked to speak with the hospitalist on call if the hospitalist that took care of you is not available. Once you are discharged, your primary care physician will handle any further medical issues. Please note that NO REFILLS for any discharge medications will be authorized once you are discharged, as it is imperative that you return to your primary care physician (or establish a relationship with a primary care physician if you do not have one) for your aftercare needs so that they can reassess your need for medications and monitor your lab values. ? ?You can reach the hospitalist office at phone [phone] or fax [phone] ?  ?If you do not have a primary care physician, you can call [phone] for a physician referral. ? ?Activity: As tolerated with Full fall precautions use walker/cane & assistance as needed ? ?  ?Diet: regular ? ?Disposition Home ? ? ?               Intensive Outpatient Programs ? ?[facility]    [facility] ?[address]     [address] ?[place],  [place]     [place], [place] ?[phone]      [phone] ? ?[facility]   [facility]  ?(Inpatient and outpatient)  [phone] (Suboxone and Methadone) ?[address]           ?[phone]          ? ?[facility]    [facility] - Intensive Outpatient ?[address]     [address] ?[place], [place] [address]     [place], [place]  ?[phone]      [phone] ? ?[facility] (Outpatient, Inpatient, [facility] (Groups and Residental) ?(insurance only) [phone]    [place],  [place]   ?       [phone] ? ?     ?[facility] (for caregivers and family) ?[address] [facility]     [address] ?[place]     [place], [place] ?[phone]      [phone] ? ?Residential Treatment Programs ? ?[facility]  ?[facility](2 years) Residential: 90 days)  [facility] ?[address] [place]      [address] ?[place], [place]     [place], [place] ?[phone]      [phone] or [phone] ? ?[facility]    [facility] ?[address]      [address] ?[place]     [place], [place] ?[phone]      [phone] ? ?[facility]   [facility] ([facility]) ?[address]     [address] ?[place], [place] [id number]     [place], [place] ?[phone]      [phone] ?Admissions: 8am-3pm M-F ? ?[facility]: Residential Program [phone](90 Days)              [facility]: [facility]  ?[place], [place]     [place], [place]  ?[phone]) [phone] or [phone]    (Walk in Hours over the weekend or by referral) ? ? ?[facility]: [facility]:[phone] (for crisis response 24 hours a day)  ?

## 2021-08-11 NOTE — Progress Notes (Signed)
Orthopedic Tech Progress Note ?Patient Details:  ?Kiara Murillo ?Apr 13, 1981 ?[id number] ? ?Ortho Devices ?Type of Ortho Device: CAM walker ?Ortho Device/Splint Location: LLE ?Ortho Device/Splint Interventions: Ordered ?  ?  ? ?Yehoshua Vitelli A Avira Tillison ?08/11/2021, 4:23 PM ? ?

## 2021-08-12 DIAGNOSIS — G894 Chronic pain syndrome: Secondary | ICD-10-CM

## 2021-08-12 NOTE — Plan of Care (Signed)
Goals met ?
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
## 2021-08-14 LAB — CULTURE, BLOOD (ROUTINE X 2)
Culture: NO GROWTH
Culture: NO GROWTH
Special Requests: ADEQUATE
Special Requests: ADEQUATE

## 2021-09-12 ENCOUNTER — Ambulatory Visit: Payer: Managed Care, Other (non HMO) | Admitting: Family Medicine

## 2021-09-17 ENCOUNTER — Ambulatory Visit: Payer: Managed Care, Other (non HMO) | Admitting: Family Medicine

## 2022-03-01 ENCOUNTER — Emergency Department (HOSPITAL_BASED_OUTPATIENT_CLINIC_OR_DEPARTMENT_OTHER)
Admission: EM | Admit: 2022-03-01 | Discharge: 2022-03-01 | Disposition: A | Discharge status: Home / Self Care | Payer: Medicaid Other | Attending: Emergency Medicine | Admitting: Emergency Medicine

## 2022-03-01 ENCOUNTER — Encounter (HOSPITAL_BASED_OUTPATIENT_CLINIC_OR_DEPARTMENT_OTHER): Payer: Self-pay | Admitting: Pediatrics

## 2022-03-01 ENCOUNTER — Encounter (HOSPITAL_COMMUNITY): Payer: Self-pay

## 2022-03-01 ENCOUNTER — Emergency Department (EMERGENCY_DEPARTMENT_HOSPITAL)
Admission: EM | Admit: 2022-03-01 | Discharge: 2022-03-03 | Disposition: A | Discharge status: Home / Self Care | Payer: Medicaid Other | Source: Home / Self Care | Attending: Emergency Medicine | Admitting: Emergency Medicine

## 2022-03-01 ENCOUNTER — Other Ambulatory Visit: Payer: Self-pay

## 2022-03-01 DIAGNOSIS — X838XXA Intentional self-harm by other specified means, initial encounter: Secondary | ICD-10-CM | POA: Insufficient documentation

## 2022-03-01 DIAGNOSIS — T50911A Poisoning by multiple unspecified drugs, medicaments and biological substances, accidental (unintentional), initial encounter: Secondary | ICD-10-CM | POA: Diagnosis present

## 2022-03-01 DIAGNOSIS — Z1152 Encounter for screening for COVID-19: Secondary | ICD-10-CM | POA: Diagnosis not present

## 2022-03-01 DIAGNOSIS — Z7982 Long term (current) use of aspirin: Secondary | ICD-10-CM | POA: Insufficient documentation

## 2022-03-01 DIAGNOSIS — Z20822 Contact with and (suspected) exposure to covid-19: Secondary | ICD-10-CM | POA: Insufficient documentation

## 2022-03-01 DIAGNOSIS — M79604 Pain in right leg: Secondary | ICD-10-CM | POA: Diagnosis not present

## 2022-03-01 DIAGNOSIS — T402X1A Poisoning by other opioids, accidental (unintentional), initial encounter: Secondary | ICD-10-CM | POA: Insufficient documentation

## 2022-03-01 DIAGNOSIS — F29 Unspecified psychosis not due to a substance or known physiological condition: Secondary | ICD-10-CM | POA: Insufficient documentation

## 2022-03-01 DIAGNOSIS — F329 Major depressive disorder, single episode, unspecified: Secondary | ICD-10-CM | POA: Insufficient documentation

## 2022-03-01 DIAGNOSIS — F419 Anxiety disorder, unspecified: Secondary | ICD-10-CM | POA: Insufficient documentation

## 2022-03-01 DIAGNOSIS — T1491XA Suicide attempt, initial encounter: Secondary | ICD-10-CM | POA: Diagnosis present

## 2022-03-01 DIAGNOSIS — T50904A Poisoning by unspecified drugs, medicaments and biological substances, undetermined, initial encounter: Secondary | ICD-10-CM

## 2022-03-01 LAB — COMPREHENSIVE METABOLIC PANEL
ALT: 16 U/L (ref 0–44)
AST: 16 U/L (ref 15–41)
Albumin: 4.2 g/dL (ref 3.5–5.0)
Alkaline Phosphatase: 110 U/L (ref 38–126)
Anion gap: 7 (ref 5–15)
BUN: 14 mg/dL (ref 6–20)
CO2: 26 mmol/L (ref 22–32)
Calcium: 9.1 mg/dL (ref 8.9–10.3)
Chloride: 106 mmol/L (ref 98–111)
Creatinine, Ser: 0.61 mg/dL (ref 0.44–1.00)
GFR, Estimated: >60 mL/min (ref >60)
Glucose, Bld: 86 mg/dL (ref 70–99)
Potassium: 3.3 mmol/L — ABNORMAL LOW (ref 3.5–5.1)
Sodium: 139 mmol/L (ref 135–145)
Total Bilirubin: 0.4 mg/dL (ref 0.3–1.2)
Total Protein: 7.9 g/dL (ref 6.5–8.1)

## 2022-03-01 LAB — CBC WITH DIFFERENTIAL/PLATELET
Abs Immature Granulocytes: 0.06 10*3/uL (ref 0.00–0.07)
Basophils Absolute: 0 10*3/uL (ref 0.0–0.1)
Basophils Relative: 0 %
Eosinophils Absolute: 0.1 10*3/uL (ref 0.0–0.5)
Eosinophils Relative: 0 %
HCT: 34.9 % — ABNORMAL LOW (ref 36.0–46.0)
Hemoglobin: 11.2 g/dL — ABNORMAL LOW (ref 12.0–15.0)
Immature Granulocytes: 0 %
Lymphocytes Relative: 12 %
Lymphs Abs: 1.9 10*3/uL (ref 0.7–4.0)
MCH: 29.7 pg (ref 26.0–34.0)
MCHC: 32.1 g/dL (ref 30.0–36.0)
MCV: 92.6 fL (ref 80.0–100.0)
Monocytes Absolute: 1 10*3/uL (ref 0.1–1.0)
Monocytes Relative: 6 %
Neutro Abs: 12.5 10*3/uL — ABNORMAL HIGH (ref 1.7–7.7)
Neutrophils Relative %: 82 %
Platelets: 249 10*3/uL (ref 150–400)
RBC: 3.77 MIL/uL — ABNORMAL LOW (ref 3.87–5.11)
RDW: 13.7 % (ref 11.5–15.5)
WBC: 15.5 10*3/uL — ABNORMAL HIGH (ref 4.0–10.5)
nRBC: 0 % (ref 0.0–0.2)

## 2022-03-01 LAB — ACETAMINOPHEN LEVEL
Acetaminophen (Tylenol), Serum: <10 ug/mL — ABNORMAL LOW (ref 10–30)
Acetaminophen (Tylenol), Serum: <10 ug/mL — ABNORMAL LOW (ref 10–30)

## 2022-03-01 LAB — CBG MONITORING, ED: Glucose-Capillary: 72 mg/dL (ref 70–99)

## 2022-03-01 LAB — I-STAT BETA HCG BLOOD, ED (MC, WL, AP ONLY): I-stat hCG, quantitative: <5 m[IU]/mL (ref <5)

## 2022-03-01 LAB — SALICYLATE LEVEL: Salicylate Lvl: <7 mg/dL — ABNORMAL LOW (ref 7.0–30.0)

## 2022-03-01 LAB — ETHANOL: Alcohol, Ethyl (B): <10 mg/dL (ref <10)

## 2022-03-01 MED ORDER — QUETIAPINE FUMARATE 50 MG PO TABS
25.0000 mg | ORAL_TABLET | Freq: Three times a day (TID) | ORAL | Status: DC
  Administered 2022-03-02 – 2022-03-03 (×5): 25 mg via ORAL
  Filled 2022-03-01 (×5): qty 1

## 2022-03-01 MED ORDER — QUETIAPINE FUMARATE 100 MG PO TABS
100.0000 mg | ORAL_TABLET | Freq: Every day | ORAL | Status: DC
  Administered 2022-03-01 – 2022-03-02 (×2): 100 mg via ORAL
  Filled 2022-03-01 (×2): qty 1

## 2022-03-01 MED ORDER — QUETIAPINE FUMARATE 50 MG PO TABS: 25.0000 mg | ORAL_TABLET | Freq: Three times a day (TID) | ORAL | Status: DC

## 2022-03-01 MED ORDER — OXYCODONE HCL 5 MG PO TABS
5.0000 mg | ORAL_TABLET | ORAL | Status: DC | PRN
  Administered 2022-03-01 – 2022-03-03 (×5): 5 mg via ORAL
  Filled 2022-03-01 (×5): qty 1

## 2022-03-01 MED ORDER — GABAPENTIN 300 MG PO CAPS
600.0000 mg | ORAL_CAPSULE | Freq: Three times a day (TID) | ORAL | Status: DC
  Administered 2022-03-01 – 2022-03-03 (×5): 600 mg via ORAL
  Filled 2022-03-01 (×5): qty 2

## 2022-03-01 MED ORDER — HYDROXYZINE HCL 10 MG PO TABS: 10.0000 mg | ORAL_TABLET | Freq: Three times a day (TID) | ORAL | 0 refills | Status: AC | PRN

## 2022-03-01 MED ORDER — HYDROXYZINE HCL 10 MG PO TABS
10.0000 mg | ORAL_TABLET | Freq: Once | ORAL | Status: AC
  Administered 2022-03-01: 10 mg via ORAL
  Filled 2022-03-01: qty 1

## 2022-03-01 MED ORDER — ASPIRIN 81 MG PO TBEC
81.0000 mg | DELAYED_RELEASE_TABLET | Freq: Every day | ORAL | Status: DC
  Administered 2022-03-02 – 2022-03-03 (×2): 81 mg via ORAL
  Filled 2022-03-01 (×2): qty 1

## 2022-03-01 MED ORDER — AMOXICILLIN-POT CLAVULANATE 875-125 MG PO TABS
1.0000 | ORAL_TABLET | Freq: Two times a day (BID) | ORAL | Status: DC
  Administered 2022-03-01 – 2022-03-03 (×4): 1 via ORAL
  Filled 2022-03-01 (×4): qty 1

## 2022-03-01 NOTE — ED Notes (Signed)
Patient states that she has a ride ,That she is not driving left leg swelling .limited movement

## 2022-03-01 NOTE — Discharge Instructions (Signed)
Follow-up with your orthopedic about further pain management.  Take Vistaril as needed for any anxiety symptoms.

## 2022-03-01 NOTE — ED Provider Notes (Signed)
[facility]-EMERGENCY DEPT Provider Note   CSN: [id number] Arrival date & time: 03/01/22  1404     History  Chief Complaint  Patient presents with   Drug Overdose    HILARIA TITSWORTH is a 41 y.o. female.  Patient is a 41 year old female who presents as a possible overdose.  She has a history of prior substance use issues, depression and chronic issues with her left ankle.  She had a prior ankle fracture with some nonunion of the tibia.  She developed osteomyelitis and currently has an Ex-Fix on.  She is followed by orthopedics and infectious disease associate with [facility].  She is currently on oxycodone 5 mg tablets.  On chart review she got 150 tablets prescribed on October 31.  She also is on gabapentin.  She was seen this morning at [facility] for anxiety and frustration with her ankle issues.  She at that point was denying suicidal or homicidal ideations.  She was prescribed some Vistaril for anxiety.  She left the ED and was with family members in a car at a [employer] on [address].  They called because she reportedly took an overdose of her medications.  They think that she took about 12 tablets of Xanax per EMS and possibly some oxycodone.  They reports per EMS that she was expressing suicidal ideations.  When EMS arrived, she had seemed alert.  However she became more somnolent and was responsive to painful stimuli only in route.  She she has been maintaining her airway and having normal vital signs.  Patient is very somnolent but will open her eyes and have limited conversations on painful stimuli.  She denies that she took anything.  She denies any suicidal ideations.  When asked her why she is so sleepy, she says is because she did not sleep well last night.  On chart review, I see that she has been admitted twice for presumed overdose with possible benzos/opioids.  She was admitted in April to [facility] health.  She was admitted October 18 for presumed  opioid overdose and required Narcan drip at [facility].       Home Medications Prior to Admission medications   Medication Sig Start Date End Date Taking? Authorizing Provider  amoxicillin-clavulanate (AUGMENTIN) 875-125 MG tablet Take 1 tablet by mouth 2 (two) times daily. 01/21/22  Yes [provider]  aspirin EC 81 MG tablet Take 81 mg by mouth daily. Swallow whole.   Yes [provider]  gabapentin (NEURONTIN) 300 MG capsule Take 600 mg by mouth 3 (three) times daily. 04/16/20  Yes [provider]  oxyCODONE (OXY IR/ROXICODONE) 5 MG immediate release tablet Take 5 mg by mouth every 4 (four) hours as needed for severe pain. 02/17/22 03/19/22 Yes [provider]  QUEtiapine (SEROQUEL) 100 MG tablet Take 100 mg by mouth at bedtime.   Yes [provider]  QUEtiapine (SEROQUEL) 25 MG tablet Take 1 tablet (25 mg total) by mouth 3 (three) times daily. 07/19/19  Yes Rankin, Shuvon B, NP  sertraline (ZOLOFT) 100 MG tablet Take 100 mg by mouth daily. 06/14/20  Yes [provider]  hydrOXYzine (ATARAX) 10 MG tablet Take 1 tablet (10 mg total) by mouth every 8 (eight) hours as needed for anxiety. Patient not taking: Reported on 03/01/2022 03/01/22 03/31/22  Virgina Norfolk, DO  nicotine polacrilex (NICORETTE) 2 MG gum Take 1 each (2 mg total) by mouth as needed for smoking cessation. Patient not taking:  Reported on 05/15/2019 05/29/18   Aldean Baker, NP      Allergies    Acetaminophen, Hydrocodone-acetaminophen, Other, Hydrocodone, Toradol [ketorolac tromethamine], Tramadol, and Metronidazole    Review of Systems   Review of Systems  Unable to perform ROS: Mental status change    Physical Exam Updated Vital Signs BP (!) 130/94   Pulse 93   Temp 98 F (36.7 C)   Resp 16   LMP  (LMP Unknown)   SpO2 97%  Physical Exam  ED Results / Procedures / Treatments   Labs (all labs ordered are listed, but only abnormal results are  displayed) Labs Reviewed  ACETAMINOPHEN LEVEL - Abnormal; Notable for the following components:      Result Value   Acetaminophen (Tylenol), Serum <10 (*)    All other components within normal limits  COMPREHENSIVE METABOLIC PANEL - Abnormal; Notable for the following components:   Potassium 3.3 (*)    All other components within normal limits  SALICYLATE LEVEL - Abnormal; Notable for the following components:   Salicylate Lvl <7.0 (*)    All other components within normal limits  CBC WITH DIFFERENTIAL/PLATELET - Abnormal; Notable for the following components:   WBC 15.5 (*)    RBC 3.77 (*)    Hemoglobin 11.2 (*)    HCT 34.9 (*)    Neutro Abs 12.5 (*)    All other components within normal limits  ACETAMINOPHEN LEVEL - Abnormal; Notable for the following components:   Acetaminophen (Tylenol), Serum <10 (*)    All other components within normal limits  ETHANOL  RAPID URINE DRUG SCREEN, HOSP PERFORMED  I-STAT BETA HCG BLOOD, ED ([facility], [facility], [facility] ONLY)  CBG MONITORING, ED    EKG EKG Interpretation  Date/Time:  Sunday March 01 2022 14:29:05 EST Ventricular Rate:  92 PR Interval:  168 QRS Duration: 91 QT Interval:  354 QTC Calculation: 438 R Axis:   87 Text Interpretation: Sinus rhythm since last tracing no significant change Confirmed by Rolan Bucco [phone]) on 03/01/2022 3:29:33 PM  Radiology No results found.  Procedures Procedures    Medications Ordered in ED Medications - No data to display  ED Course/ Medical Decision Making/ A&P                           Medical Decision Making Amount and/or Complexity of Data Reviewed Labs: ordered.   Patient is a 41 year old female who presents with possible overdose.  On chart review, she has been admitted for similar symptoms recently with overdoses.  Today there was a report by family members that she had been having some suicidal ideations.  She pretty adamantly denies this.  She says that her family is concerned about  her because she has been crying a lot.  Although she says that she would not kill herself because she has grandkids.  She was pretty sleepy on arrival.  She was responsive to painful stimuli.  Labs reviewed and are nonconcerning.  Tylenol and salicylate levels were negative.  EtOH is negative.  UDS is pending.  Patient is medically cleared and awaiting TTS evaluation.  She is voluntary at this time.  Final Clinical Impression(s) / ED Diagnoses Final diagnoses:  Drug overdose of undetermined intent, initial encounter    Rx / DC Orders ED Discharge Orders     None         Rolan Bucco, MD 03/01/22 2214

## 2022-03-01 NOTE — ED Triage Notes (Addendum)
Patient BIB [facility] from a [employer] parking lot. Was seen today at med center for suicide. Had a left foot surgery recently. Patient took unknown amount of 2mg  xanax, and oxycodone. Patient was alert when EMS got there. Patient only responding to painful stimuli.

## 2022-03-01 NOTE — ED Provider Notes (Addendum)
[facility] EMERGENCY DEPARTMENT Provider Note   CSN: [id number] Arrival date & time: 03/01/22  1040     History  Chief Complaint  Patient presents with   Leg Pain    Kiara Murillo is a 41 y.o. female.  Patient here with increased anxiety and frustration about her current right ankle injury and process.  She injured her right ankle recently and currently has hardware externally holding this in place.  She has been on narcotic pain medicine and gabapentin but still having a lot of pain.  She is having a lot of emotional issues with this and anxiety.  However she denies any suicidal homicidal ideation.  She mostly wants help with her anxiety.  She takes clonazepam as needed.  The history is provided by the patient.       Home Medications Prior to Admission medications   Medication Sig Start Date End Date Taking? Authorizing Provider  hydrOXYzine (ATARAX) 10 MG tablet Take 1 tablet (10 mg total) by mouth every 8 (eight) hours as needed for anxiety. 03/01/22 03/31/22 Yes Izayiah Tibbitts, DO  amphetamine-dextroamphetamine (ADDERALL) 20 MG tablet Take 20 mg by mouth daily.    [provider]  clonazePAM (KLONOPIN) 1 MG tablet Take 1 mg by mouth at bedtime as needed for anxiety (sleep).    [provider]  gabapentin (NEURONTIN) 300 MG capsule Take 300 mg by mouth 3 (three) times daily.    [provider]  nicotine polacrilex (NICORETTE) 2 MG gum Take 1 each (2 mg total) by mouth as needed for smoking cessation. Patient not taking: Reported on 05/15/2019 05/29/18   Aldean Baker, NP  Oxycodone HCl 10 MG TABS Take 10 mg by mouth 3 (three) times daily. Filled 06-12-21    [provider]  QUEtiapine (SEROQUEL) 100 MG tablet Take 100 mg by mouth at bedtime.    [provider]  QUEtiapine (SEROQUEL) 25 MG tablet Take 1 tablet (25 mg total) by mouth 3 (three) times daily. 07/19/19   Rankin, Shuvon B, NP  sertraline (ZOLOFT) 100 MG tablet Take  100 mg by mouth daily. 06/14/20   [provider]      Allergies    Hydrocodone-acetaminophen, Hydrocodone, Toradol [ketorolac tromethamine], Tramadol, and Metronidazole    Review of Systems   Review of Systems  Physical Exam Updated Vital Signs BP 120/73 (BP Location: Left Arm)   Pulse 86   Temp (!) 96.9 F (36.1 C)   Resp 18   Ht 5\' 4"  (1.626 m)   Wt 56.7 kg   LMP  (LMP Unknown)   SpO2 100%   BMI 21.46 kg/m  Physical Exam Vitals and nursing note reviewed.  Constitutional:      General: She is not in acute distress.    Appearance: She is well-developed.  HENT:     Head: Normocephalic and atraumatic.  Eyes:     Extraocular Movements: Extraocular movements intact.     Conjunctiva/sclera: Conjunctivae normal.     Pupils: Pupils are equal, round, and reactive to light.  Cardiovascular:     Rate and Rhythm: Normal rate and regular rhythm.     Heart sounds: No murmur heard. Pulmonary:     Effort: Pulmonary effort is normal. No respiratory distress.     Breath sounds: Normal breath sounds.  Abdominal:     Palpations: Abdomen is soft.     Tenderness: There is no abdominal tenderness.  Musculoskeletal:        General: No swelling  or tenderness. Normal range of motion.     Cervical back: Neck supple.     Comments: Patient has external hardware intact in the right lower extremity.  There is no redness or swelling or purulent drainage.  Skin:    General: Skin is warm and dry.     Capillary Refill: Capillary refill takes less than 2 seconds.  Neurological:     General: No focal deficit present.     Mental Status: She is alert.  Psychiatric:        Mood and Affect: Mood normal.     Comments: Patient appears a little bit upset and depressed but she denies suicidal homicidal ideation.     ED Results / Procedures / Treatments   Labs (all labs ordered are listed, but only abnormal results are displayed) Labs Reviewed - No data to  display  EKG None  Radiology No results found.  Procedures Procedures    Medications Ordered in ED Medications  hydrOXYzine (ATARAX) tablet 10 mg (has no administration in time range)    ED Course/ Medical Decision Making/ A&P                           Medical Decision Making Risk Prescription drug management.   [name] is here with chronic pain in the right ankle.  She currently has an ex fix and follow with orthopedics from a recent right ankle injury.  She states that she is taking narcotic pain medicine and gabapentin.  She feels like her anxiety is increased.  But she denies any suicidal or homicidal ideation.  She is really frustrated with this process in her pain.  She needs help with her anxiety.  Overall her hardware appears to be intact.  There is no drainage or infectious process.  She already has an active narcotic prescription.  She is on gabapentin.  She has chronic pain.  I can add Vistaril to help with her anxiety symptoms.  I do not think she has any need for acute psychiatric help at this time.  I strongly encouraged her to follow-up with her orthopedic doctor.  Patient discharged in good condition.  This chart was dictated using voice recognition software.  Despite best efforts to proofread,  errors can occur which can change the documentation meaning.   [place] after patient d/c  After patient had already been discharged family member called.  They are concerned about her drug abuse.  She was adamant about not using drugs to self-harm but to self medicate.  I had a prolonged talk with family member about this.  She did not meet IVC criteria.  Talked with family that I am happy to evaluate her again if she wants to be seen by psychiatry if she is truly suicidal or homicidal but she is adamant that she was not with me on exam.  It seems like she is likely abusing drugs but it is hard to know what that intent is.  This to me seems like this was from  self-medication.  Ultimately I am happy to see her again if she wants to check back in.  I also did refer family to rehab places that she has been especially [facility] as well as behavioral health urgent care.    Final Clinical Impression(s) / ED Diagnoses Final diagnoses:  Right leg pain  Anxiety    Rx / DC Orders ED Discharge Orders  Ordered    hydrOXYzine (ATARAX) 10 MG tablet  Every 8 hours PRN        03/01/22 1128              Lennice Sites, DO 03/01/22 1131    [place], [place], DO 03/01/22 1214

## 2022-03-01 NOTE — ED Notes (Signed)
Poison control updated. No recommendations at this time. EDP informed.

## 2022-03-01 NOTE — ED Triage Notes (Signed)
C/o left lower abdominal pain; s/p sx for hardware removal; c/o severe pain and recurrent falls; denies suicidal ideation when asked (x3) stated "I'm just so fed up with this pain".

## 2022-03-02 DIAGNOSIS — T50911A Poisoning by multiple unspecified drugs, medicaments and biological substances, accidental (unintentional), initial encounter: Secondary | ICD-10-CM

## 2022-03-02 DIAGNOSIS — T1491XA Suicide attempt, initial encounter: Secondary | ICD-10-CM | POA: Diagnosis present

## 2022-03-02 LAB — RAPID URINE DRUG SCREEN, HOSP PERFORMED
Amphetamines: POSITIVE — AB
Barbiturates: NOT DETECTED
Benzodiazepines: POSITIVE — AB
Cocaine: NOT DETECTED
Opiates: NOT DETECTED
Tetrahydrocannabinol: POSITIVE — AB

## 2022-03-02 LAB — RESP PANEL BY RT-PCR (FLU A&B, COVID) ARPGX2
Influenza A by PCR: NEGATIVE
Influenza B by PCR: NEGATIVE
SARS Coronavirus 2 by RT PCR: NEGATIVE

## 2022-03-02 MED ORDER — SERTRALINE HCL 50 MG PO TABS
50.0000 mg | ORAL_TABLET | Freq: Every day | ORAL | Status: DC
  Administered 2022-03-02 – 2022-03-03 (×2): 50 mg via ORAL
  Filled 2022-03-02 (×2): qty 1

## 2022-03-02 NOTE — ED Provider Notes (Signed)
Emergency Medicine Observation Re-evaluation Note  Kiara Murillo is a 41 y.o. female, seen on rounds today.  Pt initially presented to the ED for complaints of Drug Overdose Currently, the patient is resting comfortably.  Physical Exam  BP 106/70   Pulse 85   Temp (!) 97.4 F (36.3 C) (Oral)   Resp (!) 21   LMP  (LMP Unknown)   SpO2 94%  Physical Exam General: Resting comfortably Cardiac: Regular rate Lungs: Satting 94% on room air Psych: Calm  ED Course / MDM  EKG:EKG Interpretation  Date/Time:  Sunday March 01 2022 14:29:05 EST Ventricular Rate:  92 PR Interval:  168 QRS Duration: 91 QT Interval:  354 QTC Calculation: 438 R Axis:   87 Text Interpretation: Sinus rhythm since last tracing no significant change Confirmed by Rolan Bucco [phone]) on 03/01/2022 3:29:33 PM  I have reviewed the labs performed to date as well as medications administered while in observation.  Recent changes in the last 24 hours include medication reconciliation and TTS evaluation working on placement. Voluntary commitment Plan  Current plan is for placement under voluntary.    Mardene Sayer, MD 03/02/22 [phone]

## 2022-03-02 NOTE — Consult Note (Signed)
BH ED ASSESSMENT   Reason for Consult:  Psychiatric evaluation Referring Physician:  ER Physician Patient Identification: Kiara Murillo MRN:  [id number] ED Chief Complaint: Drug overdose, multiple drugs, accidental or unintentional, initial encounter  Diagnosis:  Principal Problem:   Drug overdose, multiple drugs, accidental or unintentional, initial encounter Active Problems:   Suicide attempt [facility])   ED Assessment Time Calculation: Start Time: 1534 Stop Time: 1600 Total Time in Minutes (Assessment Completion): 26   Subjective:   Kiara Murillo is a 41 y.o. female patient admitted with hx significant for depression, intentional and unintentional drug OD, Generalized anxiety disorder and PTSD  brought in last night by EMS who picked her up from [employer] lot after an OD on unknown amount of Xanax  2 mg tablet and Oxycodone.  Patient slept all morning and was just seen for evaluation.  HPI:  Patient has a hard wire to her right injured ankle to hold the ankle together.  Patient denied intentional OD but stated that she was getting tired and frustrated due to the ankle pain.  Patient  denied the OD was a suicide attempt.  Earlier yesterday she was seen at [facility] point  for suicide ideation.  Patient said she wanted to have relief from anxiety and pain and she took the pills.  She also added she did not know she OD on these two medications.  She states she has four children and two grandchildren and would not kill herself.  She admitted feeling depressed and anxious and rated both 8/10 with 10 being severe depression.   Her stressors are due to recent Lung cancer diagnosis for her fiance and the ankle pain.  She is on Disability and is unemployed at this time. Patient does not have outpatient Psychiatrist but her PCP prescribes all of her Psychotropic medications.  She reported one inpatient Psychiatric hospitalization at [facility] in [place].  Today she denied SI/HI/AVH and no paranoia.  She  meets criteria for inpatient Psychiatric hospitalization for safety and stabilization.  She will not be able to come to inpatient Psych unit due to the Hard wire on her ankle/leg.  Psychiatry will continue to see her upstairs in Med/Surg floor daily.  We will resume home medications Sertraline, Seroquel, Hydroxyzine..  UDS is positive for Amphetamine, Benzos and Cannabis.  Past Psychiatric History: Depression, anxiety, intentional and unintentional drug OD, Generalized anxiety disorder and PTSD.  One inpatient Psychiatric hospitalization at [facility].  She does not have outpatient Mental health provider.   Risk to Self or Others: Is the patient at risk to self? Yes Has the patient been a risk to self in the past 6 months? Yes Has the patient been a risk to self within the distant past? Yes Is the patient a risk to others? No Has the patient been a risk to others in the past 6 months? No Has the patient been a risk to others within the distant past? No  [place] Scale:  Flowsheet Row ED from 03/01/2022 in [place]  [facility] Most recent reading at 03/01/2022  2:19 PM ED from 03/01/2022 in [facility] Most recent reading at 03/01/2022 11:08 AM ED from 09/09/2020 in [facility] Most recent reading at 09/09/2020  9:51 PM  C-SSRS RISK CATEGORY No Risk No Risk Error: Question 6 not populated       AIMS:  , , ,  ,   ASAM: ASAM Multidimensional Assessment Summary Dimension 1:  Description  of individual's past and current experiences of substance use and withdrawal:  (n/a) DImension 1:  Acute Intoxication and/or Withdrawal Potential Severity Rating: None Dimension 2:  Description of patient's biomedical conditions and  complications:  (n/a) Dimension 2:  Biomedical Conditions and Complications Severity Rating: None Dimension 3:  Description of emotional, behavioral, or cognitive conditions and complications:   (n/a) Dimension 3:  Emotional, behavioral or cognitive (EBC) conditions and complications severity rating: None Dimension 4:  Description of Readiness to Change criteria:  (n/a) Dimension 4:  Readiness to Change Severity Rating: None Dimension 5:  Relapse, continued use, or continued problem potential critiera description:  (n/a) Dimension 5:  Relapse, continued use, or continued problem potential severity rating: None Dimension 6:  Recovery/Iiving environment criteria description:  (n/a) Dimension 6:  Recovery/living environment severity rating: None ASAM Recommended Level of Treatment:  (n/a)  Substance Abuse:  Alcohol / Drug Use Pain Medications: see MAR Prescriptions: see MAR Over the Counter: see MAR History of alcohol / drug use?: No history of alcohol / drug abuse Longest period of sobriety (when/how long): n/a Negative Consequences of Use:  (n/a) Withdrawal Symptoms:  (n/a)  Past Medical History:  Past Medical History:  Diagnosis Date   Anxiety    Chondromalacia of patella    Chronic pain syndrome    Chronic pelvic pain in female    Congenital spondylolisthesis    Congenital spondylolisthesis    Depression    Headache(784.0) hemiplegic migraine   Low back pain    Lumbar spondylolysis    Spinal stenosis in cervical region    Spinal stenosis in cervical region     Past Surgical History:  Procedure Laterality Date   CESAREAN SECTION     MOUTH SURGERY     TONSILLECTOMY     TUBAL LIGATION     Family History: History reviewed. No pertinent family history. Family Psychiatric  History: Maternal uncle-Bipolar disorder Social History:  Social History   Substance and Sexual Activity  Alcohol Use No     Social History   Substance and Sexual Activity  Drug Use Yes   Frequency: 7.0 times per week   Types: Marijuana   Comment: +THC; +opioids; +benzos    Social History   Socioeconomic History   Marital status: Single    Spouse name: Not on file   Number of  children: Not on file   Years of education: Not on file   Highest education level: Not on file  Occupational History   Occupation: Unemployed  Tobacco Use   Smoking status: Every Day    Packs/day: 1.00    Types: Cigarettes   Smokeless tobacco: Never  Vaping Use   Vaping Use: Never used  Substance and Sexual Activity   Alcohol use: No   Drug use: Yes    Frequency: 7.0 times per week    Types: Marijuana    Comment: +THC; +opioids; +benzos   Sexual activity: Yes    Birth control/protection: Surgical  Other Topics Concern   Not on file  Social History Narrative   Pt lives in [place] with partner.  She is unemployed.  She is not followed by an outpatient provider.   Social Determinants of Health   Financial Resource Strain: Not on file  Food Insecurity: Not on file  Transportation Needs: Not on file  Physical Activity: Not on file  Stress: Not on file  Social Connections: Not on file   Additional Social History:    Allergies:   Allergies  Allergen Reactions   Acetaminophen Hives and Rash   Hydrocodone-Acetaminophen Other (See Comments)    Causes migraines over prolonged treatment use headache    Other Other (See Comments)    Unknown   Hydrocodone Nausea And Vomiting and Other (See Comments)    migraines   Toradol [Ketorolac Tromethamine]     "made me feel depressed"   Tramadol Nausea And Vomiting   Metronidazole Rash    Labs:  Results for orders placed or performed during the hospital encounter of 03/01/22 (from the past 48 hour(s))  CBG monitoring, ED     Status: None   Collection Time: 03/01/22  2:23 PM  Result Value Ref Range   Glucose-Capillary 72 70 - 99 mg/dL    Comment: Glucose reference range applies only to samples taken after fasting for at least 8 hours.  Acetaminophen level     Status: Abnormal   Collection Time: 03/01/22  2:37 PM  Result Value Ref Range   Acetaminophen (Tylenol), Serum <10 (L) 10 - 30 ug/mL    Comment: (NOTE) Therapeutic  concentrations vary significantly. A range of 10-30 ug/mL  may be an effective concentration for many patients. However, some  are best treated at concentrations outside of this range. Acetaminophen concentrations >150 ug/mL at 4 hours after ingestion  and >50 ug/mL at 12 hours after ingestion are often associated with  toxic reactions.  Performed at [facility], [address] [place], [place] [address]   Comprehensive metabolic panel     Status: Abnormal   Collection Time: 03/01/22  2:37 PM  Result Value Ref Range   Sodium 139 135 - 145 mmol/L   Potassium 3.3 (L) 3.5 - 5.1 mmol/L   Chloride 106 98 - 111 mmol/L   CO2 26 22 - 32 mmol/L   Glucose, Bld 86 70 - 99 mg/dL    Comment: Glucose reference range applies only to samples taken after fasting for at least 8 hours.   BUN 14 6 - 20 mg/dL   Creatinine, Ser 6.04 0.44 - 1.00 mg/dL   Calcium 9.1 8.9 - 54.0 mg/dL   Total Protein 7.9 6.5 - 8.1 g/dL   Albumin 4.2 3.5 - 5.0 g/dL   AST 16 15 - 41 U/L   ALT 16 0 - 44 U/L   Alkaline Phosphatase 110 38 - 126 U/L   Total Bilirubin 0.4 0.3 - 1.2 mg/dL   GFR, Estimated >98 >11 mL/min    Comment: (NOTE) Calculated using the CKD-EPI Creatinine Equation (2021)    Anion gap 7 5 - 15    Comment: Performed at [facility], [address], [place], [place] [address]  Salicylate level     Status: Abnormal   Collection Time: 03/01/22  2:37 PM  Result Value Ref Range   Salicylate Lvl <7.0 (L) 7.0 - 30.0 mg/dL    Comment: Performed at [facility], [address], [place], [place] [address]  CBC with Differential     Status: Abnormal   Collection Time: 03/01/22  2:37 PM  Result Value Ref Range   WBC 15.5 (H) 4.0 - 10.5 K/uL   RBC 3.77 (L) 3.87 - 5.11 MIL/uL   Hemoglobin 11.2 (L) 12.0 - 15.0 g/dL   HCT 13.0 (L) 86.5 - 78.4 %   MCV 92.6 80.0 - 100.0 fL   MCH 29.7 26.0 - 34.0 pg   MCHC 32.1 30.0 - 36.0 g/dL   RDW 69.6 29.5 - 28.4 %  Platelets 249 150 - 400 K/uL   nRBC 0.0 0.0 - 0.2 %   Neutrophils Relative % 82 %   Neutro Abs 12.5 (H) 1.7 - 7.7 K/uL   Lymphocytes Relative 12 %   Lymphs Abs 1.9 0.7 - 4.0 K/uL   Monocytes Relative 6 %   Monocytes Absolute 1.0 0.1 - 1.0 K/uL   Eosinophils Relative 0 %   Eosinophils Absolute 0.1 0.0 - 0.5 K/uL   Basophils Relative 0 %   Basophils Absolute 0.0 0.0 - 0.1 K/uL   Immature Granulocytes 0 %   Abs Immature Granulocytes 0.06 0.00 - 0.07 K/uL    Comment: Performed at [facility], [address], [place], [place] [address]  I-Stat beta hCG blood, ED     Status: None   Collection Time: 03/01/22  2:53 PM  Result Value Ref Range   I-stat hCG, quantitative <5.0 <5 mIU/mL   Comment 3            Comment:   GEST. AGE      CONC.  (mIU/mL)   <=1 WEEK        5 - 50     2 WEEKS       50 - 500     3 WEEKS       100 - 10,000     4 WEEKS     1,000 - 30,000        FEMALE AND NON-PREGNANT FEMALE:     LESS THAN 5 mIU/mL   Urine rapid drug screen (hosp performed)     Status: Abnormal   Collection Time: 03/01/22  2:55 PM  Result Value Ref Range   Opiates NONE DETECTED NONE DETECTED   Cocaine NONE DETECTED NONE DETECTED   Benzodiazepines POSITIVE (A) NONE DETECTED   Amphetamines POSITIVE (A) NONE DETECTED   Tetrahydrocannabinol POSITIVE (A) NONE DETECTED   Barbiturates NONE DETECTED NONE DETECTED    Comment: (NOTE) DRUG SCREEN FOR MEDICAL PURPOSES ONLY.  IF CONFIRMATION IS NEEDED FOR ANY PURPOSE, NOTIFY LAB WITHIN 5 DAYS.  LOWEST DETECTABLE LIMITS FOR URINE DRUG SCREEN Drug Class                     Cutoff (ng/mL) Amphetamine and metabolites    1000 Barbiturate and metabolites    200 Benzodiazepine                 200 Opiates and metabolites        300 Cocaine and metabolites        300 THC                            50 Performed at [facility], [address] [place], [place] [address]   Ethanol     Status: None   Collection Time:  03/01/22  5:50 PM  Result Value Ref Range   Alcohol, Ethyl (B) <10 <10 mg/dL    Comment: (NOTE) Lowest detectable limit for serum alcohol is 10 mg/dL.  For medical purposes only. Performed at [facility], [address] [place], [place] [address]   Acetaminophen level     Status: Abnormal   Collection Time: 03/01/22  5:50 PM  Result Value Ref Range   Acetaminophen (Tylenol), Serum <10 (L) 10 - 30 ug/mL    Comment: (NOTE) Therapeutic concentrations vary significantly. A range of 10-30 ug/mL  may be an effective concentration for  many patients. However, some  are best treated at concentrations outside of this range. Acetaminophen concentrations >150 ug/mL at 4 hours after ingestion  and >50 ug/mL at 12 hours after ingestion are often associated with  toxic reactions.  Performed at [facility], [address], [place], [place] [address]   Resp Panel by RT-PCR (Flu A&B, Covid) Anterior Nasal Swab     Status: None   Collection Time: 03/02/22  7:20 AM   Specimen: Anterior Nasal Swab  Result Value Ref Range   SARS Coronavirus 2 by RT PCR NEGATIVE NEGATIVE    Comment: (NOTE) SARS-CoV-2 target nucleic acids are NOT DETECTED.  The SARS-CoV-2 RNA is generally detectable in upper respiratory specimens during the acute phase of infection. The lowest concentration of SARS-CoV-2 viral copies this assay can detect is 138 copies/mL. A negative result does not preclude SARS-Cov-2 infection and should not be used as the sole basis for treatment or other patient management decisions. A negative result may occur with  improper specimen collection/handling, submission of specimen other than nasopharyngeal swab, presence of viral mutation(s) within the areas targeted by this assay, and inadequate number of viral copies(<138 copies/mL). A negative result must be combined with clinical observations, patient history, and epidemiological information. The  expected result is Negative.  Fact Sheet for Patients:  BloggerCourse.com  Fact Sheet for Healthcare Providers:  SeriousBroker.it  This test is no t yet approved or cleared by the [place] FDA and  has been authorized for detection and/or diagnosis of SARS-CoV-2 by FDA under an Emergency Use Authorization (EUA). This EUA will remain  in effect (meaning this test can be used) for the duration of the COVID-19 declaration under Section 564(b)(1) of the Act, 21 U.S.C.section 360bbb-3(b)(1), unless the authorization is terminated  or revoked sooner.       Influenza A by PCR NEGATIVE NEGATIVE   Influenza B by PCR NEGATIVE NEGATIVE    Comment: (NOTE) The Xpert Xpress SARS-CoV-2/FLU/RSV plus assay is intended as an aid in the diagnosis of influenza from Nasopharyngeal swab specimens and should not be used as a sole basis for treatment. Nasal washings and aspirates are unacceptable for Xpert Xpress SARS-CoV-2/FLU/RSV testing.  Fact Sheet for Patients: BloggerCourse.com  Fact Sheet for Healthcare Providers: SeriousBroker.it  This test is not yet approved or cleared by the [place] FDA and has been authorized for detection and/or diagnosis of SARS-CoV-2 by FDA under an Emergency Use Authorization (EUA). This EUA will remain in effect (meaning this test can be used) for the duration of the COVID-19 declaration under Section 564(b)(1) of the Act, 21 U.S.C. section 360bbb-3(b)(1), unless the authorization is terminated or revoked.  Performed at [facility], [address], [place], [place] [address]     Current Facility-Administered Medications  Medication Dose Route Frequency Provider Last Rate Last Admin   amoxicillin-clavulanate (AUGMENTIN) 875-125 MG per tablet 1 tablet  1 tablet Oral BID Rolan Bucco, MD   1 tablet at 03/02/22 0915   aspirin EC  tablet 81 mg  81 mg Oral Daily Rolan Bucco, MD   81 mg at 03/02/22 0915   gabapentin (NEURONTIN) capsule 600 mg  600 mg Oral TID Rolan Bucco, MD   600 mg at 03/02/22 7353   oxyCODONE (Oxy IR/ROXICODONE) immediate release tablet 5 mg  5 mg Oral Q4H PRN Rolan Bucco, MD   5 mg at 03/02/22 1131   QUEtiapine (SEROQUEL) tablet 100 mg  100 mg Oral QHS Rolan Bucco, MD  100 mg at 03/01/22 2308   QUEtiapine (SEROQUEL) tablet 25 mg  25 mg Oral TID WC Rolan Bucco, MD   25 mg at 03/02/22 1130   sertraline (ZOLOFT) tablet 50 mg  50 mg Oral Daily Dahlia Byes C, NP       Current Outpatient Medications  Medication Sig Dispense Refill   amoxicillin-clavulanate (AUGMENTIN) 875-125 MG tablet Take 1 tablet by mouth 2 (two) times daily.     aspirin EC 81 MG tablet Take 81 mg by mouth daily. Swallow whole.     gabapentin (NEURONTIN) 300 MG capsule Take 600 mg by mouth 3 (three) times daily.     oxyCODONE (OXY IR/ROXICODONE) 5 MG immediate release tablet Take 5 mg by mouth every 4 (four) hours as needed for severe pain.     QUEtiapine (SEROQUEL) 100 MG tablet Take 100 mg by mouth at bedtime.     QUEtiapine (SEROQUEL) 25 MG tablet Take 1 tablet (25 mg total) by mouth 3 (three) times daily. 60 tablet 0   sertraline (ZOLOFT) 100 MG tablet Take 100 mg by mouth daily.     hydrOXYzine (ATARAX) 10 MG tablet Take 1 tablet (10 mg total) by mouth every 8 (eight) hours as needed for anxiety. (Patient not taking: Reported on 03/01/2022) 30 tablet 0   nicotine polacrilex (NICORETTE) 2 MG gum Take 1 each (2 mg total) by mouth as needed for smoking cessation. (Patient not taking: Reported on 05/15/2019) 100 tablet 0    Musculoskeletal: Strength & Muscle Tone:  seen lying in stretcher the entire assessment Gait & Station:  in stretcher lying down Patient leans:  see above   Psychiatric Specialty Exam: Presentation  General Appearance:  Casual; Disheveled  Eye Contact: Fleeting  Speech: Clear and  Coherent; Normal Rate  Speech Volume: Decreased  Handedness: Right   Mood and Affect  Mood: Depressed; Anxious  Affect: Congruent   Thought Process  Thought Processes: Coherent; Goal Directed  Descriptions of Associations:Intact  Orientation:Full (Time, Place and Person)  Thought Content:Logical  History of Schizophrenia/Schizoaffective disorder:No  Duration of Psychotic Symptoms:No data recorded Hallucinations:Hallucinations: None  Ideas of Reference:None  Suicidal Thoughts:Suicidal Thoughts: No  Homicidal Thoughts:Homicidal Thoughts: No   Sensorium  Memory: Immediate Good; Recent Fair; Remote Poor  Judgment: Impaired  Insight: Lacking   Executive Functions  Concentration: Fair  Attention Span: Fair  Recall: Fair  Fund of Knowledge: Fair  Language: Good   Psychomotor Activity  Psychomotor Activity: Psychomotor Activity: Normal   Assets  Assets: Manufacturing systems engineer; Housing; Desire for Improvement    Sleep  Sleep: Sleep: Fair   Physical Exam: Physical Exam Vitals and nursing note reviewed.  Constitutional:      Appearance: She is ill-appearing.  HENT:     Head: Normocephalic and atraumatic.     Nose: Nose normal.  Cardiovascular:     Rate and Rhythm: Normal rate and regular rhythm.  Pulmonary:     Effort: Pulmonary effort is normal.  Musculoskeletal:     Cervical back: Normal range of motion.     Right lower leg: Edema present.  Skin:    General: Skin is warm and dry.  Neurological:     Mental Status: She is alert and oriented to person, place, and time.    Review of Systems  Constitutional: Negative.   HENT: Negative.    Eyes: Negative.   Respiratory: Negative.    Cardiovascular: Negative.   Gastrointestinal: Negative.   Genitourinary: Negative.   Musculoskeletal:  Right ankle pain with hard wire in place  Skin: Negative.   Neurological: Negative.   Endo/Heme/Allergies: Negative.    Psychiatric/Behavioral:  Positive for depression, hallucinations and substance abuse. The patient is nervous/anxious.    Blood pressure 111/77, pulse 84, temperature 97.7 F (36.5 C), temperature source Oral, resp. rate 18, SpO2 94 %. There is no height or weight on file to calculate BMI.  Medical Decision Making: Patient meets criteria for inpatient Mental healthcare.  We will follow up patient when transferred to Med/Surg unit because she has Hard wire to Right lower leg for ankle fracture.   Meanwhile her home Psychotropics are resumed.  Problem 1: Suicide attempt-Intentional or accidental OD  Problem 2: Recurrent Major Depressive disorder, severe without  Psychotic features  Problem 3: Generalized anxiety disorder  Disposition:  Admit, Psychiatry will follow patient in the Med/surge unit when admitted .  Earney NavyJosephine C Dashun Borre, NP-PMHNP-BC 03/02/2022 4:09 PM
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
## 2022-03-03 MED ORDER — QUETIAPINE FUMARATE 100 MG PO TABS: 100.0000 mg | ORAL_TABLET | Freq: Every day | ORAL | 0 refills | Status: AC

## 2022-03-03 MED ORDER — SERTRALINE HCL 50 MG PO TABS: 50.0000 mg | ORAL_TABLET | Freq: Every day | ORAL | 0 refills | Status: AC

## 2022-03-03 MED ORDER — QUETIAPINE FUMARATE 25 MG PO TABS: 25.0000 mg | ORAL_TABLET | Freq: Three times a day (TID) | ORAL | 0 refills | Status: AC

## 2022-03-03 NOTE — ED Notes (Signed)
Ignore fall risk bundle completed by this user.  Incorrect pt.

## 2022-03-03 NOTE — Discharge Instructions (Signed)
Return for any problem.  ?

## 2022-03-03 NOTE — Progress Notes (Signed)
CSW requested [facility] T[place], RN for [facility]. CSW will assist and follow with placement.  Maryjean Ka, MSW, LCSWA 03/03/2022 12:26 AM

## 2022-03-03 NOTE — Progress Notes (Signed)
Inpatient Behavioral Health Placement  Pt meets inpatient criteria per Earney Navy, NP. There are no available beds at [facility]. Referral was sent to the following facilities;   Destination  Service Provider Address Phone Fax  [facility] [place] [address] [phone]  [facility]  [address] [place] [address] [phone]  [facility]  [address] [place] [address] [phone]  [facility]  [address], [place] [address] [phone]  [facility]  [address] [place] [address] [phone]  [facility]  [address] [place] [address] [phone]  [facility]  [address], [place] [address] [phone]  [facility]  [address] [place] [address] [phone]  [facility]  [address], [place] [address] [phone]  [facility]  [address] [place] [address] [phone]  [facility]  [address] [place] [address] [phone]  [facility]  [address] [place] [address] [phone]    Situation ongoing,  CSW will follow up.   Maryjean Ka, MSW, LCSWA 03/03/2022  @ 8:45 AM

## 2022-03-03 NOTE — Progress Notes (Signed)
TOC consulted to provide outpatient mental health resources and SA resources. TOC signing off.

## 2022-03-03 NOTE — Progress Notes (Signed)
Pt is psych cleared per Earney Navy, NP. This CSW will remove pt from the [facility] shift report. TOC to assist with discharge needs.   Maryjean Ka, MSW, LCSWA 03/03/2022 1:30 PM

## 2022-03-03 NOTE — Discharge Summary (Addendum)
[facility] Psych ED Discharge  03/03/2022 12:47 PM Kiara Murillo  MRN:  [id number]  Principal Problem: Drug overdose, multiple drugs, accidental or unintentional, initial encounter Discharge Diagnoses: Principal Problem:   Drug overdose, multiple drugs, accidental or unintentional, initial encounter Active Problems:   Suicide attempt [facility])  Clinical Impression:  Final diagnoses:  Drug overdose of undetermined intent, initial encounter   Subjective: Kiara Murillo is a 41 y.o. female patient admitted with hx significant for depression, intentional and unintentional drug OD, Generalized anxiety disorder and PTSD  brought in last night by EMS who picked her up from [employer] lot after an OD on unknown amount of Xanax  2 mg tablet and Oxycodone.  Patient slept all morning and was just seen for evaluation. Patient was seen awake, alert and oriented x5.  She reports feeling much better although the ankle and right leg pain is still there.  Patient vehemently denied feeling suicidal when she took the pills and today denies feeling suicidal.  She says she is blessed with five children and two grandchildren that cares about her.  Patient is requesting referral to a Psychiatrist.  We will make referral to [place].  Patient reports better sleeps and rest last night.  She is going home to her family.  We discussed safety plan-take Opiate pain medications only as prescribed.  Call 911, Go to the nearest ER or [place] for any Mental health Crisis or Suicide thought  or ideation.  Patient is Psychiatrically cleared.  She denies SI/HI/AVH.  ED Assessment Time Calculation: Start Time: 1215 Stop Time: 1237 Total Time in Minutes (Assessment Completion): 22   Past Psychiatric History: See initial Psychiatry evaluation  Past Medical History:  Past Medical History:  Diagnosis Date   Anxiety    Chondromalacia of patella    Chronic pain syndrome     Chronic pelvic pain in female    Congenital spondylolisthesis    Congenital spondylolisthesis    Depression    Headache(784.0) hemiplegic migraine   Low back pain    Lumbar spondylolysis    Spinal stenosis in cervical region    Spinal stenosis in cervical region     Past Surgical History:  Procedure Laterality Date   CESAREAN SECTION     MOUTH SURGERY     TONSILLECTOMY     TUBAL LIGATION     Family History: History reviewed. No pertinent family history. Family Psychiatric  History: see initial Psychiatry evaluation note Social History:  Social History   Substance and Sexual Activity  Alcohol Use No     Social History   Substance and Sexual Activity  Drug Use Yes   Frequency: 7.0 times per week   Types: Marijuana   Comment: +THC; +opioids; +benzos    Social History   Socioeconomic History   Marital status: Single    Spouse name: Not on file   Number of children: Not on file   Years of education: Not on file   Highest education level: Not on file  Occupational History   Occupation: Unemployed  Tobacco Use   Smoking status: Every Day    Packs/day: 1.00    Types: Cigarettes   Smokeless tobacco: Never  Vaping Use   Vaping Use: Never used  Substance and Sexual Activity   Alcohol use: No   Drug use: Yes    Frequency: 7.0 times per week    Types: Marijuana    Comment: +THC; +opioids; +benzos  Sexual activity: Yes    Birth control/protection: Surgical  Other Topics Concern   Not on file  Social History Narrative   Pt lives in [place] with partner.  She is unemployed.  She is not followed by an outpatient provider.   Social Determinants of Health   Financial Resource Strain: Not on file  Food Insecurity: Not on file  Transportation Needs: Not on file  Physical Activity: Not on file  Stress: Not on file  Social Connections: Not on file    Tobacco Cessation:  A prescription for an FDA-approved tobacco cessation medication was offered at discharge and  the patient refused  Current Medications: Current Facility-Administered Medications  Medication Dose Route Frequency Provider Last Rate Last Admin   amoxicillin-clavulanate (AUGMENTIN) 875-125 MG per tablet 1 tablet  1 tablet Oral BID Rolan Bucco, MD   1 tablet at 03/03/22 1020   aspirin EC tablet 81 mg  81 mg Oral Daily Rolan Bucco, MD   81 mg at 03/03/22 1020   gabapentin (NEURONTIN) capsule 600 mg  600 mg Oral TID Rolan Bucco, MD   600 mg at 03/03/22 1020   oxyCODONE (Oxy IR/ROXICODONE) immediate release tablet 5 mg  5 mg Oral Q4H PRN Rolan Bucco, MD   5 mg at 03/03/22 0856   QUEtiapine (SEROQUEL) tablet 100 mg  100 mg Oral QHS Prajna Vanderpool C, NP   100 mg at 03/02/22 2249   QUEtiapine (SEROQUEL) tablet 25 mg  25 mg Oral TID WC Rolan Bucco, MD   25 mg at 03/03/22 1212   sertraline (ZOLOFT) tablet 50 mg  50 mg Oral Daily Sanah Kraska C, NP   50 mg at 03/03/22 1020   Current Outpatient Medications  Medication Sig Dispense Refill   amoxicillin-clavulanate (AUGMENTIN) 875-125 MG tablet Take 1 tablet by mouth 2 (two) times daily.     aspirin EC 81 MG tablet Take 81 mg by mouth daily. Swallow whole.     gabapentin (NEURONTIN) 300 MG capsule Take 600 mg by mouth 3 (three) times daily.     oxyCODONE (OXY IR/ROXICODONE) 5 MG immediate release tablet Take 5 mg by mouth every 4 (four) hours as needed for severe pain.     QUEtiapine (SEROQUEL) 100 MG tablet Take 1 tablet (100 mg total) by mouth at bedtime. 30 tablet 0   hydrOXYzine (ATARAX) 10 MG tablet Take 1 tablet (10 mg total) by mouth every 8 (eight) hours as needed for anxiety. (Patient not taking: Reported on 03/01/2022) 30 tablet 0   nicotine polacrilex (NICORETTE) 2 MG gum Take 1 each (2 mg total) by mouth as needed for smoking cessation. (Patient not taking: Reported on 05/15/2019) 100 tablet 0   QUEtiapine (SEROQUEL) 25 MG tablet Take 1 tablet (25 mg total) by mouth 3 (three) times daily with meals. 90 tablet 0    [START ON 03/04/2022] sertraline (ZOLOFT) 50 MG tablet Take 1 tablet (50 mg total) by mouth daily. 30 tablet 0   PTA Medications: (Not in a hospital admission)   [place] Scale:  Flowsheet Row ED from 03/01/2022 in [facility] Most recent reading at 03/01/2022  2:19 PM ED from 03/01/2022 in [facility] Most recent reading at 03/01/2022 11:08 AM ED from 09/09/2020 in [facility] Most recent reading at 09/09/2020  9:51 PM  C-SSRS RISK CATEGORY No Risk No Risk Error: Question 6 not populated       Musculoskeletal: Strength & Muscle Tone:  in  stretcher lying down-hard wire on right ankle and leg Gait & Station: in  stretcher lying down-hard wire on right ankle and leg Patient leans:  see above  Psychiatric Specialty Exam: Presentation  General Appearance:  Casual  Eye Contact: Good  Speech: Clear and Coherent; Normal Rate  Speech Volume: Normal  Handedness: Right   Mood and Affect  Mood: Anxious; Depressed  Affect: Congruent; Depressed   Thought Process  Thought Processes: Coherent; Goal Directed  Descriptions of Associations:Intact  Orientation:Full (Time, Place and Person)  Thought Content:Logical  History of Schizophrenia/Schizoaffective disorder:No  Duration of Psychotic Symptoms:No data recorded Hallucinations:Hallucinations: None  Ideas of Reference:None  Suicidal Thoughts:Suicidal Thoughts: No  Homicidal Thoughts:Homicidal Thoughts: No   Sensorium  Memory: Immediate Good; Recent Good; Remote Good  Judgment: Intact  Insight: Good   Executive Functions  Concentration: Good  Attention Span: Good  Recall: Good  Fund of Knowledge: Good  Language: Good   Psychomotor Activity  Psychomotor Activity: Psychomotor Activity: Normal   Assets  Assets: Communication Skills; Desire for Improvement; Housing; Social Support   Sleep   Sleep: Sleep: Good    Physical Exam: Physical Exam Constitutional:      Appearance: Normal appearance.  HENT:     Head: Normocephalic and atraumatic.     Nose: Nose normal.  Cardiovascular:     Rate and Rhythm: Normal rate.  Pulmonary:     Effort: Pulmonary effort is normal.  Musculoskeletal:     Cervical back: Normal range of motion.  Skin:    General: Skin is warm and dry.  Neurological:     Mental Status: She is alert and oriented to person, place, and time.    Review of Systems  Constitutional: Negative.   HENT: Negative.    Eyes: Negative.   Respiratory: Negative.    Cardiovascular: Negative.   Skin: Negative.   Neurological: Negative.   Endo/Heme/Allergies: Negative.   Psychiatric/Behavioral:  Positive for depression and substance abuse. The patient is nervous/anxious.    Blood pressure 103/60, pulse 72, temperature 98 F (36.7 C), temperature source Oral, resp. rate 18, SpO2 94 %. There is no height or weight on file to calculate BMI.   Demographic Factors:  Adolescent or young adult, Caucasian, Unemployed, and on disability  Loss Factors: Decrease in vocational status and Decline in physical health  Historical Factors: Prior suicide attempts, Family history of mental illness or substance abuse, and Impulsivity  Risk Reduction Factors:   Responsible for children under 65 years of age, Sense of responsibility to family, Living with another person, especially a relative, and Positive social support  Continued Clinical Symptoms:  Severe Anxiety and/or Agitation Depression:   Comorbid alcohol abuse/dependence Impulsivity  Cognitive Features That Contribute To Risk:  None    Suicide Risk:  Mild:  Suicidal ideation of limited frequency, intensity, duration, and specificity.  There are no identifiable plans, no associated intent, mild dysphoria and related symptoms, good self-control (both objective and subjective assessment), few other risk factors, and  identifiable protective factors, including available and accessible social support.   Follow-up Information     [facility]   Contact information: [address] [place] [address] [phone]                 Plan Of Care/Follow-up recommendations:  Activity:  as tolerated Diet:  Regular  Medical Decision Making: Patient is denying SI/HI/AVH  this morning.  She is depressed and angry due to pain to right leg with  hard wire.  Otherwise she says she is happy with life and her children.  She has been referred to [facility] mental health clinic for referral.  Problem 1: Suicide attempt-Intentional or accidental OD    Problem 2: Recurrent Major Depressive disorder, severe without  Psychotic features    Problem 3: Generalized anxiety disorder   Disposition: Psychiatrically cleared  Delfin Gant, NP-PMHNP-BC 03/03/2022, 12:47 PM

## 2022-03-03 NOTE — ED Provider Notes (Signed)
Emergency Medicine Observation Re-evaluation Note  Kiara Murillo is a 41 y.o. female, seen on rounds today.  Pt initially presented to the ED for complaints of Drug Overdose Currently, the patient is resting comfortably.  Physical Exam  BP 103/60 (BP Location: Right Arm)   Pulse 72   Temp 98 F (36.7 C) (Oral)   Resp 18   LMP  (LMP Unknown)   SpO2 94%  Physical Exam General: NAD   ED Course / MDM  EKG:EKG Interpretation  Date/Time:  Sunday March 01 2022 14:29:05 EST Ventricular Rate:  92 PR Interval:  168 QRS Duration: 91 QT Interval:  354 QTC Calculation: 438 R Axis:   87 Text Interpretation: Sinus rhythm since last tracing no significant change Confirmed by Rolan Bucco [phone]) on 03/01/2022 3:29:33 PM  I have reviewed the labs performed to date as well as medications administered while in observation.  Recent changes in the last 24 hours include no acute events reported.  Plan  Current plan is for placement.    Wynetta Fines, MD 03/03/22 1022

## 2022-03-03 NOTE — ED Notes (Signed)
Patient calling to try and find a ride home/

## 2022-08-30 ENCOUNTER — Emergency Department (HOSPITAL_BASED_OUTPATIENT_CLINIC_OR_DEPARTMENT_OTHER)
Admission: EM | Admit: 2022-08-30 | Discharge: 2022-08-30 | Disposition: A | Discharge status: Home / Self Care | Payer: Medicaid Other | Attending: Emergency Medicine | Admitting: Emergency Medicine

## 2022-08-30 DIAGNOSIS — S60455A Superficial foreign body of left ring finger, initial encounter: Secondary | ICD-10-CM | POA: Diagnosis present

## 2022-08-30 DIAGNOSIS — W4904XA Ring or other jewelry causing external constriction, initial encounter: Secondary | ICD-10-CM | POA: Insufficient documentation

## 2022-08-30 NOTE — ED Notes (Signed)
Pt able to self remove the other 2 rings.

## 2022-08-30 NOTE — ED Notes (Signed)
Ring removed by Trinna Post, NT with assistance from [facility] diamond ring cutter

## 2022-08-30 NOTE — ED Provider Notes (Signed)
[place] EMERGENCY DEPARTMENT AT [facility] Provider Note   CSN: [id number] Arrival date & time: 08/30/22  1501     History  Chief Complaint  Patient presents with   Foreign Body    Kiara Murillo is a 42 y.o. female.  HPI     42yo female with history of depression, TAD, PTSD, left ankle surgery, who presents with concern for ring stuck on finger. Reports they went to urgent care before coming here, they attempted to wrap the finger and other maneuvers that were unsuccessful and painful.  They then attempted to cut the ring off but were unable to do so. Stainless steel ring, behind it are two other rings. Has swelling to finger.  Had the ring on other hand and put it on this had 2 days ago. Yesterday could not remove it. No numbness, just swelling and pain.  Past Medical History:  Diagnosis Date   Anxiety    Chondromalacia of patella    Chronic pain syndrome    Chronic pelvic pain in female    Congenital spondylolisthesis    Congenital spondylolisthesis    Depression    Headache(784.0) hemiplegic migraine   Low back pain    Lumbar spondylolysis    Spinal stenosis in cervical region    Spinal stenosis in cervical region     Past Surgical History:  Procedure Laterality Date   CESAREAN SECTION     MOUTH SURGERY     TONSILLECTOMY     TUBAL LIGATION      Home Medications Prior to Admission medications   Medication Sig Start Date End Date Taking? Authorizing Provider  amoxicillin-clavulanate (AUGMENTIN) 875-125 MG tablet Take 1 tablet by mouth 2 (two) times daily. 01/21/22   [provider]  aspirin EC 81 MG tablet Take 81 mg by mouth daily. Swallow whole.    [provider]  gabapentin (NEURONTIN) 300 MG capsule Take 600 mg by mouth 3 (three) times daily. 04/16/20   [provider]  nicotine polacrilex (NICORETTE) 2 MG gum Take 1 each (2 mg total) by mouth as needed for smoking cessation. Patient not taking: Reported on 05/15/2019  05/29/18   Aldean Baker, NP  QUEtiapine (SEROQUEL) 100 MG tablet Take 1 tablet (100 mg total) by mouth at bedtime. 03/03/22 04/02/22  Earney Navy, NP  QUEtiapine (SEROQUEL) 25 MG tablet Take 1 tablet (25 mg total) by mouth 3 (three) times daily with meals. 03/03/22 04/02/22  Earney Navy, NP  sertraline (ZOLOFT) 50 MG tablet Take 1 tablet (50 mg total) by mouth daily. 03/04/22 04/03/22  Earney Navy, NP      Allergies    Acetaminophen, Hydrocodone-acetaminophen, Other, Hydrocodone, Toradol [ketorolac tromethamine], Tramadol, and Metronidazole    Review of Systems   Review of Systems  Physical Exam Updated Vital Signs BP (!) 142/86   Pulse 92   Temp 98 F (36.7 C)   Resp 17   Ht 5\' 4"  (1.626 m)   Wt 59 kg   SpO2 100%   BMI 22.31 kg/m  Physical Exam Vitals and nursing note reviewed.  Constitutional:      General: She is not in acute distress.    Appearance: Normal appearance. She is not ill-appearing, toxic-appearing or diaphoretic.  HENT:     Head: Normocephalic.  Eyes:     Conjunctiva/sclera: Conjunctivae normal.  Cardiovascular:     Rate and Rhythm: Normal rate and regular rhythm.     Pulses: Normal pulses.  Pulmonary:  Effort: Pulmonary effort is normal. No respiratory distress.  Musculoskeletal:        General: Swelling (left ring finger, stainless steel ring distal to this is welling, normal cap refill, normal coloring, is tender) present. No deformity or signs of injury.     Cervical back: No rigidity.     Comments: External fixator in place left lower ext  Skin:    General: Skin is warm and dry.     Coloration: Skin is not jaundiced or pale.  Neurological:     General: No focal deficit present.     Mental Status: She is alert and oriented to person, place, and time.     ED Results / Procedures / Treatments   Labs (all labs ordered are listed, but only abnormal results are displayed) Labs Reviewed - No data to  display  EKG None  Radiology No results found.  Procedures Procedures    Medications Ordered in ED Medications - No data to display  ED Course/ Medical Decision Making/ A&P                             Medical Decision Making   42yo female with history of depression, TAD, PTSD, left ankle surgery, who presents with concern for ring stuck on finger.  No sign of ischemia at this time.  UC had attempted other methods of removal and at this time she prefers attempt to cut the ring rather than re-attempting other techniques.  Alexander NT used ring cutter to remove ring by making two cuts.  Other two rings were able to be removed by patient.  Normal flexion, extension, cap refill.  Patient discharged in stable condition with understanding of reasons to return.          Final Clinical Impression(s) / ED Diagnoses Final diagnoses:  Ring or other jewelry causing external constriction, initial encounter    Rx / DC Orders ED Discharge Orders     None         Alvira Monday, MD 08/30/22 [phone]

## 2022-08-30 NOTE — ED Triage Notes (Signed)
Rings (3) stuck on LT ring finger

## 2022-08-30 NOTE — ED Notes (Signed)
Pt A&Ox4, verbalized understanding of d/c instructions and follow up care. 

## 2023-01-03 ENCOUNTER — Emergency Department (HOSPITAL_COMMUNITY): Payer: Medicaid Other

## 2023-01-03 ENCOUNTER — Other Ambulatory Visit: Payer: Self-pay

## 2023-01-03 ENCOUNTER — Emergency Department (HOSPITAL_COMMUNITY)
Admission: EM | Admit: 2023-01-03 | Discharge: 2023-01-03 | Disposition: A | Discharge status: Home / Self Care | Payer: Medicaid Other | Attending: Emergency Medicine | Admitting: Emergency Medicine

## 2023-01-03 DIAGNOSIS — R4182 Altered mental status, unspecified: Secondary | ICD-10-CM | POA: Diagnosis present

## 2023-01-03 DIAGNOSIS — Z7982 Long term (current) use of aspirin: Secondary | ICD-10-CM | POA: Insufficient documentation

## 2023-01-03 DIAGNOSIS — F1910 Other psychoactive substance abuse, uncomplicated: Secondary | ICD-10-CM | POA: Diagnosis not present

## 2023-01-03 LAB — URINALYSIS, ROUTINE W REFLEX MICROSCOPIC
Bacteria, UA: NONE SEEN
Bilirubin Urine: NEGATIVE
Glucose, UA: NEGATIVE mg/dL
Hgb urine dipstick: NEGATIVE
Ketones, ur: 5 mg/dL — AB
Nitrite: NEGATIVE
Protein, ur: 30 mg/dL — AB
Specific Gravity, Urine: 1.027 (ref 1.005–1.030)
pH: 5 (ref 5.0–8.0)

## 2023-01-03 LAB — CBC WITH DIFFERENTIAL/PLATELET
Abs Immature Granulocytes: 0.04 10*3/uL (ref 0.00–0.07)
Basophils Absolute: 0 10*3/uL (ref 0.0–0.1)
Basophils Relative: 0 %
Eosinophils Absolute: 0.2 10*3/uL (ref 0.0–0.5)
Eosinophils Relative: 2 %
HCT: 36.3 % (ref 36.0–46.0)
Hemoglobin: 11.6 g/dL — ABNORMAL LOW (ref 12.0–15.0)
Immature Granulocytes: 0 %
Lymphocytes Relative: 26 %
Lymphs Abs: 2.6 10*3/uL (ref 0.7–4.0)
MCH: 29.2 pg (ref 26.0–34.0)
MCHC: 32 g/dL (ref 30.0–36.0)
MCV: 91.4 fL (ref 80.0–100.0)
Monocytes Absolute: 0.8 10*3/uL (ref 0.1–1.0)
Monocytes Relative: 8 %
Neutro Abs: 6.5 10*3/uL (ref 1.7–7.7)
Neutrophils Relative %: 64 %
Platelets: 257 10*3/uL (ref 150–400)
RBC: 3.97 MIL/uL (ref 3.87–5.11)
RDW: 15.1 % (ref 11.5–15.5)
WBC: 10.2 10*3/uL (ref 4.0–10.5)
nRBC: 0 % (ref 0.0–0.2)

## 2023-01-03 LAB — HCG, SERUM, QUALITATIVE: Preg, Serum: NEGATIVE

## 2023-01-03 LAB — COMPREHENSIVE METABOLIC PANEL
ALT: 16 U/L (ref 0–44)
AST: 21 U/L (ref 15–41)
Albumin: 4.6 g/dL (ref 3.5–5.0)
Alkaline Phosphatase: 111 U/L (ref 38–126)
Anion gap: 9 (ref 5–15)
BUN: 27 mg/dL — ABNORMAL HIGH (ref 6–20)
CO2: 21 mmol/L — ABNORMAL LOW (ref 22–32)
Calcium: 9.2 mg/dL (ref 8.9–10.3)
Chloride: 108 mmol/L (ref 98–111)
Creatinine, Ser: 0.65 mg/dL (ref 0.44–1.00)
GFR, Estimated: >60 mL/min (ref >60)
Glucose, Bld: 98 mg/dL (ref 70–99)
Potassium: 3.5 mmol/L (ref 3.5–5.1)
Sodium: 138 mmol/L (ref 135–145)
Total Bilirubin: 0.9 mg/dL (ref 0.3–1.2)
Total Protein: 7.9 g/dL (ref 6.5–8.1)

## 2023-01-03 LAB — ACETAMINOPHEN LEVEL: Acetaminophen (Tylenol), Serum: <10 ug/mL — ABNORMAL LOW (ref 10–30)

## 2023-01-03 LAB — RAPID URINE DRUG SCREEN, HOSP PERFORMED
Amphetamines: POSITIVE — AB
Barbiturates: NOT DETECTED
Benzodiazepines: POSITIVE — AB
Cocaine: NOT DETECTED
Opiates: NOT DETECTED
Tetrahydrocannabinol: POSITIVE — AB

## 2023-01-03 LAB — SALICYLATE LEVEL: Salicylate Lvl: <7 mg/dL — ABNORMAL LOW (ref 7.0–30.0)

## 2023-01-03 LAB — ETHANOL: Alcohol, Ethyl (B): <10 mg/dL (ref <10)

## 2023-01-03 MED ORDER — LACTATED RINGERS IV BOLUS
1000.0000 mL | Freq: Once | INTRAVENOUS | Status: AC
  Administered 2023-01-03: 1000 mL via INTRAVENOUS

## 2023-01-03 MED ORDER — NALOXONE HCL 0.4 MG/ML IJ SOLN
0.4000 mg | Freq: Once | INTRAMUSCULAR | Status: AC
  Administered 2023-01-03: 0.4 mg via INTRAVENOUS
  Filled 2023-01-03: qty 1

## 2023-01-03 NOTE — ED Notes (Signed)
RN attempted to give pt water for po challenge. Pt unable to stay awake for assessment. RN then asked pt to stand to ambulate pt, and pt only mumbled words then fell back asleep. Multiple attempts for both we made while at bedside.

## 2023-01-03 NOTE — ED Notes (Signed)
Pt ambulating around room.

## 2023-01-03 NOTE — ED Triage Notes (Signed)
BIB EMS for possible OD, pt is alert but oriented x1 on arrival. Per EMS bystanders told them she took an unknown number of xanax and another medication. Pt is fidgety and tangential.

## 2023-01-03 NOTE — ED Provider Notes (Signed)
[facility] AT [facility] Provider Note   CSN: [id number] Arrival date & time: 01/03/23  0031     History  No chief complaint on file.   Kiara Murillo is a 42 y.o. female.  Level 5 caveat for altered mental status.  Patient brought in by EMS as possible overdose.  She is disoriented on arrival.  Bystanders reported she took a handful of unknown pills including Xanax.  Did not receive any medications from EMS.  Is confused but only oriented to person on arrival.  Unable to give any history.  No evidence of trauma.  Will not Answer questions and just mumbles.  The history is provided by the patient.       Home Medications Prior to Admission medications   Medication Sig Start Date End Date Taking? Authorizing Provider  amoxicillin-clavulanate (AUGMENTIN) 875-125 MG tablet Take 1 tablet by mouth 2 (two) times daily. 01/21/22   [provider]  aspirin EC 81 MG tablet Take 81 mg by mouth daily. Swallow whole.    [provider]  gabapentin (NEURONTIN) 300 MG capsule Take 600 mg by mouth 3 (three) times daily. 04/16/20   [provider]  nicotine polacrilex (NICORETTE) 2 MG gum Take 1 each (2 mg total) by mouth as needed for smoking cessation. Patient not taking: Reported on 05/15/2019 05/29/18   Aldean Baker, NP  QUEtiapine (SEROQUEL) 100 MG tablet Take 1 tablet (100 mg total) by mouth at bedtime. 03/03/22 04/02/22  Earney Navy, NP  QUEtiapine (SEROQUEL) 25 MG tablet Take 1 tablet (25 mg total) by mouth 3 (three) times daily with meals. 03/03/22 04/02/22  Earney Navy, NP  sertraline (ZOLOFT) 50 MG tablet Take 1 tablet (50 mg total) by mouth daily. 03/04/22 04/03/22  Earney Navy, NP      Allergies    Acetaminophen, Hydrocodone-acetaminophen, Other, Hydrocodone, Toradol [ketorolac tromethamine], Tramadol, and Metronidazole    Review of Systems   Review of Systems  Unable to perform ROS: Mental status  change    Physical Exam Updated Vital Signs BP 103/67 (BP Location: Left Arm)   Pulse 76   Temp 97.7 F (36.5 C) (Oral)   Ht 5\' 4"  (1.626 m)   Wt 59 kg   SpO2 97%   BMI 22.33 kg/m  Physical Exam Vitals and nursing note reviewed.  Constitutional:      General: She is not in acute distress.    Appearance: She is well-developed. She is not ill-appearing.     Comments: Somnolent, arouses to voice, quickly falls back asleep, mumbles some but does not answer questions  HENT:     Head: Normocephalic and atraumatic.     Mouth/Throat:     Pharynx: No oropharyngeal exudate.  Eyes:     Conjunctiva/sclera: Conjunctivae normal.     Pupils: Pupils are equal, round, and reactive to light.  Neck:     Comments: No meningismus. Cardiovascular:     Rate and Rhythm: Normal rate and regular rhythm.     Heart sounds: Normal heart sounds. No murmur heard. Pulmonary:     Effort: Pulmonary effort is normal. No respiratory distress.     Breath sounds: Normal breath sounds.  Abdominal:     Palpations: Abdomen is soft.     Tenderness: There is no abdominal tenderness. There is no guarding or rebound.  Musculoskeletal:        General: No tenderness. Normal range of motion.     Cervical back:  Normal range of motion and neck supple.  Skin:    General: Skin is warm.  Neurological:     Mental Status: She is alert.     Cranial Nerves: No cranial nerve deficit.     Motor: No abnormal muscle tone.     Coordination: Coordination normal.     Comments: Moves all extremities equally.  Follows some commands.  Mumbles but does not speak or answer questions fully.  Psychiatric:        Behavior: Behavior normal.     ED Results / Procedures / Treatments   Labs (all labs ordered are listed, but only abnormal results are displayed) Labs Reviewed  CBC WITH DIFFERENTIAL/PLATELET - Abnormal; Notable for the following components:      Result Value   Hemoglobin 11.6 (*)    All other components within normal  limits  COMPREHENSIVE METABOLIC PANEL - Abnormal; Notable for the following components:   CO2 21 (*)    BUN 27 (*)    All other components within normal limits  ACETAMINOPHEN LEVEL - Abnormal; Notable for the following components:   Acetaminophen (Tylenol), Serum <10 (*)    All other components within normal limits  SALICYLATE LEVEL - Abnormal; Notable for the following components:   Salicylate Lvl <7.0 (*)    All other components within normal limits  URINALYSIS, ROUTINE W REFLEX MICROSCOPIC - Abnormal; Notable for the following components:   APPearance HAZY (*)    Ketones, ur 5 (*)    Protein, ur 30 (*)    Leukocytes,Ua SMALL (*)    All other components within normal limits  RAPID URINE DRUG SCREEN, HOSP PERFORMED - Abnormal; Notable for the following components:   Benzodiazepines POSITIVE (*)    Amphetamines POSITIVE (*)    Tetrahydrocannabinol POSITIVE (*)    All other components within normal limits  ETHANOL  HCG, SERUM, QUALITATIVE    EKG EKG Interpretation Date/Time:  Sunday January 03 2023 01:52:07 EDT Ventricular Rate:  71 PR Interval:  178 QRS Duration:  83 QT Interval:  403 QTC Calculation: 438 R Axis:   83  Text Interpretation: Sinus rhythm RSR' in V1 or V2, probably normal variant Baseline wander in lead(s) V1 No significant change was found Confirmed by Glynn Octave [phone]) on 01/03/2023 1:55:17 AM  Radiology CT Head Wo Contrast  Result Date: 01/03/2023 CLINICAL DATA:  Xanax overdose, altered mental status EXAM: CT HEAD WITHOUT CONTRAST TECHNIQUE: Contiguous axial images were obtained from the base of the skull through the vertex without intravenous contrast. RADIATION DOSE REDUCTION: This exam was performed according to the departmental dose-optimization program which includes automated exposure control, adjustment of the mA and/or kV according to patient size and/or use of iterative reconstruction technique. COMPARISON:  08/09/2021 FINDINGS: Brain: No  evidence of acute infarction, hemorrhage, hydrocephalus, extra-axial collection or mass lesion/mass effect. Vascular: No hyperdense vessel or unexpected calcification. Skull: Normal. Negative for fracture or focal lesion. Sinuses/Orbits: The visualized paranasal sinuses are essentially clear. The mastoid air cells are unopacified. Other: None. IMPRESSION: Normal head CT. Electronically Signed   By: Charline Bills M.D.   On: 01/03/2023 01:54    Procedures Procedures    Medications Ordered in ED Medications  naloxone (NARCAN) injection 0.4 mg (has no administration in time range)  lactated ringers bolus 1,000 mL (has no administration in time range)    ED Course/ Medical Decision Making/ A&P  Medical Decision Making Amount and/or Complexity of Data Reviewed Labs: ordered. Decision-making details documented in ED Course. Radiology: ordered and independent interpretation performed. Decision-making details documented in ED Course. ECG/medicine tests: ordered and independent interpretation performed. Decision-making details documented in ED Course.  Risk Prescription drug management.   Altered mental status likely secondary to some kind of intoxicants.  Vitals are stable.  Patient maintaining airway.  Empiric dose of Narcan given on arrival without much effect.  Will check screening labs, EKG, CT head.  Labs reassuring.  Toxicology labs unremarkable.  Ethanol level negative.  Acetaminophen and salicylate levels negative.  EKG is sinus rhythm.  CT head is negative for acute traumatic pathology.  Suspect likely intoxication secondary to benzodiazepines.  Patient will be allowed to metabolize.  Drug screen positive for benzodiazepines, marijuana and amphetamines.  Patient monitored in the ED until she was sober.  At 6:30 AM, patient is awake and alert.  She is tolerating p.o. and ambulatory.  She is oriented to person and place.  She does not recall  what happened last night and denies taking any pills.  Denies any suicidal thoughts or homicidal thoughts.  Denies hearing any voices.  The rest her workup is reassuring and shows no significant traumatic injury. Drug was positive for benzodiazepines, marijuana and amphetamines as above.  Resources given for substance abuse.  Return precautions discussed.        Final Clinical Impression(s) / ED Diagnoses Final diagnoses:  Polysubstance abuse [facility])    Rx / DC Orders ED Discharge Orders     None         Elden Brucato, Jeannett Senior, MD 01/03/23 [phone]

## 2023-01-03 NOTE — ED Notes (Signed)
Pt given orange juice for PO challenge. Pt is also alert and oriented at this time.

## 2023-01-03 NOTE — Discharge Instructions (Signed)
Stop using illicit drugs.  Follow-up with your primary doctor.  Return to the ED with new or worsening symptoms.
